# Patient Record
Sex: Female | Born: 1970 | Race: White | Hispanic: No | Smoking: Current every day smoker
Health system: Southern US, Community
[De-identification: ages and names within clinical notes are randomized; demographics above are authoritative.]

## PROBLEM LIST (undated history)

## (undated) DIAGNOSIS — E7800 Pure hypercholesterolemia, unspecified: Secondary | ICD-10-CM

## (undated) DIAGNOSIS — E669 Obesity, unspecified: Secondary | ICD-10-CM

## (undated) DIAGNOSIS — E119 Type 2 diabetes mellitus without complications: Secondary | ICD-10-CM

## (undated) DIAGNOSIS — F32A Depression, unspecified: Secondary | ICD-10-CM

## (undated) DIAGNOSIS — F329 Major depressive disorder, single episode, unspecified: Secondary | ICD-10-CM

## (undated) HISTORY — DX: Type 2 diabetes mellitus without complications: E11.9

## (undated) HISTORY — DX: Depression, unspecified: F32.A

## (undated) HISTORY — DX: Obesity, unspecified: E66.9

## (undated) HISTORY — PX: NO PAST SURGERIES: SHX2092

## (undated) HISTORY — DX: Pure hypercholesterolemia, unspecified: E78.00

## (undated) HISTORY — DX: Major depressive disorder, single episode, unspecified: F32.9

---

## 2006-05-04 ENCOUNTER — Ambulatory Visit: Payer: Self-pay | Admitting: Internal Medicine

## 2006-05-18 ENCOUNTER — Ambulatory Visit: Payer: Self-pay

## 2007-08-09 HISTORY — PX: LEEP: SHX91

## 2008-05-05 ENCOUNTER — Ambulatory Visit: Payer: Self-pay | Admitting: Cardiology

## 2008-05-12 ENCOUNTER — Ambulatory Visit: Payer: Self-pay

## 2008-05-12 ENCOUNTER — Encounter: Payer: Self-pay | Admitting: Cardiology

## 2008-05-29 ENCOUNTER — Ambulatory Visit: Payer: Self-pay | Admitting: Cardiology

## 2010-03-02 ENCOUNTER — Ambulatory Visit: Payer: Self-pay | Admitting: Family Medicine

## 2010-03-23 ENCOUNTER — Ambulatory Visit: Payer: Self-pay | Admitting: Family Medicine

## 2010-05-25 DIAGNOSIS — K219 Gastro-esophageal reflux disease without esophagitis: Secondary | ICD-10-CM | POA: Insufficient documentation

## 2010-05-25 DIAGNOSIS — I080 Rheumatic disorders of both mitral and aortic valves: Secondary | ICD-10-CM | POA: Insufficient documentation

## 2010-05-25 DIAGNOSIS — R002 Palpitations: Secondary | ICD-10-CM | POA: Insufficient documentation

## 2010-05-25 DIAGNOSIS — I359 Nonrheumatic aortic valve disorder, unspecified: Secondary | ICD-10-CM | POA: Insufficient documentation

## 2010-05-25 DIAGNOSIS — L659 Nonscarring hair loss, unspecified: Secondary | ICD-10-CM | POA: Insufficient documentation

## 2010-05-25 DIAGNOSIS — M1990 Unspecified osteoarthritis, unspecified site: Secondary | ICD-10-CM

## 2010-05-27 ENCOUNTER — Encounter: Payer: Self-pay | Admitting: Cardiology

## 2010-05-27 ENCOUNTER — Ambulatory Visit: Payer: Self-pay | Admitting: Cardiology

## 2010-08-20 ENCOUNTER — Telehealth (INDEPENDENT_AMBULATORY_CARE_PROVIDER_SITE_OTHER): Payer: Self-pay | Admitting: *Deleted

## 2010-09-07 NOTE — Assessment & Plan Note (Signed)
Summary: [facility]   Visit Type:  2 yr f/u Primary Provider:  Marlou Starks   History of Present Illness: Ms Holly Morales comes in today for a two-year evaluation of her aortic regurgitation and mitral regurgitation.  She is totally asymptomatic. She has been able to keep off 60 and 80 pounds she lost. She exercises on a regular basis. Denies any dyspnea on exertion, chest pain, presyncope or syncope. She's had no edema.  She only smokes when she is drinking. She has developed a much healthier lifestyle than in the past. She is still weight watchers and she teaches an exercise class.  Current Medications (verified): 1)  Birth Controll .... Take As Directed  Allergies (verified): No Known Drug Allergies  Past History:  Past Medical History: Last updated: 05/25/2010 CORONARY ARTERY DISEASE, FAMILY HX (ICD-V17.3) MITRAL REGURGITATION (ICD-396.3) AORTIC REGURGITATION (ICD-424.1) PALPITATIONS (ICD-785.1) GERD (ICD-530.81) ALOPECIA (ICD-704.00) OSTEOARTHRITIS (ICD-715.90)    Family History: Last updated: 05/25/2010  Mother is alive and well at 20.  Father is age 4, has  coronary artery disease and a stent.  Has a brother who is 47, with no known  coronary artery disease.  Social History: Last updated: 05/25/2010  She lives in [place] with her husband.  She does not  have any children.  She works as a Production manager for [employer].  She has a history  of tobacco 1 pack per day x15 years, quit in February 2003.  Alcohol:  Denies.  Review of Systems       negative other than history of present illness  Vital Signs:  Patient profile:   40 year old female Height:      69 inches Weight:      179.12 pounds BMI:     26.55 Pulse rate:   74 / minute Pulse rhythm:   regular BP sitting:   112 / 70  (left arm) Cuff size:   large  Vitals Entered By: Danielle Rankin, CMA (May 27, 2010 4:55 PM)  Physical Exam  General:  Well developed, well nourished, in no acute distress. Head:   normocephalic and atraumatic Eyes:  PERRLA/EOM intact; conjunctiva and lids normal. Neck:  Neck supple, no JVD. No masses, thyromegaly or abnormal cervical nodes. Chest Wall:  no deformities or breast masses noted Lungs:  Clear bilaterally to auscultation and percussion. Heart:  PMI nondisplaced, regular rate and rhythm, normal S1-S2, soft systolic murmur at the apex, no significant diastolic murmur heard along the left sternal border, carotids full without bruits Msk:  Back normal, normal gait. Muscle strength and tone normal. Pulses:  pulses normal in all 4 extremities Extremities:  No clubbing or cyanosis. Neurologic:  Alert and oriented x 3. Skin:  Intact without lesions or rashes. Psych:  Normal affect.   Impression & Recommendations:  Problem # 1:  MITRAL REGURGITATION (ICD-396.3) Assessment Unchanged By exam, this probably been little change. We'll quantitate with echocardiography. Same for the aortic valve. If stable follow up in 2 years. Orders: Echocardiogram (Echo)  Problem # 2:  AORTIC REGURGITATION (ICD-424.1) Assessment: Unchanged  Orders: Echocardiogram (Echo)  Problem # 3:  PALPITATIONS (ICD-785.1) Assessment: Improved  Problem # 4:  CORONARY ARTERY DISEASE, FAMILY HX (ICD-V17.3) Assessment: Unchanged   Patient Instructions: 1)  Your physician recommends that you schedule a follow-up appointment in: 2 years 2)  Your physician has requested that you have an echocardiogram.  Echocardiography is a painless test that uses sound waves to create images of your heart. It provides your doctor with information  about the size and shape of your heart and how well your heart's chambers and valves are working.  This procedure takes approximately one hour. There are no restrictions for this procedure.

## 2010-09-09 NOTE — Progress Notes (Signed)
  Phone Note Outgoing Call   Call placed by: Lisabeth Devoid RN,  August 20, 2010 10:09 AM Call placed to: Patient Summary of Call: ECHO  Follow-up for Phone Call        [facility] reminder to schedule ECHO. Follow-up by: Lisabeth Devoid RN,  August 20, 2010 10:09 AM

## 2010-09-13 ENCOUNTER — Other Ambulatory Visit (HOSPITAL_COMMUNITY): Payer: Self-pay

## 2010-10-20 ENCOUNTER — Emergency Department: Payer: Self-pay | Admitting: Emergency Medicine

## 2010-12-21 NOTE — Assessment & Plan Note (Signed)
[facility]                            CARDIOLOGY OFFICE NOTE   DEBI, COUSIN                          MRN:          [id number]  DATE:05/29/2008                            DOB:          Jan 16, 1971    [name] returns today for followup of her initial visit, May 05, 2008.   A event recorder showed sinus rhythm with palpitations.  She does note  that when she drinks a lot of caffeinated coffee that she does, feel  heart skipping.  She is now decaffed and this is resolved.   We obtained a 2-D echocardiogram which actually showed normal left  ventricular chamber size and no left ventricular hypertrophy.  Her EF is  60-70%.  She did have some mild aortic valve thickening and some mild  aortic valve regurgitation with some mild mitral valve regurgitation.  Her left atrial size was marginally increased at 43 mm.   I have reviewed all the findings with Holly Morales today.  I have made no  changes in her program.  We will see her back in 2 years for followup  visit and echo.  She has done remarkably well with therapeutic lifestyle  choices in her weight loss.  I think she has dedicated this for the  future.     Thomas C. Daleen Squibb, MD, [facility]  Electronically Signed    TCW/MedQ  DD: 05/29/2008  DT: 05/29/2008  Job #: [id number]

## 2010-12-21 NOTE — Assessment & Plan Note (Signed)
[facility]                            CARDIOLOGY OFFICE NOTE   RYVER, ZADROZNY                          MRN:          [id number]  DATE:05/05/2008                            DOB:          01/28/1971    Ms. Charma Mocarski is a delightfully 40 year old white female who comes in  today with palpitations.  She feels like her chest quivers right in the  center.  This can happen at rest or some time it happens with exertion.  It does not wake her from sleep.   She has had no syncope or presyncope.  She has had no chest tightness or  pressure.   She is Maily Debarge' daughter who is a patient of mine.  She saw Dr.  Andee Lineman for Dr. Dossie Arbour on May 04, 2006.  She had a stress Myoview  which showed no ischemia, EF of 66%.  She had a hypertensive blood  pressure response at that time.  Remarkably, since that time she has  dropped from 253 down to 171.  She feels remarkably better and her  exercise tolerance has improved dramatically.   She only takes birth control pills.  She denies any over-the-counter  stimulants.  She does not drink excess caffeine.   She does not smoke.  She does not drink.   PHYSICAL EXAMINATION:  VITAL SIGNS:  Her blood pressure today is 112/82,  her pulse is 72 and regular.  Her weight is 171.  HEENT:  Normocephalic and atraumatic.  PERRLA.  Extraocular movements  are intact.  Sclerae are clear.  Facial symmetry is normal.  Dentition  is satisfactory.  Oral mucosa is normal.  NECK:  Carotid upstrokes are equal bilaterally without bruits.  No JVD.  Thyroid is not enlarged.  Trachea is midline.  LUNGS:  Clear.  HEART:  Regular rate and rhythm.  No gallop.  ABDOMEN:  Soft, good bowel sounds.  No midline bruit.  No hepatomegaly.  EXTREMITIES:  No cyanosis, clubbing, or edema.  Pulses are intact.  NEURO:  Intact.   EKG that is obtained by Dr. Gala Romney was essentially normal with a  normal PR interval, QRS and QTc.   ASSESSMENT  AND PLAN:  1. Intermittent palpitations.  2. Dramatic weight loss within significant increase in functional      capacity.  3. Family history of coronary artery disease.   PLAN:  1. A 2-D echocardiogram to rule out any structural heart problem      including left ventricular hypertrophy.  2. Event recorder.   We will have her come back in 2-3 weeks to discuss this.   She had blood work with Dr. Dossie Arbour this spring.  We will make sure she  has a TSH.     Thomas C. Daleen Squibb, MD, [facility]  Electronically Signed    TCW/MedQ  DD: 05/05/2008  DT: 05/06/2008  Job #: [id number]   cc:   Vonita Moss, M.D.

## 2010-12-24 NOTE — Assessment & Plan Note (Signed)
[facility]                              CARDIOLOGY OFFICE NOTE   Holly, Morales                          MRN:          [id number]  DATE:05/04/2006                            DOB:          March 23, 1971    PRIMARY CARE PHYSICIAN:  Dr. Vonita Moss.   REASON FOR CONSULTATION:  Chest tightness.   PATIENT IDENTIFICATION:  [name] is a delightful 40 year old woman with a  history of anxiety as well as morbid obesity and borderline hypertension,  who presents for further evaluation of chest pain.   She tells me that several years ago she had an episode of severe chest pain.  She had an echocardiogram at that time which showed normal LV function, with  very mild mitral valve prolapse, without significant mitral regurgitation.  She was reassured at that time and told it was likely anxiety.  She did not  have a stress test or cardiac catheterization.  Over the past 2-3 months,  she has had recurrent chest pain.  It is now almost nearly on a daily basis.  She describes this as a chest tightness.  It can come and go any time.  It  can last for hours.  It is not worse with exertion.  It does seem to be  triggered by eating, but also can occur at other times.  She has tried  Pepcid AC with just mild relief.  She does note significant burning and  reflux symptoms.  She also has chronic dyspnea on exertion which has not  changed.  Finally, she also experienced some heart flutters about one time  per week, and it lasted about 10 seconds and disappeared.  There are no  associated symptoms.   REVIEW OF SYMPTOMS:  As per HPI and problem list.  Otherwise, all systems  are normal.   PAST MEDICAL HISTORY:  1. Chest pain.      a.     Echocardiogram in 2003, which was normal.  There was very mild       mitral valve prolapse, without significant mitral regurgitation.  2. Morbid obesity.  3. Gastroesophageal reflux disease.  4. Allergies.  5. Osteoarthritis.  6. Alopecia.   CURRENT MEDICATIONS:  1. Allegra 180 a day.  2. Pepcid AC 1 tablet a day.  3. Glucosamine 1 tablet t.i.d.  4. Apri birth control pills.   ALLERGIES:  No known drug allergies.   SOCIAL HISTORY:  She lives in [place] with her husband.  She does not  have any children.  She works as a Production manager for [employer].  She has a history  of tobacco 1 pack per day x15 years, quit in February 2003.  Alcohol:  Denies.   FAMILY HISTORY:  Mother is alive and well at 3.  Father is age 75, has  coronary artery disease and a stent.  Has a brother who is 25, with no known  coronary artery disease.   PHYSICAL EXAMINATION:  GENERAL:  She is an obese woman in no acute distress.  Respirations are unlabored.  She ambulates around the clinic  without  difficulty.  VITAL SIGNS:  Blood pressure is 142/82, heart rate is 92, weight is 261.  HEENT:  Sclerae anicteric.  EOMI.  There is no xanthelasma.  Mucous  membranes are moist.  NECK:  Supple.  Thick.  Unable to assess JVD.  Carotids are 2+ bilaterally,  without bruits.  There is no lymphadenopathy or thyromegaly.  CARDIAC:  She has distant heart sounds.  She has a regular rate and rhythm,  with no significant murmurs, rubs, or gallops.  LUNGS:  Clear.  There is no pain on palpation of her chest wall.  ABDOMEN:  Obese, nontender, nondistended.  There is no right upper quadrant  or epigastric abnormality.  Good bowel sounds.  No bruits.  No masses.  EXTREMITIES:  Warm, with no cyanosis, clubbing, or edema.  DP pulses are 2+  bilaterally.  There are no rashes or arthralgias.  NEUROLOGIC:  She is alert and oriented x3.  Cranial nerves II-XII are  intact.  Moves all four extremities without difficulties.  Affect is quite  bright.   EKG shows normal sinus rhythm, at a rate of 92.  QRS duration is normal at  84 msec.  There are no ST-T wave changes.   ASSESSMENT AND PLAN:  1. Chest pain.  This is fairly atypical.  I think it is more classic for       gastroesophageal reflux disease.  We will switch her over from Pepcid      [facility] to Protonix 40 mg a day.  Given her risk factors, I do think it is      reasonable to check a Myoview, and we will check a baseline exercise      test on her next week.  2. Palpitations.  I suspect these are just mild atrial dysrhythmias.  We      will get a CardioNet monitor to further evaluation.  3. Prevention.  I had a long talk with her regarding her obesity,      sedentary lifestyle, and hypertension.  I told her that she was at high      risk for the development of sleep apnea, hypertension, and diabetes and      related complications.  I have asked her to consider joining Weight      Watchers as well as beginning a routine exercise program to avoid these      complications.   DISPOSITION:  We will see her back in clinic on a p.r.n. basis.  We will  contact her with the results of her testing.       Bevelyn Buckles. Bensimhon, MD     DRB/MedQ  DD:  05/04/2006  DT:  05/05/2006  Job #:  [id number]   cc:   Vonita Moss, M.D.

## 2011-06-14 ENCOUNTER — Ambulatory Visit: Payer: Self-pay | Admitting: Family Medicine

## 2012-07-26 ENCOUNTER — Ambulatory Visit: Payer: Self-pay | Admitting: Family Medicine

## 2013-09-23 ENCOUNTER — Ambulatory Visit: Payer: Self-pay | Admitting: Family Medicine

## 2014-07-15 ENCOUNTER — Ambulatory Visit: Payer: Self-pay

## 2014-09-24 ENCOUNTER — Ambulatory Visit: Payer: Self-pay | Admitting: Family Medicine

## 2014-10-10 ENCOUNTER — Ambulatory Visit
Admit: 2014-10-10 | Disposition: A | Discharge status: Home / Self Care | Payer: Self-pay | Attending: Family Medicine | Admitting: Family Medicine

## 2014-11-07 ENCOUNTER — Ambulatory Visit
Admit: 2014-11-07 | Disposition: A | Discharge status: Home / Self Care | Payer: Self-pay | Attending: Family Medicine | Admitting: Family Medicine

## 2014-12-09 ENCOUNTER — Ambulatory Visit: Payer: Self-pay | Admitting: Dietician

## 2015-01-02 ENCOUNTER — Encounter: Payer: Self-pay | Admitting: Dietician

## 2015-01-02 ENCOUNTER — Encounter: Payer: BC Managed Care – PPO | Attending: Family Medicine | Admitting: Dietician

## 2015-01-02 VITALS — Ht 69.0 in | Wt 224.0 lb

## 2015-01-02 DIAGNOSIS — E119 Type 2 diabetes mellitus without complications: Secondary | ICD-10-CM | POA: Diagnosis not present

## 2015-01-02 NOTE — Patient Instructions (Signed)
Pt.focus on portion control for evening meal. To limit evening snack to 15-20 gms of carbohydrate. To limit carbohydrate servings to 2-3 per meal with range of 9-10 per day. To walk for exercise 3 days per week for 30 minutes.

## 2015-01-02 NOTE — Progress Notes (Signed)
Medical Nutrition Therapy Follow-up visit:  Time:8:15- 8:45am Visit #:3 ASSESSMENT:  Diagnosis:Type 2 Diabetes, obesity  Current weight:224    Height:59in Medications: See list   Progress and evaluation:Wt. Loss of 1.4 lbs since previous visit 8 weeks ago. Pt. States she has "not done well" regarding her food choices in past month. Her problem areas are larger portions at evening meal as higher fat choices. She is also eating "sweets", usually cookies for evening snack. She doesn't however snack at other times, rarely eats out for evening meal, takes sandwich or leftovers for lunch and drinks mainly water or Crystal Lite. She reports her last HgA1c had decreased from 6.8 to 6.2. She has noticed that most recent FBG's have been in the 140's which she attributes to late night snacking.  Physical activity:none NUTRITION CARE EDUCATION: Weight control: Reviewed portion control and strategies when "out of town" to maintain weight during those times. Discussed how improving diet is a process and stressed how she has lost 1.4 lbs despite resuming some previous habits. Diabetes:  Reviewed 1500 calorie meal plan instructed on at initial visit. Stressed the significance of improved HgA1c and how this can be a motivator to resume positive diet changes. Other lifestyle changes:  benefits of making changes, increasing motivation, readiness for change, identifying habits that need to change, NUTRITIONAL DIAGNOSIS:  NI-5.8.2 Excessive carbohydrate intake As related to large dinner portions and late night snacking.  As evidenced by diet history. INTERVENTION:  Pt. To limit evening meal to 2-3 servings of carbohydrate. To limit evening snack to 15 gms carbohdyrate. To walk for exercise 3 days/week for 30 minutes. EDUCATION MATERIALS GIVEN:  . Goals/ instructions  LEARNER/ who was taught:  . Patient   LEVEL OF UNDERSTANDING: . Partial understanding; needs review/ practice LEARNING  BARRIERS: . None  WILLINGNESS TO LEARN/READINESS FOR CHANGE: . Eager, change in progress MONITORING AND EVALUATION:  Weight, dietary intake Follow-up: Pt. To schedule after her next MD appt. To be scheduled in July or Aug. '16

## 2015-01-21 ENCOUNTER — Other Ambulatory Visit: Payer: Self-pay | Admitting: Family Medicine

## 2015-05-12 ENCOUNTER — Ambulatory Visit (INDEPENDENT_AMBULATORY_CARE_PROVIDER_SITE_OTHER): Payer: BC Managed Care – PPO | Admitting: Family Medicine

## 2015-05-12 ENCOUNTER — Encounter: Payer: Self-pay | Admitting: Family Medicine

## 2015-05-12 VITALS — BP 114/79 | HR 82 | Temp 98.8°F | Ht 66.5 in | Wt 232.0 lb

## 2015-05-12 DIAGNOSIS — E119 Type 2 diabetes mellitus without complications: Secondary | ICD-10-CM | POA: Diagnosis not present

## 2015-05-12 DIAGNOSIS — E1140 Type 2 diabetes mellitus with diabetic neuropathy, unspecified: Secondary | ICD-10-CM | POA: Insufficient documentation

## 2015-05-12 DIAGNOSIS — E669 Obesity, unspecified: Secondary | ICD-10-CM | POA: Insufficient documentation

## 2015-05-12 LAB — MICROALBUMIN, URINE WAIVED
CREATININE, URINE WAIVED: 50 mg/dL (ref 10–300)
MICROALB, UR WAIVED: 10 mg/L (ref 0–19)

## 2015-05-12 LAB — BAYER DCA HB A1C WAIVED: HB A1C (BAYER DCA - WAIVED): 7.2 % — ABNORMAL HIGH (ref <7.0)

## 2015-05-12 MED ORDER — METFORMIN HCL 500 MG PO TABS: 500.0000 mg | ORAL_TABLET | Freq: Two times a day (BID) | ORAL | Status: DC

## 2015-05-12 NOTE — Assessment & Plan Note (Signed)
Discussed diabetes primary treatment with lifestyle weight loss. Of course encouraged to quit smoking Will increase metformin to 500 twice a day.

## 2015-05-12 NOTE — Progress Notes (Signed)
   BP 114/79 mmHg  Pulse 82  Temp(Src) 98.8 F (37.1 C)  Ht 5' 6.5" (1.689 m)  Wt 232 lb (105.235 kg)  BMI 36.89 kg/m2  SpO2 98%  LMP 02/23/2015 (Approximate)   Subjective:    Patient ID: Holly Morales, female    DOB: [birth year], 44 y.o.   MRN: [id number]  HPI: Holly Morales is a 44 y.o. female  Chief Complaint  Patient presents with  . Diabetes   Patient follow-up diabetes has done worse with 8 pounds of weight gain this summer. Patient is been on a see food diet.  Taking metformin without problems noted low blood sugar spells Still has hip bursitis takes ibuprofen 800 mg an hour before bedtime one a day. Still taking birth control pills without problems has had scant periods. Patient still smoking aware of increased risk Relevant past medical, surgical, family and social history reviewed and updated as indicated. Interim medical history since our last visit reviewed. Allergies and medications reviewed and updated.  Review of Systems  Constitutional: Negative.   Respiratory: Negative.   Cardiovascular: Negative.     Per HPI unless specifically indicated above     Objective:    BP 114/79 mmHg  Pulse 82  Temp(Src) 98.8 F (37.1 C)  Ht 5' 6.5" (1.689 m)  Wt 232 lb (105.235 kg)  BMI 36.89 kg/m2  SpO2 98%  LMP 02/23/2015 (Approximate)  Wt Readings from Last 3 Encounters:  05/12/15 232 lb (105.235 kg)  12/11/14 225 lb (102.059 kg)  01/02/15 224 lb (101.606 kg)    Physical Exam  Constitutional: She is oriented to person, place, and time. She appears well-developed and well-nourished. No distress.  HENT:  Head: Normocephalic and atraumatic.  Right Ear: Hearing normal.  Left Ear: Hearing normal.  Nose: Nose normal.  Eyes: Conjunctivae and lids are normal. Right eye exhibits no discharge. Left eye exhibits no discharge. No scleral icterus.  Cardiovascular: Normal rate, regular rhythm and normal heart sounds.   Pulmonary/Chest: Effort normal and breath sounds  normal. No respiratory distress.  Musculoskeletal: Normal range of motion.  Neurological: She is alert and oriented to person, place, and time.  Skin: Skin is intact. No rash noted.  Psychiatric: She has a normal mood and affect. Her speech is normal and behavior is normal. Judgment and thought content normal. Cognition and memory are normal.    No results found for this or any previous visit.    Assessment & Plan:   Problem List Items Addressed This Visit      Endocrine   Diabetes mellitus without complication (HCC) - Primary    Discussed diabetes primary treatment with lifestyle weight loss. Of course encouraged to quit smoking Will increase metformin to 500 twice a day.      Relevant Medications   metFORMIN (GLUCOPHAGE) 500 MG tablet   Other Relevant Orders   Bayer DCA Hb A1c Waived   Microalbumin, Urine Waived     discuss hip bursitis occasionally not taken Motrin.  Follow up plan: Return in about 3 months (around 08/12/2015) for Physical Exam a1c.

## 2015-09-01 ENCOUNTER — Other Ambulatory Visit: Payer: Self-pay | Admitting: Family Medicine

## 2015-09-01 DIAGNOSIS — Z1231 Encounter for screening mammogram for malignant neoplasm of breast: Secondary | ICD-10-CM

## 2015-10-01 ENCOUNTER — Ambulatory Visit
Admission: RE | Admit: 2015-10-01 | Discharge: 2015-10-01 | Disposition: A | Discharge status: Home / Self Care | Payer: BC Managed Care – PPO | Source: Ambulatory Visit | Attending: Family Medicine | Admitting: Family Medicine

## 2015-10-01 ENCOUNTER — Encounter: Payer: Self-pay | Admitting: Family Medicine

## 2015-10-01 ENCOUNTER — Other Ambulatory Visit: Payer: Self-pay | Admitting: Family Medicine

## 2015-10-01 ENCOUNTER — Ambulatory Visit (INDEPENDENT_AMBULATORY_CARE_PROVIDER_SITE_OTHER): Payer: BC Managed Care – PPO | Admitting: Family Medicine

## 2015-10-01 VITALS — BP 137/82 | HR 83 | Temp 98.6°F | Ht 67.0 in | Wt 227.0 lb

## 2015-10-01 DIAGNOSIS — Z1231 Encounter for screening mammogram for malignant neoplasm of breast: Secondary | ICD-10-CM | POA: Insufficient documentation

## 2015-10-01 DIAGNOSIS — Z0000 Encounter for general adult medical examination without abnormal findings: Secondary | ICD-10-CM

## 2015-10-01 DIAGNOSIS — E119 Type 2 diabetes mellitus without complications: Secondary | ICD-10-CM | POA: Diagnosis not present

## 2015-10-01 LAB — MICROSCOPIC EXAMINATION: WBC, UA: NONE SEEN /hpf (ref 0–?)

## 2015-10-01 LAB — URINALYSIS, ROUTINE W REFLEX MICROSCOPIC
BILIRUBIN UA: NEGATIVE
GLUCOSE, UA: NEGATIVE
KETONES UA: NEGATIVE
LEUKOCYTES UA: NEGATIVE
Nitrite, UA: NEGATIVE
PROTEIN UA: NEGATIVE
Urobilinogen, Ur: 0.2 mg/dL (ref 0.2–1.0)
pH, UA: 6 (ref 5.0–7.5)

## 2015-10-01 LAB — BAYER DCA HB A1C WAIVED: HB A1C (BAYER DCA - WAIVED): 7.7 % — ABNORMAL HIGH (ref <7.0)

## 2015-10-01 MED ORDER — METFORMIN HCL 500 MG PO TABS: 1000.0000 mg | ORAL_TABLET | Freq: Two times a day (BID) | ORAL | Status: DC

## 2015-10-01 MED ORDER — IBUPROFEN 800 MG PO TABS: 800.0000 mg | ORAL_TABLET | Freq: Three times a day (TID) | ORAL | Status: DC

## 2015-10-01 NOTE — Assessment & Plan Note (Signed)
Patient's A1c elevated and moving up from last visit Will increase metformin to 1000 twice a day discussed diet nutrition exercise.

## 2015-10-01 NOTE — Addendum Note (Signed)
Addended by: Bennetta Laos H on: 10/01/2015 11:26 AM   Modules accepted: Kipp Brood

## 2015-10-01 NOTE — Progress Notes (Signed)
BP 137/82 mmHg  Pulse 83  Temp(Src) 98.6 F (37 C)  Ht  (1.702 m)  Wt 227 lb (102.967 kg)  BMI 35.55 kg/m2  SpO2 96%  LMP 09/16/2015 (Exact Date)   Subjective:    Patient ID: Holly Morales, female    DOB: 10-11-1970, 45 y.o.   MRN: [id number]  HPI: Holly Morales is a 45 y.o. female  Chief Complaint  Patient presents with  . Annual Exam   patient follow-up has been doing well blood sugars mostly in the low 100s sometimes excursions up to 200 Weight is been stable fluctuating plus -5 pounds No low blood sugar spells does have some intermittent headaches Takes ibuprofen for these headaches plus her osteoarthritis of her back and hips. No symptoms from mitral and aortic regurgitation Just had Pap smear and breast exam earlier this week has mammogram scheduled today.  Relevant past medical, surgical, family and social history reviewed and updated as indicated. Interim medical history since our last visit reviewed. Allergies and medications reviewed and updated.  Review of Systems  Constitutional: Negative.   HENT: Negative.   Eyes: Negative.   Respiratory: Negative.   Cardiovascular: Negative.   Gastrointestinal: Negative.   Endocrine: Negative.   Genitourinary: Negative.   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 Ibuprofen helps mostly.  Skin: Negative.   Allergic/Immunologic: Negative.   Neurological: Negative.   Hematological: Negative.   Psychiatric/Behavioral: Negative.     Per HPI unless specifically indicated above     Objective:    BP 137/82 mmHg  Pulse 83  Temp(Src) 98.6 F (37 C)  Ht  (1.702 m)  Wt 227 lb (102.967 kg)  BMI 35.55 kg/m2  SpO2 96%  LMP 09/16/2015 (Exact Date)  Wt Readings from Last 3 Encounters:  10/01/15 227 lb (102.967 kg)  05/12/15 232 lb (105.235 kg)  12/11/14 225 lb (102.059 kg)    Physical Exam   Constitutional: She is oriented to person, place, and time. She appears well-developed and well-nourished.  HENT:  Head: Normocephalic and atraumatic.  Right Ear: External ear normal.  Left Ear: External ear normal.  Nose: Nose normal.  Mouth/Throat: Oropharynx is clear and moist.  Eyes: Conjunctivae and EOM are normal. Pupils are equal, round, and reactive to light.  Neck: Normal range of motion. Neck supple. Carotid bruit is not present.  Cardiovascular: Normal rate, regular rhythm and normal heart sounds.   No murmur heard. Pulmonary/Chest: Effort normal and breath sounds normal.  Abdominal: Soft. Bowel sounds are normal. There is no hepatosplenomegaly.  Musculoskeletal: Normal range of motion.  Neurological: She is alert and oriented to person, place, and time.  Skin: No rash noted.  Psychiatric: She has a normal mood and affect. Her behavior is normal. Judgment and thought content normal.    Results for orders placed or performed in visit on 05/12/15  Bayer DCA Hb A1c Waived  Result Value Ref Range   Bayer DCA Hb A1c Waived 7.2 (H) <7.0 %  Microalbumin, Urine Waived  Result Value Ref Range   Microalb, Ur Waived 10 0 - 19 mg/L   Creatinine, Urine Waived 50 10 - 300 mg/dL   Microalb/Creat Ratio 30-300 (H) <30 mg/g      Assessment & Plan:   Problem List Items Addressed This Visit      Endocrine   Diabetes mellitus  without complication (HCC)    Patient's A1c elevated and moving up from last visit Will increase metformin to 1000 twice a day discussed diet nutrition exercise.      Relevant Medications   metFORMIN (GLUCOPHAGE) 500 MG tablet   Other Relevant Orders   Bayer DCA Hb A1c Waived    Other Visit Diagnoses    Routine general medical examination at a health care facility    -  Primary    Relevant Orders    Comprehensive metabolic panel    Lipid panel    CBC with Differential/Platelet    TSH    Urinalysis, Routine w reflex microscopic (not at [facility])         Follow up plan: Return in about 3 months (around 12/29/2015) for a1c.

## 2015-10-02 LAB — COMPREHENSIVE METABOLIC PANEL
A/G RATIO: 1.5 (ref 1.1–2.5)
ALT: 20 IU/L (ref 0–32)
AST: 17 IU/L (ref 0–40)
Albumin: 4.3 g/dL (ref 3.5–5.5)
Alkaline Phosphatase: 82 IU/L (ref 39–117)
BUN/Creatinine Ratio: 16 (ref 9–23)
BUN: 10 mg/dL (ref 6–24)
CALCIUM: 9.5 mg/dL (ref 8.7–10.2)
CHLORIDE: 101 mmol/L (ref 96–106)
CO2: 23 mmol/L (ref 18–29)
Creatinine, Ser: 0.63 mg/dL (ref 0.57–1.00)
GFR, EST AFRICAN AMERICAN: 124 mL/min/{1.73_m2} (ref >59)
GFR, EST NON AFRICAN AMERICAN: 108 mL/min/{1.73_m2} (ref >59)
GLOBULIN, TOTAL: 2.8 g/dL (ref 1.5–4.5)
Glucose: 155 mg/dL — ABNORMAL HIGH (ref 65–99)
POTASSIUM: 4.7 mmol/L (ref 3.5–5.2)
SODIUM: 138 mmol/L (ref 134–144)
Total Protein: 7.1 g/dL (ref 6.0–8.5)

## 2015-10-02 LAB — CBC WITH DIFFERENTIAL/PLATELET
BASOS: 0 %
Basophils Absolute: 0 10*3/uL (ref 0.0–0.2)
EOS (ABSOLUTE): 0.2 10*3/uL (ref 0.0–0.4)
EOS: 2 %
HEMATOCRIT: 41.7 % (ref 34.0–46.6)
Hemoglobin: 13.6 g/dL (ref 11.1–15.9)
IMMATURE GRANS (ABS): 0 10*3/uL (ref 0.0–0.1)
IMMATURE GRANULOCYTES: 1 %
LYMPHS: 26 %
Lymphocytes Absolute: 2.3 10*3/uL (ref 0.7–3.1)
MCH: 31.2 pg (ref 26.6–33.0)
MCHC: 32.6 g/dL (ref 31.5–35.7)
MCV: 96 fL (ref 79–97)
MONOS ABS: 0.7 10*3/uL (ref 0.1–0.9)
Monocytes: 8 %
NEUTROS ABS: 5.7 10*3/uL (ref 1.4–7.0)
NEUTROS PCT: 63 %
Platelets: 269 10*3/uL (ref 150–379)
RBC: 4.36 x10E6/uL (ref 3.77–5.28)
RDW: 12.9 % (ref 12.3–15.4)
WBC: 8.9 10*3/uL (ref 3.4–10.8)

## 2015-10-02 LAB — LIPID PANEL W/O CHOL/HDL RATIO
Cholesterol, Total: 175 mg/dL (ref 100–199)
HDL: 36 mg/dL — AB (ref >39)
LDL CALC: 114 mg/dL — AB (ref 0–99)
TRIGLYCERIDES: 125 mg/dL (ref 0–149)
VLDL Cholesterol Cal: 25 mg/dL (ref 5–40)

## 2015-10-02 LAB — TSH: TSH: 3.84 u[IU]/mL (ref 0.450–4.500)

## 2015-10-05 ENCOUNTER — Encounter: Payer: Self-pay | Admitting: Family Medicine

## 2015-11-27 ENCOUNTER — Ambulatory Visit (INDEPENDENT_AMBULATORY_CARE_PROVIDER_SITE_OTHER): Payer: BC Managed Care – PPO | Admitting: Unknown Physician Specialty

## 2015-11-27 ENCOUNTER — Encounter: Payer: Self-pay | Admitting: Unknown Physician Specialty

## 2015-11-27 VITALS — BP 141/79 | HR 91 | Temp 99.0°F | Ht 66.3 in | Wt 227.0 lb

## 2015-11-27 DIAGNOSIS — I152 Hypertension secondary to endocrine disorders: Secondary | ICD-10-CM | POA: Insufficient documentation

## 2015-11-27 DIAGNOSIS — I10 Essential (primary) hypertension: Secondary | ICD-10-CM | POA: Diagnosis not present

## 2015-11-27 MED ORDER — LISINOPRIL 10 MG PO TABS: 10.0000 mg | ORAL_TABLET | Freq: Every day | ORAL | Status: DC

## 2015-11-27 NOTE — Assessment & Plan Note (Addendum)
Start Lisinopril 10 mg QD.  Side effects discussed.  Get EKG.  New diagnosis.  Continue diabetic diet.  DASH diet given

## 2015-11-27 NOTE — Progress Notes (Signed)
BP 141/79 mmHg  Pulse 91  Temp(Src) 99 F (37.2 C)  Ht 5' 6.3" (1.684 m)  Wt 227 lb (102.967 kg)  BMI 36.31 kg/m2  SpO2 98%  LMP 11/18/2015 (Exact Date)   Subjective:    Patient ID: Holly Morales, female    DOB: 1971/05/08, 45 y.o.   MRN: [id number]  HPI: Holly Morales is a 45 y.o. female  Chief Complaint  Patient presents with  . Hypertension    pt states her BP has been up for about 3 weeks now, has a list of values she has been keeping up with   Hypertension Noted BP non the high side for the last 3 weeks.  She does think a lot of this is due to stress due to finances.  She is getting headaches.  No SOB or chest pain but whole body has ached and hurt, particularly her back.  Her BP is typically 150-154/94-11.  He does have one value that is 114/93.  She does seem to take a lot of Ibuprofen.    Social History   Social History  . Marital Status: Married    Spouse Name: N/A  . Number of Children: N/A  . Years of Education: N/A   Occupational History  . Not on file.   Social History Main Topics  . Smoking status: Current Every Day Smoker -- 0.50 packs/day    Types: Cigarettes  . Smokeless tobacco: Never Used  . Alcohol Use: 0.6 oz/week    1 Standard drinks or equivalent per week     Comment: on occasion  . Drug Use: No  . Sexual Activity: Not on file   Other Topics Concern  . Not on file   Social History Narrative     Relevant past medical, surgical, family and social history reviewed and updated as indicated. Interim medical history since our last visit reviewed. Allergies and medications reviewed and updated.  Review of Systems  Per HPI unless specifically indicated above     Objective:    BP 141/79 mmHg  Pulse 91  Temp(Src) 99 F (37.2 C)  Ht 5' 6.3" (1.684 m)  Wt 227 lb (102.967 kg)  BMI 36.31 kg/m2  SpO2 98%  LMP 11/18/2015 (Exact Date)  Wt Readings from Last 3 Encounters:  11/27/15 227 lb (102.967 kg)  10/01/15 227 lb (102.967 kg)   05/12/15 232 lb (105.235 kg)    Physical Exam  Constitutional: She is oriented to person, place, and time. She appears well-developed and well-nourished. No distress.  HENT:  Head: Normocephalic and atraumatic.  Eyes: Conjunctivae and lids are normal. Right eye exhibits no discharge. Left eye exhibits no discharge. No scleral icterus.  Neck: Normal range of motion. Neck supple. No JVD present. Carotid bruit is not present.  Cardiovascular: Normal rate, regular rhythm and normal heart sounds.   Pulmonary/Chest: Effort normal and breath sounds normal.  Abdominal: Normal appearance. There is no splenomegaly or hepatomegaly.  Musculoskeletal: Normal range of motion.  Neurological: She is alert and oriented to person, place, and time.  Skin: Skin is warm, dry and intact. No rash noted. No pallor.  Psychiatric: She has a normal mood and affect. Her behavior is normal. Judgment and thought content normal.   EKG is normal  Results for orders placed or performed in visit on 10/01/15  Microscopic Examination  Result Value Ref Range   WBC, UA None seen 0 -  5 /hpf   RBC, UA 0-2 0 -  2 /hpf  Epithelial Cells (non renal) 0-10 0 - 10 /hpf   Bacteria, UA Few None seen/Few  Urinalysis, Routine w reflex microscopic (not at [facility])  Result Value Ref Range   Specific Gravity, UA <1.005 (L) 1.005 - 1.030   pH, UA 6.0 5.0 - 7.5   Color, UA Yellow Yellow   Appearance Ur Clear Clear   Leukocytes, UA Negative Negative   Protein, UA Negative Negative/Trace   Glucose, UA Negative Negative   Ketones, UA Negative Negative   RBC, UA Trace (A) Negative   Bilirubin, UA Negative Negative   Urobilinogen, Ur 0.2 0.2 - 1.0 mg/dL   Nitrite, UA Negative Negative   Microscopic Examination See below:   Bayer DCA Hb A1c Waived  Result Value Ref Range   Bayer DCA Hb A1c Waived 7.7 (H) <7.0 %  CBC with Differential/Platelet  Result Value Ref Range   WBC 8.9 3.4 - 10.8 x10E3/uL   RBC 4.36 3.77 - 5.28 x10E6/uL    Hemoglobin 13.6 11.1 - 15.9 g/dL   Hematocrit 16.1 09.6 - 46.6 %   MCV 96 79 - 97 fL   MCH 31.2 26.6 - 33.0 pg   MCHC 32.6 31.5 - 35.7 g/dL   RDW 04.5 40.9 - 81.1 %   Platelets 269 150 - 379 x10E3/uL   Neutrophils 63 %   Lymphs 26 %   Monocytes 8 %   Eos 2 %   Basos 0 %   Neutrophils Absolute 5.7 1.4 - 7.0 x10E3/uL   Lymphocytes Absolute 2.3 0.7 - 3.1 x10E3/uL   Monocytes Absolute 0.7 0.1 - 0.9 x10E3/uL   EOS (ABSOLUTE) 0.2 0.0 - 0.4 x10E3/uL   Basophils Absolute 0.0 0.0 - 0.2 x10E3/uL   Immature Granulocytes 1 %   Immature Grans (Abs) 0.0 0.0 - 0.1 x10E3/uL  Comprehensive metabolic panel  Result Value Ref Range   Glucose 155 (H) 65 - 99 mg/dL   BUN 10 6 - 24 mg/dL   Creatinine, Ser 9.14 0.57 - 1.00 mg/dL   GFR calc non Af Amer 108 >59 mL/min/1.73   GFR calc Af Amer 124 >59 mL/min/1.73   BUN/Creatinine Ratio 16 9 - 23   Sodium 138 134 - 144 mmol/L   Potassium 4.7 3.5 - 5.2 mmol/L   Chloride 101 96 - 106 mmol/L   CO2 23 18 - 29 mmol/L   Calcium 9.5 8.7 - 10.2 mg/dL   Total Protein 7.1 6.0 - 8.5 g/dL   Albumin 4.3 3.5 - 5.5 g/dL   Globulin, Total 2.8 1.5 - 4.5 g/dL   Albumin/Globulin Ratio 1.5 1.1 - 2.5   Bilirubin Total <0.2 0.0 - 1.2 mg/dL   Alkaline Phosphatase 82 39 - 117 IU/L   AST 17 0 - 40 IU/L   ALT 20 0 - 32 IU/L  Lipid Panel w/o Chol/HDL Ratio  Result Value Ref Range   Cholesterol, Total 175 100 - 199 mg/dL   Triglycerides 782 0 - 149 mg/dL   HDL 36 (L) >95 mg/dL   VLDL Cholesterol Cal 25 5 - 40 mg/dL   LDL Calculated 621 (H) 0 - 99 mg/dL  TSH  Result Value Ref Range   TSH 3.840 0.450 - 4.500 uIU/mL      Assessment & Plan:   Problem List Items Addressed This Visit      Unprioritized   Hypertension - Primary    Start Lisinopril 10 mg QD.  Side effects discussed.  Get EKG.  New diagnosis.  Continue diabetic diet.  DASH  diet given      Relevant Medications   lisinopril (PRINIVIL,ZESTRIL) 10 MG tablet   Other Relevant Orders   EKG 12-Lead  (Completed)       Follow up plan: Return for with Dr. Dossie Arbour at regular appt in about 6 weeks.

## 2015-11-27 NOTE — Patient Instructions (Signed)
DASH Eating Plan  DASH stands for "Dietary Approaches to Stop Hypertension." The DASH eating plan is a healthy eating plan that has been shown to reduce high blood pressure (hypertension). Additional health benefits may include reducing the risk of type 2 diabetes mellitus, heart disease, and stroke. The DASH eating plan may also help with weight loss.  WHAT DO I NEED TO KNOW ABOUT THE DASH EATING PLAN?  For the DASH eating plan, you will follow these general guidelines:  · Choose foods with a percent daily value for sodium of less than 5% (as listed on the food label).  · Use salt-free seasonings or herbs instead of table salt or sea salt.  · Check with your health care provider or pharmacist before using salt substitutes.  · Eat lower-sodium products, often labeled as "lower sodium" or "no salt added."  · Eat fresh foods.  · Eat more vegetables, fruits, and low-fat dairy products.  · Choose whole grains. Look for the word "whole" as the first word in the ingredient list.  · Choose fish and skinless chicken or turkey more often than red meat. Limit fish, poultry, and meat to 6 oz (170 g) each day.  · Limit sweets, desserts, sugars, and sugary drinks.  · Choose heart-healthy fats.  · Limit cheese to 1 oz (28 g) per day.  · Eat more home-cooked food and less restaurant, buffet, and fast food.  · Limit fried foods.  · Cook foods using methods other than frying.  · Limit canned vegetables. If you do use them, rinse them well to decrease the sodium.  · When eating at a restaurant, ask that your food be prepared with less salt, or no salt if possible.  WHAT FOODS CAN I EAT?  Seek help from a dietitian for individual calorie needs.  Grains  Whole grain or whole wheat bread. Brown rice. Whole grain or whole wheat pasta. Quinoa, bulgur, and whole grain cereals. Low-sodium cereals. Corn or whole wheat flour tortillas. Whole grain cornbread. Whole grain crackers. Low-sodium crackers.  Vegetables  Fresh or frozen vegetables  (raw, steamed, roasted, or grilled). Low-sodium or reduced-sodium tomato and vegetable juices. Low-sodium or reduced-sodium tomato sauce and paste. Low-sodium or reduced-sodium canned vegetables.   Fruits  All fresh, canned (in natural juice), or frozen fruits.  Meat and Other Protein Products  Ground beef (85% or leaner), grass-fed beef, or beef trimmed of fat. Skinless chicken or turkey. Ground chicken or turkey. Pork trimmed of fat. All fish and seafood. Eggs. Dried beans, peas, or lentils. Unsalted nuts and seeds. Unsalted canned beans.  Dairy  Low-fat dairy products, such as skim or 1% milk, 2% or reduced-fat cheeses, low-fat ricotta or cottage cheese, or plain low-fat yogurt. Low-sodium or reduced-sodium cheeses.  Fats and Oils  Tub margarines without trans fats. Light or reduced-fat mayonnaise and salad dressings (reduced sodium). Avocado. Safflower, olive, or canola oils. Natural peanut or almond butter.  Other  Unsalted popcorn and pretzels.  The items listed above may not be a complete list of recommended foods or beverages. Contact your dietitian for more options.  WHAT FOODS ARE NOT RECOMMENDED?  Grains  White bread. White pasta. White rice. Refined cornbread. Bagels and croissants. Crackers that contain trans fat.  Vegetables  Creamed or fried vegetables. Vegetables in a cheese sauce. Regular canned vegetables. Regular canned tomato sauce and paste. Regular tomato and vegetable juices.  Fruits  Dried fruits. Canned fruit in light or heavy syrup. Fruit juice.  Meat and Other Protein   Products  Fatty cuts of meat. Ribs, chicken wings, bacon, sausage, bologna, salami, chitterlings, fatback, hot dogs, bratwurst, and packaged luncheon meats. Salted nuts and seeds. Canned beans with salt.  Dairy  Whole or 2% milk, cream, half-and-half, and cream cheese. Whole-fat or sweetened yogurt. Full-fat cheeses or blue cheese. Nondairy creamers and whipped toppings. Processed cheese, cheese spreads, or cheese  curds.  Condiments  Onion and garlic salt, seasoned salt, table salt, and sea salt. Canned and packaged gravies. Worcestershire sauce. Tartar sauce. Barbecue sauce. Teriyaki sauce. Soy sauce, including reduced sodium. Steak sauce. Fish sauce. Oyster sauce. Cocktail sauce. Horseradish. Ketchup and mustard. Meat flavorings and tenderizers. Bouillon cubes. Hot sauce. Tabasco sauce. Marinades. Taco seasonings. Relishes.  Fats and Oils  Butter, stick margarine, lard, shortening, ghee, and bacon fat. Coconut, palm kernel, or palm oils. Regular salad dressings.  Other  Pickles and olives. Salted popcorn and pretzels.  The items listed above may not be a complete list of foods and beverages to avoid. Contact your dietitian for more information.  WHERE CAN I FIND MORE INFORMATION?  National [employer], [employer], [employer]: www.nhlbi.nih.gov/health/health-topics/topics/dash/     This information is not intended to replace advice given to you by your health care provider. Make sure you discuss any questions you have with your health care provider.     Document Released: 07/14/2011 Document Revised: 08/15/2014 Document Reviewed: 05/29/2013  Elsevier Interactive Patient Education ©2016 [employer]

## 2016-01-13 ENCOUNTER — Encounter: Payer: Self-pay | Admitting: Family Medicine

## 2016-01-13 ENCOUNTER — Ambulatory Visit (INDEPENDENT_AMBULATORY_CARE_PROVIDER_SITE_OTHER): Payer: BC Managed Care – PPO | Admitting: Family Medicine

## 2016-01-13 VITALS — BP 118/82 | HR 90 | Temp 98.6°F | Ht 66.3 in | Wt 217.0 lb

## 2016-01-13 DIAGNOSIS — I10 Essential (primary) hypertension: Secondary | ICD-10-CM

## 2016-01-13 DIAGNOSIS — E119 Type 2 diabetes mellitus without complications: Secondary | ICD-10-CM

## 2016-01-13 DIAGNOSIS — F32A Depression, unspecified: Secondary | ICD-10-CM

## 2016-01-13 DIAGNOSIS — F329 Major depressive disorder, single episode, unspecified: Secondary | ICD-10-CM | POA: Diagnosis not present

## 2016-01-13 LAB — BAYER DCA HB A1C WAIVED: HB A1C: 7.3 % — AB (ref <7.0)

## 2016-01-13 MED ORDER — DULOXETINE HCL 60 MG PO CPEP: 60.0000 mg | ORAL_CAPSULE | Freq: Every day | ORAL | Status: DC

## 2016-01-13 MED ORDER — EMPAGLIFLOZIN 25 MG PO TABS: 25.0000 mg | ORAL_TABLET | Freq: Every day | ORAL | Status: DC

## 2016-01-13 MED ORDER — LISINOPRIL 10 MG PO TABS: 10.0000 mg | ORAL_TABLET | Freq: Every day | ORAL | Status: DC

## 2016-01-13 MED ORDER — DULOXETINE HCL 30 MG PO CPEP: 30.0000 mg | ORAL_CAPSULE | Freq: Every day | ORAL | Status: DC

## 2016-01-13 NOTE — Assessment & Plan Note (Signed)
Poor control will begin Jardiance patient education given

## 2016-01-13 NOTE — Assessment & Plan Note (Signed)
Depression recurrence with a lot of stress will start Cymbalta 30 mg for one week then 60 mg. Hoping this will help both her arthritis aches and her depression. We'll need to recheck 1 month for depression.

## 2016-01-13 NOTE — Progress Notes (Signed)
BP 118/82 mmHg  Pulse 90  Temp(Src) 98.6 F (37 C)  Ht 5' 6.3" (1.684 m)  Wt 217 lb (98.431 kg)  BMI 34.71 kg/m2  SpO2 95%  LMP 12/24/2015 (Approximate)   Subjective:    Patient ID: Holly Morales Height, female    DOB: [birth year], 45 y.o.   MRN: [id number]  HPI: Holly Morales is a 45 y.o. female  Chief Complaint  Patient presents with  . Diabetes  . Hypertension  Patient under a great deal of stress with husband and jobs. This is been ongoing for months. Due to the stress patient is lost weight. Her arthritis in both knees hips back is gotten much worse with just generalized aches and pains. She is also very depressed with poor sleep and poor energy loss of interest in usual things. No suicidal ideation.  Blood pressures doing very well with lisinopril in no side effects  Diabetes in spite of weight loss blood sugars been elevated taking metformin thousand twice a day without problems or side effects.  Relevant past medical, surgical, family and social history reviewed and updated as indicated. Interim medical history since our last visit reviewed. Allergies and medications reviewed and updated.  Review of Systems  Constitutional: Negative.   Respiratory: Negative.   Cardiovascular: Negative.     Per HPI unless specifically indicated above     Objective:    BP 118/82 mmHg  Pulse 90  Temp(Src) 98.6 F (37 C)  Ht 5' 6.3" (1.684 m)  Wt 217 lb (98.431 kg)  BMI 34.71 kg/m2  SpO2 95%  LMP 12/24/2015 (Approximate)  Wt Readings from Last 3 Encounters:  01/13/16 217 lb (98.431 kg)  11/27/15 227 lb (102.967 kg)  10/01/15 227 lb (102.967 kg)    Physical Exam  Constitutional: She is oriented to person, place, and time. She appears well-developed and well-nourished. No distress.  HENT:  Head: Normocephalic and atraumatic.  Right Ear: Hearing normal.  Left Ear: Hearing normal.  Nose: Nose normal.  Eyes: Conjunctivae and lids are normal. Right eye exhibits no  discharge. Left eye exhibits no discharge. No scleral icterus.  Cardiovascular: Normal rate, regular rhythm and normal heart sounds.   Pulmonary/Chest: Effort normal and breath sounds normal. No respiratory distress.  Musculoskeletal: Normal range of motion.  Neurological: She is alert and oriented to person, place, and time.  Skin: Skin is intact. No rash noted.  Psychiatric: She has a normal mood and affect. Her speech is normal and behavior is normal. Judgment and thought content normal. Cognition and memory are normal.    Results for orders placed or performed in visit on 10/01/15  Microscopic Examination  Result Value Ref Range   WBC, UA None seen 0 -  5 /hpf   RBC, UA 0-2 0 -  2 /hpf   Epithelial Cells (non renal) 0-10 0 - 10 /hpf   Bacteria, UA Few None seen/Few  Urinalysis, Routine w reflex microscopic (not at [facility])  Result Value Ref Range   Specific Gravity, UA <1.005 (L) 1.005 - 1.030   pH, UA 6.0 5.0 - 7.5   Color, UA Yellow Yellow   Appearance Ur Clear Clear   Leukocytes, UA Negative Negative   Protein, UA Negative Negative/Trace   Glucose, UA Negative Negative   Ketones, UA Negative Negative   RBC, UA Trace (A) Negative   Bilirubin, UA Negative Negative   Urobilinogen, Ur 0.2 0.2 - 1.0 mg/dL   Nitrite, UA Negative Negative   Microscopic Examination See  below:   Bayer DCA Hb A1c Waived  Result Value Ref Range   Bayer DCA Hb A1c Waived 7.7 (H) <7.0 %  CBC with Differential/Platelet  Result Value Ref Range   WBC 8.9 3.4 - 10.8 x10E3/uL   RBC 4.36 3.77 - 5.28 x10E6/uL   Hemoglobin 13.6 11.1 - 15.9 g/dL   Hematocrit 11.9 14.7 - 46.6 %   MCV 96 79 - 97 fL   MCH 31.2 26.6 - 33.0 pg   MCHC 32.6 31.5 - 35.7 g/dL   RDW 82.9 56.2 - 13.0 %   Platelets 269 150 - 379 x10E3/uL   Neutrophils 63 %   Lymphs 26 %   Monocytes 8 %   Eos 2 %   Basos 0 %   Neutrophils Absolute 5.7 1.4 - 7.0 x10E3/uL   Lymphocytes Absolute 2.3 0.7 - 3.1 x10E3/uL   Monocytes Absolute 0.7 0.1 -  0.9 x10E3/uL   EOS (ABSOLUTE) 0.2 0.0 - 0.4 x10E3/uL   Basophils Absolute 0.0 0.0 - 0.2 x10E3/uL   Immature Granulocytes 1 %   Immature Grans (Abs) 0.0 0.0 - 0.1 x10E3/uL  Comprehensive metabolic panel  Result Value Ref Range   Glucose 155 (H) 65 - 99 mg/dL   BUN 10 6 - 24 mg/dL   Creatinine, Ser 8.65 0.57 - 1.00 mg/dL   GFR calc non Af Amer 108 >59 mL/min/1.73   GFR calc Af Amer 124 >59 mL/min/1.73   BUN/Creatinine Ratio 16 9 - 23   Sodium 138 134 - 144 mmol/L   Potassium 4.7 3.5 - 5.2 mmol/L   Chloride 101 96 - 106 mmol/L   CO2 23 18 - 29 mmol/L   Calcium 9.5 8.7 - 10.2 mg/dL   Total Protein 7.1 6.0 - 8.5 g/dL   Albumin 4.3 3.5 - 5.5 g/dL   Globulin, Total 2.8 1.5 - 4.5 g/dL   Albumin/Globulin Ratio 1.5 1.1 - 2.5   Bilirubin Total <0.2 0.0 - 1.2 mg/dL   Alkaline Phosphatase 82 39 - 117 IU/L   AST 17 0 - 40 IU/L   ALT 20 0 - 32 IU/L  Lipid Panel w/o Chol/HDL Ratio  Result Value Ref Range   Cholesterol, Total 175 100 - 199 mg/dL   Triglycerides 784 0 - 149 mg/dL   HDL 36 (L) >69 mg/dL   VLDL Cholesterol Cal 25 5 - 40 mg/dL   LDL Calculated 629 (H) 0 - 99 mg/dL  TSH  Result Value Ref Range   TSH 3.840 0.450 - 4.500 uIU/mL      Assessment & Plan:   Problem List Items Addressed This Visit      Cardiovascular and Mediastinum   Hypertension    The current medical regimen is effective;  continue present plan and medications. Will check BMP to assess stability on new medication      Relevant Medications   lisinopril (PRINIVIL,ZESTRIL) 10 MG tablet     Endocrine   Diabetes mellitus without complication (HCC) - Primary    Poor control will begin Jardiance patient education given      Relevant Medications   lisinopril (PRINIVIL,ZESTRIL) 10 MG tablet   empagliflozin (JARDIANCE) 25 MG TABS tablet   Other Relevant Orders   Bayer DCA Hb A1c Waived   Basic metabolic panel     Other   Depression    Depression recurrence with a lot of stress will start Cymbalta 30 mg  for one week then 60 mg. Hoping this will help both her arthritis aches and  her depression. We'll need to recheck 1 month for depression.      Relevant Medications   DULoxetine (CYMBALTA) 30 MG capsule   DULoxetine (CYMBALTA) 60 MG capsule       Follow up plan: Return in about 4 weeks (around 02/10/2016) for med check on cymbalta and 3 mo A1C ,bmp, lipids, alt, ast.

## 2016-01-13 NOTE — Assessment & Plan Note (Signed)
The current medical regimen is effective;  continue present plan and medications. Will check BMP to assess stability on new medication

## 2016-01-14 LAB — BASIC METABOLIC PANEL
BUN/Creatinine Ratio: 17 (ref 9–23)
BUN: 10 mg/dL (ref 6–24)
CALCIUM: 9.5 mg/dL (ref 8.7–10.2)
CO2: 21 mmol/L (ref 18–29)
Chloride: 96 mmol/L (ref 96–106)
Creatinine, Ser: 0.58 mg/dL (ref 0.57–1.00)
GFR, EST AFRICAN AMERICAN: 128 mL/min/{1.73_m2} (ref >59)
GFR, EST NON AFRICAN AMERICAN: 111 mL/min/{1.73_m2} (ref >59)
Glucose: 121 mg/dL — ABNORMAL HIGH (ref 65–99)
POTASSIUM: 4.4 mmol/L (ref 3.5–5.2)
Sodium: 136 mmol/L (ref 134–144)

## 2016-01-21 ENCOUNTER — Other Ambulatory Visit: Payer: Self-pay | Admitting: Unknown Physician Specialty

## 2016-02-29 ENCOUNTER — Encounter: Payer: Self-pay | Admitting: Unknown Physician Specialty

## 2016-02-29 ENCOUNTER — Ambulatory Visit (INDEPENDENT_AMBULATORY_CARE_PROVIDER_SITE_OTHER): Payer: BC Managed Care – PPO | Admitting: Unknown Physician Specialty

## 2016-02-29 VITALS — BP 139/83 | HR 83 | Temp 98.7°F | Ht 67.1 in | Wt 213.8 lb

## 2016-02-29 DIAGNOSIS — B3731 Acute candidiasis of vulva and vagina: Secondary | ICD-10-CM | POA: Insufficient documentation

## 2016-02-29 DIAGNOSIS — F329 Major depressive disorder, single episode, unspecified: Secondary | ICD-10-CM | POA: Diagnosis not present

## 2016-02-29 DIAGNOSIS — B373 Candidiasis of vulva and vagina: Secondary | ICD-10-CM | POA: Diagnosis not present

## 2016-02-29 DIAGNOSIS — F32A Depression, unspecified: Secondary | ICD-10-CM

## 2016-02-29 MED ORDER — LISINOPRIL 10 MG PO TABS: 10.0000 mg | ORAL_TABLET | Freq: Every day | ORAL | 1 refills | Status: DC

## 2016-02-29 MED ORDER — DULOXETINE HCL 60 MG PO CPEP: 60.0000 mg | ORAL_CAPSULE | Freq: Every day | ORAL | 3 refills | Status: DC

## 2016-02-29 MED ORDER — FLUCONAZOLE 150 MG PO TABS: 150.0000 mg | ORAL_TABLET | ORAL | 0 refills | Status: DC

## 2016-02-29 NOTE — Assessment & Plan Note (Signed)
Due to Jardiance.  Rx Diflucan monthly

## 2016-02-29 NOTE — Assessment & Plan Note (Signed)
Good results with Cymbalta.  Continue present treatment

## 2016-02-29 NOTE — Progress Notes (Signed)
BP 139/83   Pulse 83   Temp 98.7 F (37.1 C)   Ht 5' 7.1" (1.704 m)   Wt 213 lb 12.8 oz (97 kg)   LMP 02/26/2016 (Exact Date)   SpO2 98%   BMI 33.39 kg/m    Subjective:    Patient ID: Holly Morales Height, female    DOB: 04-10-1971, 45 y.o.   MRN: [id number]  HPI: Holly Morales is a 45 y.o. female  Chief Complaint  Patient presents with  . Depression  . Vaginitis    external itching, states Dr. Dossie Arbour told her this may happen after starting the jardiance    Depression Last visit started Cymbalta for depression and aching.  She seems to be much better  Depression screen [facility] 2/9 02/29/2016 10/01/2015 01/02/2015  Decreased Interest 0 2 0  Down, Depressed, Hopeless 0 1 0  PHQ - 2 Score 0 3 0  Altered sleeping - 1 -  Tired, decreased energy - 1 -  Change in appetite - 1 -  Feeling bad or failure about yourself  - 0 -  Trouble concentrating - 0 -  Moving slowly or fidgety/restless - 0 -  Suicidal thoughts - 0 -  PHQ-9 Score - 6 -    Vaginal itching Pt started Jardiance.  She tried Monistat and an OTC pro-biotic.  They seemed to help.    Relevant past medical, surgical, family and social history reviewed and updated as indicated. Interim medical history since our last visit reviewed. Allergies and medications reviewed and updated.  Review of Systems  Per HPI unless specifically indicated above     Objective:    BP 139/83   Pulse 83   Temp 98.7 F (37.1 C)   Ht 5' 7.1" (1.704 m)   Wt 213 lb 12.8 oz (97 kg)   LMP 02/26/2016 (Exact Date)   SpO2 98%   BMI 33.39 kg/m   Wt Readings from Last 3 Encounters:  02/29/16 213 lb 12.8 oz (97 kg)  01/13/16 217 lb (98.4 kg)  11/27/15 227 lb (103 kg)    Physical Exam  Constitutional: She is oriented to person, place, and time. She appears well-developed and well-nourished. No distress.  HENT:  Head: Normocephalic and atraumatic.  Eyes: Conjunctivae and lids are normal. Right eye exhibits no discharge. Left eye exhibits  no discharge. No scleral icterus.  Cardiovascular: Normal rate.   Pulmonary/Chest: Effort normal.  Abdominal: Normal appearance. There is no splenomegaly or hepatomegaly.  Musculoskeletal: Normal range of motion.  Neurological: She is alert and oriented to person, place, and time.  Skin: Skin is intact. No rash noted. No pallor.  Psychiatric: She has a normal mood and affect. Her behavior is normal. Judgment and thought content normal.    Results for orders placed or performed in visit on 01/13/16  Bayer DCA Hb A1c Waived  Result Value Ref Range   Bayer DCA Hb A1c Waived 7.3 (H) <7.0 %  Basic metabolic panel  Result Value Ref Range   Glucose 121 (H) 65 - 99 mg/dL   BUN 10 6 - 24 mg/dL   Creatinine, Ser 6.96 0.57 - 1.00 mg/dL   GFR calc non Af Amer 111 >59 mL/min/1.73   GFR calc Af Amer 128 >59 mL/min/1.73   BUN/Creatinine Ratio 17 9 - 23   Sodium 136 134 - 144 mmol/L   Potassium 4.4 3.5 - 5.2 mmol/L   Chloride 96 96 - 106 mmol/L   CO2 21 18 - 29 mmol/L  Calcium 9.5 8.7 - 10.2 mg/dL      Assessment & Plan:   Problem List Items Addressed This Visit      Unprioritized   Depression    Good results with Cymbalta.  Continue present treatment      Relevant Medications   DULoxetine (CYMBALTA) 60 MG capsule   Yeast vaginitis - Primary    Due to Jardiance.  Rx Diflucan monthly      Relevant Medications   fluconazole (DIFLUCAN) 150 MG tablet    Other Visit Diagnoses   None.      Follow up plan: Return for with Dr. Dossie Arbour for regular visit.

## 2016-03-13 ENCOUNTER — Other Ambulatory Visit: Payer: Self-pay | Admitting: Family Medicine

## 2016-04-25 ENCOUNTER — Ambulatory Visit (INDEPENDENT_AMBULATORY_CARE_PROVIDER_SITE_OTHER): Payer: BC Managed Care – PPO | Admitting: Family Medicine

## 2016-04-25 ENCOUNTER — Encounter: Payer: Self-pay | Admitting: Family Medicine

## 2016-04-25 VITALS — BP 121/80 | HR 89 | Temp 98.2°F | Ht 67.3 in | Wt 209.0 lb

## 2016-04-25 DIAGNOSIS — E119 Type 2 diabetes mellitus without complications: Secondary | ICD-10-CM | POA: Diagnosis not present

## 2016-04-25 DIAGNOSIS — E7800 Pure hypercholesterolemia, unspecified: Secondary | ICD-10-CM | POA: Diagnosis not present

## 2016-04-25 DIAGNOSIS — I10 Essential (primary) hypertension: Secondary | ICD-10-CM | POA: Diagnosis not present

## 2016-04-25 DIAGNOSIS — E1169 Type 2 diabetes mellitus with other specified complication: Secondary | ICD-10-CM | POA: Insufficient documentation

## 2016-04-25 LAB — LP+ALT+AST PICCOLO, WAIVED
ALT (SGPT) PICCOLO, WAIVED: 40 U/L (ref 10–47)
AST (SGOT) PICCOLO, WAIVED: 34 U/L (ref 11–38)
CHOL/HDL RATIO PICCOLO,WAIVE: 3.8 mg/dL
CHOLESTEROL PICCOLO, WAIVED: 208 mg/dL — AB (ref <200)
HDL CHOL PICCOLO, WAIVED: 55 mg/dL — AB (ref >59)
LDL Chol Calc Piccolo Waived: 134 mg/dL — ABNORMAL HIGH (ref <100)
TRIGLYCERIDES PICCOLO,WAIVED: 99 mg/dL (ref <150)
VLDL Chol Calc Piccolo,Waive: 20 mg/dL (ref <30)

## 2016-04-25 LAB — BAYER DCA HB A1C WAIVED: HB A1C (BAYER DCA - WAIVED): 6.6 % (ref <7.0)

## 2016-04-25 MED ORDER — LISINOPRIL 10 MG PO TABS: 10.0000 mg | ORAL_TABLET | Freq: Every day | ORAL | 6 refills | Status: DC

## 2016-04-25 MED ORDER — ROSUVASTATIN CALCIUM 10 MG PO TABS: 10.0000 mg | ORAL_TABLET | Freq: Every day | ORAL | 6 refills | Status: DC

## 2016-04-25 MED ORDER — FLUCONAZOLE 150 MG PO TABS: 150.0000 mg | ORAL_TABLET | ORAL | 6 refills | Status: DC

## 2016-04-25 NOTE — Assessment & Plan Note (Signed)
The current medical regimen is effective;  continue present plan and medications.  

## 2016-04-25 NOTE — Assessment & Plan Note (Signed)
Discuss good wt loss but no chol improvement Will start crestor

## 2016-04-25 NOTE — Progress Notes (Signed)
BP 121/80 (BP Location: Left Arm, Patient Position: Sitting, Cuff Size: Normal)   Pulse 89   Temp 98.2 F (36.8 C)   Ht 5' 7.3" (1.709 m)   Wt 209 lb (94.8 kg)   LMP 03/31/2016 (Approximate)   SpO2 98%   BMI 32.44 kg/m    Subjective:    Patient ID: Holly Morales, female    DOB: October 09, 1970, 45 y.o.   MRN: [id number]  HPI: Holly Morales is a 45 y.o. female  Chief Complaint  Patient presents with  . Diabetes  . Hypertension  patient doing well has lost nice weight feeling much better Is bothered by bilateral calf cramps especially at night also bothered by bilateral hip pain does lot of lifting  Relevant past medical, surgical, family and social history reviewed and updated as indicated. Interim medical history since our last visit reviewed. Allergies and medications reviewed and updated.  Review of Systems  Constitutional: Negative.   Respiratory: Negative.   Cardiovascular: Negative.     Per HPI unless specifically indicated above     Objective:    BP 121/80 (BP Location: Left Arm, Patient Position: Sitting, Cuff Size: Normal)   Pulse 89   Temp 98.2 F (36.8 C)   Ht 5' 7.3" (1.709 m)   Wt 209 lb (94.8 kg)   LMP 03/31/2016 (Approximate)   SpO2 98%   BMI 32.44 kg/m   Wt Readings from Last 3 Encounters:  04/25/16 209 lb (94.8 kg)  02/29/16 213 lb 12.8 oz (97 kg)  01/13/16 217 lb (98.4 kg)    Physical Exam  Constitutional: She is oriented to person, place, and time. She appears well-developed and well-nourished. No distress.  HENT:  Head: Normocephalic and atraumatic.  Right Ear: Hearing normal.  Left Ear: Hearing normal.  Nose: Nose normal.  Eyes: Conjunctivae and lids are normal. Right eye exhibits no discharge. Left eye exhibits no discharge. No scleral icterus.  Cardiovascular: Normal rate, regular rhythm and normal heart sounds.   Pulmonary/Chest: Effort normal and breath sounds normal. No respiratory distress.  Abdominal: She exhibits no  distension and no mass. There is no tenderness. There is no rebound.  Musculoskeletal: Normal range of motion.  Knee and hip exam normal musculoskeletal lower legs normal  Neurological: She is alert and oriented to person, place, and time.  Skin: Skin is intact. No rash noted.  Psychiatric: She has a normal mood and affect. Her speech is normal and behavior is normal. Judgment and thought content normal. Cognition and memory are normal.    Results for orders placed or performed in visit on 01/13/16  Bayer DCA Hb A1c Waived  Result Value Ref Range   Bayer DCA Hb A1c Waived 7.3 (H) <7.0 %  Basic metabolic panel  Result Value Ref Range   Glucose 121 (H) 65 - 99 mg/dL   BUN 10 6 - 24 mg/dL   Creatinine, Ser 4.09 0.57 - 1.00 mg/dL   GFR calc non Af Amer 111 >59 mL/min/1.73   GFR calc Af Amer 128 >59 mL/min/1.73   BUN/Creatinine Ratio 17 9 - 23   Sodium 136 134 - 144 mmol/L   Potassium 4.4 3.5 - 5.2 mmol/L   Chloride 96 96 - 106 mmol/L   CO2 21 18 - 29 mmol/L   Calcium 9.5 8.7 - 10.2 mg/dL      Assessment & Plan:   Problem List Items Addressed This Visit      Cardiovascular and Mediastinum   Hypertension  The current medical regimen is effective;  continue present plan and medications.       Relevant Medications   rosuvastatin (CRESTOR) 10 MG tablet   lisinopril (PRINIVIL,ZESTRIL) 10 MG tablet   Other Relevant Orders   LP+ALT+AST Piccolo, Waived   Bayer DCA Hb A1c Waived   Basic metabolic panel     Endocrine   Diabetes mellitus without complication (HCC) - Primary    The current medical regimen is effective;  continue present plan and medications.       Relevant Medications   rosuvastatin (CRESTOR) 10 MG tablet   lisinopril (PRINIVIL,ZESTRIL) 10 MG tablet   Other Relevant Orders   LP+ALT+AST Piccolo, Waived   Bayer DCA Hb A1c Waived   Basic metabolic panel     Other   Hypercholesteremia    Discuss good wt loss but no chol improvement Will start crestor        Relevant Medications   rosuvastatin (CRESTOR) 10 MG tablet   lisinopril (PRINIVIL,ZESTRIL) 10 MG tablet    Other Visit Diagnoses   None.     Discuss leg cramps stretching and massage Discuss hip pain care and treatment use of medications if worsening referred to orthopedics  Follow up plan: Return in about 3 months (around 07/25/2016) for Hemoglobin A1c,   Lipids, ALT, AST.

## 2016-04-26 ENCOUNTER — Encounter: Payer: Self-pay | Admitting: Family Medicine

## 2016-04-26 LAB — BASIC METABOLIC PANEL
BUN/Creatinine Ratio: 15 (ref 9–23)
BUN: 11 mg/dL (ref 6–24)
CO2: 20 mmol/L (ref 18–29)
Calcium: 9.6 mg/dL (ref 8.7–10.2)
Chloride: 97 mmol/L (ref 96–106)
Creatinine, Ser: 0.71 mg/dL (ref 0.57–1.00)
GFR, EST AFRICAN AMERICAN: 118 mL/min/{1.73_m2} (ref >59)
GFR, EST NON AFRICAN AMERICAN: 102 mL/min/{1.73_m2} (ref >59)
Glucose: 127 mg/dL — ABNORMAL HIGH (ref 65–99)
POTASSIUM: 4.7 mmol/L (ref 3.5–5.2)
SODIUM: 137 mmol/L (ref 134–144)

## 2016-05-14 ENCOUNTER — Other Ambulatory Visit: Payer: Self-pay | Admitting: Family Medicine

## 2016-05-16 ENCOUNTER — Other Ambulatory Visit: Payer: Self-pay | Admitting: Unknown Physician Specialty

## 2016-05-27 NOTE — Telephone Encounter (Signed)
Your patient 

## 2016-06-20 ENCOUNTER — Telehealth: Payer: Self-pay

## 2016-06-20 NOTE — Telephone Encounter (Signed)
Call pt 

## 2016-06-20 NOTE — Telephone Encounter (Signed)
Patient called in and stated that she has been having a yeast infection or UTI since last Wednesday. States she takes jardiance and has a rx for diflucan because a yeast infection is a side effect of jardiance. Patient states she has tried taking the diflucan and it is not helping and cream is not helping either. States she has extreme burning but has not seen any blood in urine. [facility] [place]

## 2016-06-21 MED ORDER — SITAGLIPTIN PHOSPHATE 100 MG PO TABS: 100.0000 mg | ORAL_TABLET | Freq: Every day | ORAL | 3 refills | Status: DC

## 2016-06-21 NOTE — Telephone Encounter (Signed)
Phone call

## 2016-06-21 NOTE — Telephone Encounter (Signed)
Phone call Discussed with patient having ongoing yeast infections in spite of good hygiene and good medications. We will stop Jardiance start Januvia

## 2016-06-21 NOTE — Telephone Encounter (Signed)
Left message to call.

## 2016-07-25 ENCOUNTER — Ambulatory Visit: Payer: BC Managed Care – PPO | Admitting: Family Medicine

## 2016-07-27 ENCOUNTER — Ambulatory Visit (INDEPENDENT_AMBULATORY_CARE_PROVIDER_SITE_OTHER): Payer: BC Managed Care – PPO | Admitting: Family Medicine

## 2016-07-27 ENCOUNTER — Encounter: Payer: Self-pay | Admitting: Family Medicine

## 2016-07-27 VITALS — BP 118/79 | HR 75 | Temp 98.6°F | Ht 67.0 in | Wt 202.4 lb

## 2016-07-27 DIAGNOSIS — M25559 Pain in unspecified hip: Secondary | ICD-10-CM | POA: Diagnosis not present

## 2016-07-27 DIAGNOSIS — E7800 Pure hypercholesterolemia, unspecified: Secondary | ICD-10-CM

## 2016-07-27 DIAGNOSIS — I10 Essential (primary) hypertension: Secondary | ICD-10-CM

## 2016-07-27 DIAGNOSIS — E119 Type 2 diabetes mellitus without complications: Secondary | ICD-10-CM | POA: Diagnosis not present

## 2016-07-27 DIAGNOSIS — G8929 Other chronic pain: Secondary | ICD-10-CM | POA: Diagnosis not present

## 2016-07-27 LAB — LP+ALT+AST PICCOLO, WAIVED
ALT (SGPT) PICCOLO, WAIVED: 18 U/L (ref 10–47)
AST (SGOT) Piccolo, Waived: 30 U/L (ref 11–38)
CHOLESTEROL PICCOLO, WAIVED: 122 mg/dL (ref <200)
Chol/HDL Ratio Piccolo,Waive: 2.2 mg/dL
HDL CHOL PICCOLO, WAIVED: 56 mg/dL — AB (ref >59)
LDL Chol Calc Piccolo Waived: 49 mg/dL (ref <100)
Triglycerides Piccolo,Waived: 83 mg/dL (ref <150)
VLDL CHOL CALC PICCOLO,WAIVE: 17 mg/dL (ref <30)

## 2016-07-27 LAB — BAYER DCA HB A1C WAIVED: HB A1C: 6.6 % (ref <7.0)

## 2016-07-27 MED ORDER — SITAGLIPTIN PHOSPHATE 100 MG PO TABS: 100.0000 mg | ORAL_TABLET | Freq: Every day | ORAL | 4 refills | Status: DC

## 2016-07-27 NOTE — Assessment & Plan Note (Signed)
The current medical regimen is effective;  continue present plan and medications.  

## 2016-07-27 NOTE — Progress Notes (Signed)
BP 118/79 (BP Location: Left Arm, Patient Position: Sitting, Cuff Size: Normal)   Pulse 75   Temp 98.6 F (37 C) (Oral)   Ht 5\' 7"  (1.702 m)   Wt 202 lb 6.4 oz (91.8 kg)   SpO2 97%   BMI 31.70 kg/m    Subjective:    Patient ID: Holly Morales, female    DOB: Oct 01, 1970, 45 y.o.   MRN: [id number]  HPI: Holly Morales is a 45 y.o. female  Chief Complaint  Patient presents with  . Diabetes  . Hypertension  . Hyperlipidemia  . Follow-up   Patient with primary concerns and bilateral hip pain though right worse than left worse with walking sitting bothersome at night it's more height lateral over the more hip joint not in the bursa area not in the back area. Patient's tried anti-inflammatories and Tylenol with only modest relief. Is ready to go to orthopedics to have further evaluation and diagnosis made. Follow-up diabetes doing well yeast infections when away with stopping Hardy events. And doing well. Blood sugars doing well patient's especially continued to lose weight. This is contributed by loss of stress that was a big burden. Blood pressure doing well no complaints. Cholesterol taking Crestor without problems and with weight loss seen great results. Relevant past medical, surgical, family and social history reviewed and updated as indicated. Interim medical history since our last visit reviewed. Allergies and medications reviewed and updated.  Review of Systems  Constitutional: Negative.   HENT: Negative.   Eyes: Negative.   Respiratory: Negative.   Cardiovascular: Negative.   Gastrointestinal: Negative.   Endocrine: Negative.   Genitourinary: Negative.   Musculoskeletal: Negative.   Skin: Negative.   Allergic/Immunologic: Negative.   Neurological: Negative.   Hematological: Negative.   Psychiatric/Behavioral: Negative.     Per HPI unless specifically indicated above     Objective:    BP 118/79 (BP Location: Left Arm, Patient Position: Sitting, Cuff Size:  Normal)   Pulse 75   Temp 98.6 F (37 C) (Oral)   Ht 5\' 7"  (1.702 m)   Wt 202 lb 6.4 oz (91.8 kg)   SpO2 97%   BMI 31.70 kg/m   Wt Readings from Last 3 Encounters:  07/27/16 202 lb 6.4 oz (91.8 kg)  04/25/16 209 lb (94.8 kg)  02/29/16 213 lb 12.8 oz (97 kg)    Physical Exam  Constitutional: She is oriented to person, place, and time. She appears well-developed and well-nourished. No distress.  HENT:  Head: Normocephalic and atraumatic.  Right Ear: Hearing normal.  Left Ear: Hearing normal.  Nose: Nose normal.  Eyes: Conjunctivae and lids are normal. Right eye exhibits no discharge. Left eye exhibits no discharge. No scleral icterus.  Cardiovascular: Normal rate, regular rhythm and normal heart sounds.   Pulmonary/Chest: Effort normal and breath sounds normal. No respiratory distress.  Musculoskeletal: Normal range of motion.  Neurological: She is alert and oriented to person, place, and time.  Skin: Skin is intact. No rash noted.  Psychiatric: She has a normal mood and affect. Her speech is normal and behavior is normal. Judgment and thought content normal. Cognition and memory are normal.    Results for orders placed or performed in visit on 04/25/16  LP+ALT+AST Piccolo, [employer]Waived  Result Value Ref Range   ALT (SGPT) Piccolo, Waived 40 10 - 47 U/L   AST (SGOT) Piccolo, Waived 34 11 - 38 U/L   Cholesterol Piccolo, Waived 208 (H) <200 mg/dL   HDL Chol  Piccolo, Waived 55 (L) >59 mg/dL   Triglycerides Piccolo,Waived 99 <150 mg/dL   Chol/HDL Ratio Piccolo,Waive 3.8 mg/dL   LDL Chol Calc Piccolo Waived 134 (H) <100 mg/dL   VLDL Chol Calc Piccolo,Waive 20 <30 mg/dL  Bayer DCA Hb W0JA1c Waived  Result Value Ref Range   Bayer DCA Hb A1c Waived 6.6 <7.0 %  Basic metabolic panel  Result Value Ref Range   Glucose 127 (H) 65 - 99 mg/dL   BUN 11 6 - 24 mg/dL   Creatinine, Ser 8.110.71 0.57 - 1.00 mg/dL   GFR calc non Af Amer 102 >59 mL/min/1.73   GFR calc Af Amer 118 >59 mL/min/1.73    BUN/Creatinine Ratio 15 9 - 23   Sodium 137 134 - 144 mmol/L   Potassium 4.7 3.5 - 5.2 mmol/L   Chloride 97 96 - 106 mmol/L   CO2 20 18 - 29 mmol/L   Calcium 9.6 8.7 - 10.2 mg/dL      Assessment & Plan:   Problem List Items Addressed This Visit      Cardiovascular and Mediastinum   Hypertension    The current medical regimen is effective;  continue present plan and medications.       Relevant Orders   LP+ALT+AST Piccolo, [place]     Endocrine   Diabetes mellitus without complication (HCC) - Primary    The current medical regimen is effective;  continue present plan and medications.       Relevant Medications   sitaGLIPtin (JANUVIA) 100 MG tablet   Other Relevant Orders   Bayer DCA Hb A1c Waived     Other   Hypercholesteremia   Relevant Orders   LP+ALT+AST Piccolo, Waived   Hip pain, chronic    Patient with ongoing bilateral hip pain ready to see orthopedics to have something done as it's becoming life limiting.      Relevant Orders   Ambulatory referral to Orthopedic Surgery       Follow up plan: Return in about 3 months (around 10/25/2016) for Physical Exam, Hemoglobin A1c.

## 2016-07-27 NOTE — Assessment & Plan Note (Signed)
Patient with ongoing bilateral hip pain ready to see orthopedics to have something done as it's becoming life limiting.

## 2016-07-28 ENCOUNTER — Telehealth: Payer: Self-pay

## 2016-07-28 MED ORDER — MELOXICAM 15 MG PO TABS: 15.0000 mg | ORAL_TABLET | Freq: Every day | ORAL | 3 refills | Status: DC

## 2016-07-28 NOTE — Telephone Encounter (Signed)
meloxicam sent.

## 2016-07-28 NOTE — Telephone Encounter (Signed)
Called patient to let her know of her ortho appointment.  Patient said you guys discussed yesterday you prescribing something to help with her pain. She stated she's been taking ibuprofen 800mg  and tylenol. She stated you explained that it was a something she could take once a day in place of all of that.   I asked the patient did meloxicam sound familiar and patient stated that was it.

## 2016-08-23 ENCOUNTER — Other Ambulatory Visit: Payer: Self-pay | Admitting: Family Medicine

## 2016-08-23 DIAGNOSIS — Z1231 Encounter for screening mammogram for malignant neoplasm of breast: Secondary | ICD-10-CM

## 2016-09-02 LAB — HM DIABETES EYE EXAM

## 2016-09-28 ENCOUNTER — Other Ambulatory Visit: Payer: Self-pay | Admitting: Family Medicine

## 2016-09-28 NOTE — Telephone Encounter (Signed)
Last OV: 07/27/16 Next OV: 326/18/18  Per pharmacy  Refills have expired.   No results found for: HGBA1C

## 2016-10-03 ENCOUNTER — Ambulatory Visit
Admission: RE | Admit: 2016-10-03 | Discharge: 2016-10-03 | Disposition: A | Discharge status: Home / Self Care | Payer: BC Managed Care – PPO | Source: Ambulatory Visit | Attending: Family Medicine | Admitting: Family Medicine

## 2016-10-03 ENCOUNTER — Other Ambulatory Visit: Payer: Self-pay | Admitting: Family Medicine

## 2016-10-03 DIAGNOSIS — Z1231 Encounter for screening mammogram for malignant neoplasm of breast: Secondary | ICD-10-CM | POA: Diagnosis present

## 2016-10-12 ENCOUNTER — Other Ambulatory Visit: Payer: Self-pay | Admitting: Family Medicine

## 2016-10-29 ENCOUNTER — Other Ambulatory Visit: Payer: Self-pay | Admitting: Family Medicine

## 2016-10-31 ENCOUNTER — Ambulatory Visit (INDEPENDENT_AMBULATORY_CARE_PROVIDER_SITE_OTHER): Payer: BC Managed Care – PPO | Admitting: Family Medicine

## 2016-10-31 ENCOUNTER — Encounter: Payer: Self-pay | Admitting: Family Medicine

## 2016-10-31 VITALS — BP 116/81 | HR 81 | Ht 67.32 in | Wt 186.1 lb

## 2016-10-31 DIAGNOSIS — I10 Essential (primary) hypertension: Secondary | ICD-10-CM | POA: Diagnosis not present

## 2016-10-31 DIAGNOSIS — E119 Type 2 diabetes mellitus without complications: Secondary | ICD-10-CM | POA: Diagnosis not present

## 2016-10-31 DIAGNOSIS — E7800 Pure hypercholesterolemia, unspecified: Secondary | ICD-10-CM | POA: Diagnosis not present

## 2016-10-31 DIAGNOSIS — Z1329 Encounter for screening for other suspected endocrine disorder: Secondary | ICD-10-CM

## 2016-10-31 DIAGNOSIS — Z0000 Encounter for general adult medical examination without abnormal findings: Secondary | ICD-10-CM | POA: Diagnosis not present

## 2016-10-31 LAB — HEMOGLOBIN A1C
Est. average glucose Bld gHb Est-mCnc: 120 mg/dL
Hgb A1c MFr Bld: 5.8 % — ABNORMAL HIGH (ref 4.8–5.6)

## 2016-10-31 LAB — URINALYSIS, ROUTINE W REFLEX MICROSCOPIC
BILIRUBIN UA: NEGATIVE
Glucose, UA: NEGATIVE
Ketones, UA: NEGATIVE
LEUKOCYTES UA: NEGATIVE
NITRITE UA: NEGATIVE
PH UA: 5.5 (ref 5.0–7.5)
Protein, UA: NEGATIVE
Urobilinogen, Ur: 0.2 mg/dL (ref 0.2–1.0)

## 2016-10-31 LAB — MICROSCOPIC EXAMINATION
BACTERIA UA: NONE SEEN
WBC UA: NONE SEEN /HPF (ref 0–?)

## 2016-10-31 MED ORDER — ROSUVASTATIN CALCIUM 10 MG PO TABS: 10.0000 mg | ORAL_TABLET | Freq: Every day | ORAL | 4 refills | Status: DC

## 2016-10-31 MED ORDER — METFORMIN HCL 500 MG PO TABS: 1000.0000 mg | ORAL_TABLET | Freq: Two times a day (BID) | ORAL | 4 refills | Status: DC

## 2016-10-31 MED ORDER — LISINOPRIL 10 MG PO TABS: 10.0000 mg | ORAL_TABLET | Freq: Every day | ORAL | 4 refills | Status: DC

## 2016-10-31 MED ORDER — SITAGLIPTIN PHOSPHATE 100 MG PO TABS: 100.0000 mg | ORAL_TABLET | Freq: Every day | ORAL | 4 refills | Status: DC

## 2016-10-31 MED ORDER — IBUPROFEN 800 MG PO TABS: 800.0000 mg | ORAL_TABLET | Freq: Three times a day (TID) | ORAL | 1 refills | Status: DC | PRN

## 2016-10-31 NOTE — Progress Notes (Signed)
BP 116/81   Pulse 81   Ht 5' 7.32" (1.71 m)   Wt 186 lb 1.6 oz (84.4 kg)   SpO2 95%   BMI 28.87 kg/m    Subjective:    Patient ID: Holly Morales, female    DOB: 25-Aug-1970, 46 y.o.   MRN: [id number]  HPI: Holly Morales is a 46 y.o. female  Chief Complaint  Patient presents with  . Annual Exam  . Hip Pain    Was d/x by Ortho with Bursitis. Has injection last 6 weeks.   Ongoing hip bursitis is diagnosed by orthopedics in [place]. Both hips with right being worse than left took meloxicam which didn't help got a shot from orthopedics which helps some. Reported x-ray is negative. Continues to be especially stiff in the morning when she gets up. No issues with diabetes noted low blood sugar spells no problems with medications takes faithfully. Cholesterol same thing doing well without problems. Blood pressure no issues takes lisinopril primarily for renal protection with mild blood pressure help.  Relevant past medical, surgical, family and social history reviewed and updated as indicated. Interim medical history since our last visit reviewed. Allergies and medications reviewed and updated.  Review of Systems  Constitutional: Negative.   HENT: Negative.   Eyes: Negative.   Respiratory: Negative.   Cardiovascular: Negative.   Gastrointestinal: Negative.   Endocrine: Negative.   Genitourinary: Negative.   Musculoskeletal: Negative.   Skin: Negative.   Allergic/Immunologic: Negative.   Neurological: Negative.   Hematological: Negative.   Psychiatric/Behavioral: Negative.     Per HPI unless specifically indicated above     Objective:    BP 116/81   Pulse 81   Ht 5' 7.32" (1.71 m)   Wt 186 lb 1.6 oz (84.4 kg)   SpO2 95%   BMI 28.87 kg/m   Wt Readings from Last 3 Encounters:  10/31/16 186 lb 1.6 oz (84.4 kg)  07/27/16 202 lb 6.4 oz (91.8 kg)  04/25/16 209 lb (94.8 kg)    Physical Exam  Constitutional: She is oriented to person, place, and time. She appears  well-developed and well-nourished.  HENT:  Head: Normocephalic and atraumatic.  Right Ear: External ear normal.  Left Ear: External ear normal.  Nose: Nose normal.  Mouth/Throat: Oropharynx is clear and moist.  Eyes: Conjunctivae and EOM are normal. Pupils are equal, round, and reactive to light.  Neck: Normal range of motion. Neck supple. Carotid bruit is not present.  Cardiovascular: Normal rate, regular rhythm and normal heart sounds.   No murmur heard. Pulmonary/Chest: Effort normal and breath sounds normal. She exhibits no mass. Right breast exhibits no mass, no skin change and no tenderness. Left breast exhibits no mass, no skin change and no tenderness. Breasts are symmetrical.  Abdominal: Soft. Bowel sounds are normal. There is no hepatosplenomegaly.  Musculoskeletal: Normal range of motion.  Neurological: She is alert and oriented to person, place, and time.  Skin: No rash noted.  Psychiatric: She has a normal mood and affect. Her behavior is normal. Judgment and thought content normal.    Results for orders placed or performed in visit on 09/06/16  HM DIABETES EYE EXAM  Result Value Ref Range   HM Diabetic Eye Exam No Retinopathy No Retinopathy      Assessment & Plan:   Problem List Items Addressed This Visit      Cardiovascular and Mediastinum   Hypertension    The current medical regimen is effective;  continue present  plan and medications.       Relevant Medications   lisinopril (PRINIVIL,ZESTRIL) 10 MG tablet   rosuvastatin (CRESTOR) 10 MG tablet   Other Relevant Orders   CBC with Differential/Platelet   Comprehensive metabolic panel   Lipid panel   Urinalysis, Routine w reflex microscopic   Hemoglobin A1c     Endocrine   Diabetes mellitus without complication (HCC)    The current medical regimen is effective;  continue present plan and medications.       Relevant Medications   lisinopril (PRINIVIL,ZESTRIL) 10 MG tablet   metFORMIN (GLUCOPHAGE) 500  MG tablet   rosuvastatin (CRESTOR) 10 MG tablet   sitaGLIPtin (JANUVIA) 100 MG tablet   Other Relevant Orders   CBC with Differential/Platelet   Comprehensive metabolic panel   Lipid panel   Urinalysis, Routine w reflex microscopic   Hemoglobin A1c     Other   Hypercholesteremia    The current medical regimen is effective;  continue present plan and medications.       Relevant Medications   lisinopril (PRINIVIL,ZESTRIL) 10 MG tablet   rosuvastatin (CRESTOR) 10 MG tablet   Other Relevant Orders   CBC with Differential/Platelet   Comprehensive metabolic panel   Lipid panel   Urinalysis, Routine w reflex microscopic   Hemoglobin A1c    Other Visit Diagnoses    Annual physical exam    -  Primary   Relevant Orders   CBC with Differential/Platelet   Comprehensive metabolic panel   Lipid panel   TSH   Urinalysis, Routine w reflex microscopic   Hemoglobin A1c   Thyroid disorder screen       Relevant Orders   TSH      With stretching able to identify. pisioform muscle is very sore discussed stretches and exercises. Follow up plan: Return in about 3 months (around 01/31/2017) for Hemoglobin A1c.

## 2016-10-31 NOTE — Telephone Encounter (Signed)
Last OV: 07/27/16 Next OV: 10/31/16  Lab Results  Component Value Date   CHOL 122 07/27/2016   HDL 36 (L) 10/01/2015   LDLCALC 114 (H) 10/01/2015   TRIG 83 07/27/2016   Lab Results  Component Value Date   CREATININE 0.71 04/25/2016   BUN 11 04/25/2016   NA 137 04/25/2016   K 4.7 04/25/2016   CL 97 04/25/2016   CO2 20 04/25/2016

## 2016-10-31 NOTE — Assessment & Plan Note (Signed)
The current medical regimen is effective;  continue present plan and medications.  

## 2016-11-01 ENCOUNTER — Encounter: Payer: Self-pay | Admitting: Family Medicine

## 2016-11-01 LAB — CBC WITH DIFFERENTIAL/PLATELET
Basophils Absolute: 0 10*3/uL (ref 0.0–0.2)
Basos: 0 %
EOS (ABSOLUTE): 0.1 10*3/uL (ref 0.0–0.4)
EOS: 2 %
HEMOGLOBIN: 14.2 g/dL (ref 11.1–15.9)
Hematocrit: 42.5 % (ref 34.0–46.6)
IMMATURE GRANS (ABS): 0 10*3/uL (ref 0.0–0.1)
Immature Granulocytes: 0 %
LYMPHS: 23 %
Lymphocytes Absolute: 1.9 10*3/uL (ref 0.7–3.1)
MCH: 32.6 pg (ref 26.6–33.0)
MCHC: 33.4 g/dL (ref 31.5–35.7)
MCV: 98 fL — ABNORMAL HIGH (ref 79–97)
MONOCYTES: 8 %
Monocytes Absolute: 0.6 10*3/uL (ref 0.1–0.9)
NEUTROS ABS: 5.4 10*3/uL (ref 1.4–7.0)
Neutrophils: 67 %
PLATELETS: 285 10*3/uL (ref 150–379)
RBC: 4.36 x10E6/uL (ref 3.77–5.28)
RDW: 13 % (ref 12.3–15.4)
WBC: 8.1 10*3/uL (ref 3.4–10.8)

## 2016-11-01 LAB — COMPREHENSIVE METABOLIC PANEL
ALK PHOS: 63 IU/L (ref 39–117)
ALT: 20 IU/L (ref 0–32)
AST: 20 IU/L (ref 0–40)
Albumin/Globulin Ratio: 1.9 (ref 1.2–2.2)
Albumin: 4.9 g/dL (ref 3.5–5.5)
BILIRUBIN TOTAL: 0.3 mg/dL (ref 0.0–1.2)
BUN/Creatinine Ratio: 11 (ref 9–23)
BUN: 6 mg/dL (ref 6–24)
CHLORIDE: 98 mmol/L (ref 96–106)
CO2: 25 mmol/L (ref 18–29)
CREATININE: 0.55 mg/dL — AB (ref 0.57–1.00)
Calcium: 9.9 mg/dL (ref 8.7–10.2)
GFR calc Af Amer: 129 mL/min/{1.73_m2} (ref >59)
GFR calc non Af Amer: 112 mL/min/{1.73_m2} (ref >59)
GLUCOSE: 114 mg/dL — AB (ref 65–99)
Globulin, Total: 2.6 g/dL (ref 1.5–4.5)
Potassium: 4.1 mmol/L (ref 3.5–5.2)
Sodium: 140 mmol/L (ref 134–144)
Total Protein: 7.5 g/dL (ref 6.0–8.5)

## 2016-11-01 LAB — LIPID PANEL
CHOLESTEROL TOTAL: 119 mg/dL (ref 100–199)
Chol/HDL Ratio: 2.2 ratio units (ref 0.0–4.4)
HDL: 54 mg/dL (ref >39)
LDL CALC: 46 mg/dL (ref 0–99)
Triglycerides: 95 mg/dL (ref 0–149)
VLDL CHOLESTEROL CAL: 19 mg/dL (ref 5–40)

## 2016-11-01 LAB — TSH: TSH: 3.2 u[IU]/mL (ref 0.450–4.500)

## 2016-11-10 ENCOUNTER — Other Ambulatory Visit: Payer: Self-pay | Admitting: Family Medicine

## 2016-11-25 ENCOUNTER — Other Ambulatory Visit: Payer: Self-pay | Admitting: Family Medicine

## 2016-11-25 NOTE — Telephone Encounter (Signed)
Last OV: 10/31/16 Next OV: 02/01/17  BMP Latest Ref Rng & Units 10/31/2016 04/25/2016 01/13/2016  Glucose 65 - 99 mg/dL 409(W) 119(J) 478(G)  BUN 6 - 24 mg/dL Creatinine 0.57 - 1.00 mg/dL 9.56(O) 1.30 8.65  BUN/Creat Ratio 9 - Sodium 134 - 144 mmol/L 140 137 136  Potassium 3.5 - 5.2 mmol/L 4.1 4.7 4.4  Chloride 96 - 106 mmol/L 98 97 96  CO2 18 - 29 mmol/L Calcium 8.7 - 10.2 mg/dL 9.9 9.6 9.5

## 2016-12-15 ENCOUNTER — Telehealth: Payer: Self-pay | Admitting: Family Medicine

## 2016-12-15 MED ORDER — VALACYCLOVIR HCL 1 G PO TABS: 1000.0000 mg | ORAL_TABLET | Freq: Two times a day (BID) | ORAL | 3 refills | Status: DC

## 2016-12-15 NOTE — Telephone Encounter (Signed)
Patient called in regards to request medication due to having two fever blisters located on her bottom lip. Please Advise.  Patient contact: [phone]  Thank you

## 2016-12-15 NOTE — Telephone Encounter (Signed)
rx sent

## 2016-12-15 NOTE — Telephone Encounter (Signed)
Message relayed to patient. Verbalized understanding and denied questions.   

## 2016-12-15 NOTE — Telephone Encounter (Signed)
Please advise 

## 2016-12-26 ENCOUNTER — Other Ambulatory Visit: Payer: Self-pay | Admitting: Family Medicine

## 2017-01-30 ENCOUNTER — Telehealth: Payer: Self-pay

## 2017-01-30 MED ORDER — SIMVASTATIN 40 MG PO TABS: 40.0000 mg | ORAL_TABLET | Freq: Every day | ORAL | 4 refills | Status: DC

## 2017-01-30 NOTE — Telephone Encounter (Signed)
[facility] stating that pt is requesting Simvastatin 40mg  to replace Rosuvastatin 10mg  due to cost. Please advise.

## 2017-01-30 NOTE — Telephone Encounter (Signed)
rx changed

## 2017-02-01 ENCOUNTER — Ambulatory Visit (INDEPENDENT_AMBULATORY_CARE_PROVIDER_SITE_OTHER): Payer: BC Managed Care – PPO | Admitting: Family Medicine

## 2017-02-01 ENCOUNTER — Encounter: Payer: Self-pay | Admitting: Family Medicine

## 2017-02-01 VITALS — BP 120/70 | HR 75 | Wt 186.2 lb

## 2017-02-01 DIAGNOSIS — I10 Essential (primary) hypertension: Secondary | ICD-10-CM | POA: Diagnosis not present

## 2017-02-01 DIAGNOSIS — E7800 Pure hypercholesterolemia, unspecified: Secondary | ICD-10-CM

## 2017-02-01 DIAGNOSIS — E119 Type 2 diabetes mellitus without complications: Secondary | ICD-10-CM

## 2017-02-01 LAB — BAYER DCA HB A1C WAIVED: HB A1C: 5.7 % (ref <7.0)

## 2017-02-01 NOTE — Assessment & Plan Note (Signed)
The current medical regimen is effective;  continue present plan and medications.  

## 2017-02-01 NOTE — Progress Notes (Signed)
BP 120/70 (BP Location: Left Arm)   Pulse 75   Wt 186 lb 3.2 oz (84.5 kg)   SpO2 98%   BMI 28.88 kg/m    Subjective:    Patient ID: Holly Morales, female    DOB: 11/05/1970, 46 y.o.   MRN: [id number]  HPI: Holly Morales is a 46 y.o. female  Chief Complaint  Patient presents with  . Follow-up  . Diabetes  . Hip Pain    Both.    Patient follow-up diabetes noted low blood sugar spells doing well glucose remains doing really well with patient's weight loss and stability of weight. Is maintaining good diet. Wants to continue losing and is ready to start again. Is taking Januvia without problems got a coupon so the price is now free. Is wondering about cutting back on medications. Cholesterol doing well taking Crestor without problems except for cost and medicine was changed to simvastatin which was just done this week Patient with biggest concern is bilateral hip pain is [facility] and is been given ibuprofen and Pepcid combination which helps some. Patient cautioned about her lifting but still bothered by bursitis at the end of the day.  Relevant past medical, surgical, family and social history reviewed and updated as indicated. Interim medical history since our last visit reviewed. Allergies and medications reviewed and updated.  Review of Systems  Constitutional: Negative.   Respiratory: Negative.   Cardiovascular: Negative.     Per HPI unless specifically indicated above     Objective:    BP 120/70 (BP Location: Left Arm)   Pulse 75   Wt 186 lb 3.2 oz (84.5 kg)   SpO2 98%   BMI 28.88 kg/m   Wt Readings from Last 3 Encounters:  02/01/17 186 lb 3.2 oz (84.5 kg)  10/31/16 186 lb 1.6 oz (84.4 kg)  07/27/16 202 lb 6.4 oz (91.8 kg)    Physical Exam  Constitutional: She is oriented to person, place, and time. She appears well-developed and well-nourished.  HENT:  Head: Normocephalic and atraumatic.  Eyes: Conjunctivae and EOM are normal.  Neck: Normal  range of motion.  Cardiovascular: Normal rate, regular rhythm and normal heart sounds.   Pulmonary/Chest: Effort normal and breath sounds normal.  Musculoskeletal: Normal range of motion.  Neurological: She is alert and oriented to person, place, and time.  Skin: No erythema.  Psychiatric: She has a normal mood and affect. Her behavior is normal. Judgment and thought content normal.    Results for orders placed or performed in visit on 10/31/16  Microscopic Examination  Result Value Ref Range   WBC, UA None seen 0 - 5 /hpf   RBC, UA 0-2 0 - 2 /hpf   Epithelial Cells (non renal) 0-10 0 - 10 /hpf   Bacteria, UA None seen None seen/Few  CBC with Differential/Platelet  Result Value Ref Range   WBC 8.1 3.4 - 10.8 x10E3/uL   RBC 4.36 3.77 - 5.28 x10E6/uL   Hemoglobin 14.2 11.1 - 15.9 g/dL   Hematocrit 78.242.5 95.634.0 - 46.6 %   MCV 98 (H) 79 - 97 fL   MCH 32.6 26.6 - 33.0 pg   MCHC 33.4 31.5 - 35.7 g/dL   RDW 21.313.0 08.612.3 - 57.815.4 %   Platelets 285 150 - 379 x10E3/uL   Neutrophils 67 Not Estab. %   Lymphs 23 Not Estab. %   Monocytes 8 Not Estab. %   Eos 2 Not Estab. %   Basos 0 Not  Estab. %   Neutrophils Absolute 5.4 1.4 - 7.0 x10E3/uL   Lymphocytes Absolute 1.9 0.7 - 3.1 x10E3/uL   Monocytes Absolute 0.6 0.1 - 0.9 x10E3/uL   EOS (ABSOLUTE) 0.1 0.0 - 0.4 x10E3/uL   Basophils Absolute 0.0 0.0 - 0.2 x10E3/uL   Immature Granulocytes 0 Not Estab. %   Immature Grans (Abs) 0.0 0.0 - 0.1 x10E3/uL  Comprehensive metabolic panel  Result Value Ref Range   Glucose 114 (H) 65 - 99 mg/dL   BUN 6 6 - 24 mg/dL   Creatinine, Ser 1.61 (L) 0.57 - 1.00 mg/dL   GFR calc non Af Amer 112 >59 mL/min/1.73   GFR calc Af Amer 129 >59 mL/min/1.73   BUN/Creatinine Ratio 11 9 - 23   Sodium 140 134 - 144 mmol/L   Potassium 4.1 3.5 - 5.2 mmol/L   Chloride 98 96 - 106 mmol/L   CO2 25 18 - 29 mmol/L   Calcium 9.9 8.7 - 10.2 mg/dL   Total Protein 7.5 6.0 - 8.5 g/dL   Albumin 4.9 3.5 - 5.5 g/dL   Globulin, Total  2.6 1.5 - 4.5 g/dL   Albumin/Globulin Ratio 1.9 1.2 - 2.2   Bilirubin Total 0.3 0.0 - 1.2 mg/dL   Alkaline Phosphatase 63 39 - 117 IU/L   AST 20 0 - 40 IU/L   ALT 20 0 - 32 IU/L  Lipid panel  Result Value Ref Range   Cholesterol, Total 119 100 - 199 mg/dL   Triglycerides 95 0 - 149 mg/dL   HDL 54 >09 mg/dL   VLDL Cholesterol Cal 19 5 - 40 mg/dL   LDL Calculated 46 0 - 99 mg/dL   Chol/HDL Ratio 2.2 0.0 - 4.4 ratio units  TSH  Result Value Ref Range   TSH 3.200 0.450 - 4.500 uIU/mL  Urinalysis, Routine w reflex microscopic  Result Value Ref Range   Specific Gravity, UA <1.005 (L) 1.005 - 1.030   pH, UA 5.5 5.0 - 7.5   Color, UA Yellow Yellow   Appearance Ur Clear Clear   Leukocytes, UA Negative Negative   Protein, UA Negative Negative/Trace   Glucose, UA Negative Negative   Ketones, UA Negative Negative   RBC, UA 3+ (A) Negative   Bilirubin, UA Negative Negative   Urobilinogen, Ur 0.2 0.2 - 1.0 mg/dL   Nitrite, UA Negative Negative   Microscopic Examination See below:   Hemoglobin A1c  Result Value Ref Range   Hgb A1c MFr Bld 5.8 (H) 4.8 - 5.6 %   Est. average glucose Bld gHb Est-mCnc 120 mg/dL      Assessment & Plan:   Problem List Items Addressed This Visit      Cardiovascular and Mediastinum   Hypertension    The current medical regimen is effective;  continue present plan and medications.       Relevant Orders   Bayer DCA Hb A1c Waived     Endocrine   Diabetes mellitus without complication (HCC) - Primary    Discuss ongoing great control of diabetes with hemoglobin A1c back again even lower. Patient with no low blood sugar spells but may anticipate will continue Januvia 100 mg as that seemed to help really make a change. We will decrease metformin from 1000 twice a day to 500 twice a day and observe blood sugars.      Relevant Orders   Bayer DCA Hb A1c Waived     Other   Hypercholesteremia    The current medical regimen  is effective;  continue present  plan and medications.       Relevant Orders   Bayer DCA Hb A1c Waived       Follow up plan: Return in about 3 months (around 05/04/2017) for Hemoglobin A1c, BMP,  Lipids, ALT, AST.

## 2017-02-01 NOTE — Assessment & Plan Note (Signed)
Discuss ongoing great control of diabetes with hemoglobin A1c back again even lower. Patient with no low blood sugar spells but may anticipate will continue Januvia 100 mg as that seemed to help really make a change. We will decrease metformin from 1000 twice a day to 500 twice a day and observe blood sugars.

## 2017-05-22 ENCOUNTER — Ambulatory Visit (INDEPENDENT_AMBULATORY_CARE_PROVIDER_SITE_OTHER): Payer: BC Managed Care – PPO | Admitting: Family Medicine

## 2017-05-22 ENCOUNTER — Other Ambulatory Visit
Admission: RE | Admit: 2017-05-22 | Discharge: 2017-05-22 | Disposition: A | Discharge status: Home / Self Care | Payer: BC Managed Care – PPO | Source: Ambulatory Visit | Attending: Family Medicine | Admitting: Family Medicine

## 2017-05-22 ENCOUNTER — Encounter: Payer: Self-pay | Admitting: Family Medicine

## 2017-05-22 VITALS — BP 133/78 | HR 77 | Wt 180.0 lb

## 2017-05-22 DIAGNOSIS — E119 Type 2 diabetes mellitus without complications: Secondary | ICD-10-CM

## 2017-05-22 DIAGNOSIS — E7800 Pure hypercholesterolemia, unspecified: Secondary | ICD-10-CM | POA: Insufficient documentation

## 2017-05-22 DIAGNOSIS — I10 Essential (primary) hypertension: Secondary | ICD-10-CM | POA: Insufficient documentation

## 2017-05-22 LAB — BASIC METABOLIC PANEL
ANION GAP: 10 (ref 5–15)
BUN: 12 mg/dL (ref 6–20)
CALCIUM: 9.8 mg/dL (ref 8.9–10.3)
CO2: 28 mmol/L (ref 22–32)
Chloride: 101 mmol/L (ref 101–111)
Creatinine, Ser: 0.65 mg/dL (ref 0.44–1.00)
GFR calc Af Amer: >60 mL/min (ref >60)
GLUCOSE: 112 mg/dL — AB (ref 65–99)
Potassium: 4.9 mmol/L (ref 3.5–5.1)
Sodium: 139 mmol/L (ref 135–145)

## 2017-05-22 LAB — LIPID PANEL
CHOL/HDL RATIO: 2.4 ratio
Cholesterol: 133 mg/dL (ref 0–200)
HDL: 55 mg/dL (ref >40)
LDL CALC: 65 mg/dL (ref 0–99)
Triglycerides: 66 mg/dL (ref <150)
VLDL: 13 mg/dL (ref 0–40)

## 2017-05-22 LAB — HEMOGLOBIN A1C
HEMOGLOBIN A1C: 5.9 % — AB (ref 4.8–5.6)
MEAN PLASMA GLUCOSE: 122.63 mg/dL

## 2017-05-22 LAB — ALT: ALT: 20 U/L (ref 14–54)

## 2017-05-22 LAB — AST: AST: 18 U/L (ref 15–41)

## 2017-05-22 MED ORDER — VARENICLINE TARTRATE 0.5 MG X 11 & 1 MG X 42 PO MISC: ORAL | 0 refills | Status: DC

## 2017-05-22 MED ORDER — SITAGLIPTIN PHOSPHATE 100 MG PO TABS: 100.0000 mg | ORAL_TABLET | Freq: Every day | ORAL | 3 refills | Status: DC

## 2017-05-22 MED ORDER — SIMVASTATIN 40 MG PO TABS: 40.0000 mg | ORAL_TABLET | Freq: Every day | ORAL | 2 refills | Status: DC

## 2017-05-22 MED ORDER — VARENICLINE TARTRATE 1 MG PO TABS: 1.0000 mg | ORAL_TABLET | Freq: Two times a day (BID) | ORAL | 4 refills | Status: DC

## 2017-05-22 NOTE — Assessment & Plan Note (Signed)
The current medical regimen is effective;  continue present plan and medications.  

## 2017-05-22 NOTE — Progress Notes (Signed)
BP 133/78   Pulse 77   Wt 180 lb (81.6 kg)   SpO2 98%   BMI 27.92 kg/m    Subjective:    Patient ID: Holly Morales, female    DOB: 1971-07-02, 46 y.o.   MRN: [id number]  HPI: Holly Morales is a 46 y.o. female  Chief Complaint  Patient presents with  . Follow-up  . Diabetes  . Foot Injury    Knot on right foot, noticed 02/05/17. Not painful, not ichy  patient several concerns developed a ganglion cyst on her right lateral  Foot area no known trauma or irritation.  Full range of motion nonpainful. Diabetes doing well has continued to lose weight.No low blood sugar spells.  Patient's taking metformin 500 twice a day and Januvia 100 mg a day without problems. Due to insurance reasons patient's now on simvastatin will need to check lipids and liver on simvastatin. Then will need refills until physical exam time this winter.  Relevant past medical, surgical, family and social history reviewed and updated as indicated. Interim medical history since our last visit reviewed. Allergies and medications reviewed and updated.  Review of Systems  Constitutional: Negative.   Respiratory: Negative.   Cardiovascular: Negative.     Per HPI unless specifically indicated above     Objective:    BP 133/78   Pulse 77   Wt 180 lb (81.6 kg)   SpO2 98%   BMI 27.92 kg/m   Wt Readings from Last 3 Encounters:  05/22/17 180 lb (81.6 kg)  02/01/17 186 lb 3.2 oz (84.5 kg)  10/31/16 186 lb 1.6 oz (84.4 kg)    Physical Exam  Constitutional: She is oriented to person, place, and time. She appears well-developed and well-nourished.  HENT:  Head: Normocephalic and atraumatic.  Eyes: Conjunctivae and EOM are normal.  Neck: Normal range of motion.  Cardiovascular: Normal rate, regular rhythm and normal heart sounds.   Pulmonary/Chest: Effort normal and breath sounds normal.  Musculoskeletal: Normal range of motion.  Neurological: She is alert and oriented to person, place, and time.    Skin: No erythema.  Psychiatric: She has a normal mood and affect. Her behavior is normal. Judgment and thought content normal.    Results for orders placed or performed in visit on 02/01/17  Bayer DCA Hb A1c Waived  Result Value Ref Range   Bayer DCA Hb A1c Waived 5.7 <7.0 %      Assessment & Plan:   Problem List Items Addressed This Visit      Cardiovascular and Mediastinum   Hypertension - Primary    The current medical regimen is effective;  continue present plan and medications.       Relevant Medications   simvastatin (ZOCOR) 40 MG tablet   Other Relevant Orders   Hemoglobin A1c   Basic metabolic panel     Endocrine   Diabetes mellitus without complication (HCC)    The current medical regimen is effective;  continue present plan and medications.       Relevant Medications   simvastatin (ZOCOR) 40 MG tablet   sitaGLIPtin (JANUVIA) 100 MG tablet   Other Relevant Orders   Hemoglobin A1c   Basic metabolic panel     Other   Hypercholesteremia    The current medical regimen is effective;  continue present plan and medications.       Relevant Medications   simvastatin (ZOCOR) 40 MG tablet       Follow up plan: Return  in about 6 months (around 11/20/2017) for Physical Exam.

## 2017-05-23 ENCOUNTER — Encounter: Payer: Self-pay | Admitting: Family Medicine

## 2017-06-23 ENCOUNTER — Other Ambulatory Visit: Payer: Self-pay | Admitting: Unknown Physician Specialty

## 2017-08-29 ENCOUNTER — Other Ambulatory Visit: Payer: Self-pay | Admitting: Family Medicine

## 2017-08-29 DIAGNOSIS — Z1231 Encounter for screening mammogram for malignant neoplasm of breast: Secondary | ICD-10-CM

## 2017-09-19 ENCOUNTER — Encounter: Payer: Self-pay | Admitting: Family Medicine

## 2017-10-23 ENCOUNTER — Ambulatory Visit
Admission: RE | Admit: 2017-10-23 | Discharge: 2017-10-23 | Disposition: A | Discharge status: Home / Self Care | Payer: BC Managed Care – PPO | Source: Ambulatory Visit | Attending: Family Medicine | Admitting: Family Medicine

## 2017-10-23 DIAGNOSIS — Z1231 Encounter for screening mammogram for malignant neoplasm of breast: Secondary | ICD-10-CM | POA: Insufficient documentation

## 2017-11-22 ENCOUNTER — Encounter: Payer: BC Managed Care – PPO | Admitting: Family Medicine

## 2017-11-30 ENCOUNTER — Encounter: Payer: Self-pay | Admitting: Family Medicine

## 2017-11-30 ENCOUNTER — Ambulatory Visit (INDEPENDENT_AMBULATORY_CARE_PROVIDER_SITE_OTHER): Payer: BC Managed Care – PPO | Admitting: Family Medicine

## 2017-11-30 VITALS — BP 124/85 | HR 80 | Ht 65.75 in | Wt 182.0 lb

## 2017-11-30 DIAGNOSIS — E7800 Pure hypercholesterolemia, unspecified: Secondary | ICD-10-CM | POA: Diagnosis not present

## 2017-11-30 DIAGNOSIS — M7071 Other bursitis of hip, right hip: Secondary | ICD-10-CM | POA: Insufficient documentation

## 2017-11-30 DIAGNOSIS — Z0000 Encounter for general adult medical examination without abnormal findings: Secondary | ICD-10-CM

## 2017-11-30 DIAGNOSIS — E119 Type 2 diabetes mellitus without complications: Secondary | ICD-10-CM

## 2017-11-30 DIAGNOSIS — I10 Essential (primary) hypertension: Secondary | ICD-10-CM

## 2017-11-30 DIAGNOSIS — M7061 Trochanteric bursitis, right hip: Secondary | ICD-10-CM | POA: Diagnosis not present

## 2017-11-30 DIAGNOSIS — F3342 Major depressive disorder, recurrent, in full remission: Secondary | ICD-10-CM | POA: Diagnosis not present

## 2017-11-30 DIAGNOSIS — Z1329 Encounter for screening for other suspected endocrine disorder: Secondary | ICD-10-CM | POA: Diagnosis not present

## 2017-11-30 LAB — URINALYSIS, ROUTINE W REFLEX MICROSCOPIC
BILIRUBIN UA: NEGATIVE
Glucose, UA: NEGATIVE
Ketones, UA: NEGATIVE
Leukocytes, UA: NEGATIVE
NITRITE UA: NEGATIVE
Protein, UA: NEGATIVE
RBC UA: NEGATIVE
Urobilinogen, Ur: 0.2 mg/dL (ref 0.2–1.0)
pH, UA: 6 (ref 5.0–7.5)

## 2017-11-30 LAB — BAYER DCA HB A1C WAIVED: HB A1C: 5.3 % (ref <7.0)

## 2017-11-30 MED ORDER — MELOXICAM 15 MG PO TABS: 15.0000 mg | ORAL_TABLET | Freq: Every day | ORAL | 0 refills | Status: DC

## 2017-11-30 MED ORDER — SIMVASTATIN 40 MG PO TABS: 40.0000 mg | ORAL_TABLET | Freq: Every day | ORAL | 2 refills | Status: DC

## 2017-11-30 MED ORDER — ONETOUCH DELICA LANCETS 33G MISC: 12 refills | Status: DC

## 2017-11-30 MED ORDER — METFORMIN HCL 500 MG PO TABS: 1000.0000 mg | ORAL_TABLET | Freq: Two times a day (BID) | ORAL | 4 refills | Status: DC

## 2017-11-30 MED ORDER — LISINOPRIL 10 MG PO TABS: 10.0000 mg | ORAL_TABLET | Freq: Every day | ORAL | 6 refills | Status: DC

## 2017-11-30 MED ORDER — GLUCOSE BLOOD VI STRP: ORAL_STRIP | 12 refills | Status: DC

## 2017-11-30 NOTE — Assessment & Plan Note (Signed)
The current medical regimen is effective;  continue present plan and medications.  

## 2017-11-30 NOTE — Progress Notes (Signed)
BP 124/85   Pulse 80   Ht 5' 5.75" (1.67 m)   Wt 182 lb (82.6 kg)   SpO2 98%   BMI 29.60 kg/m    Subjective:    Patient ID: Holly Morales, female    DOB: May 29, 1971, 47 y.o.   MRN: [id number]  HPI: Holly Morales is a 47 y.o. female  Chief Complaint  Patient presents with  . Annual Exam  With complaints of right third fourth toes numb.  This is area of her foot that had a ganglion cyst from last year now but gone but not bothersome enough for referral to podiatry. Also starts on her left arm at night laying on it wakes up later with numb and tingly and throbbing sensation after he gets off of it. Also has right hip bursitis is been using ibuprofen but needs more.  Patient not ready for physical therapy at this point will need some other treatments. She is also with some perimenopausal symptoms irregular periods. Diabetes doing well cholesterol also doing well no complaints.  Relevant past medical, surgical, family and social history reviewed and updated as indicated. Interim medical history since our last visit reviewed. Allergies and medications reviewed and updated.  Review of Systems  Constitutional: Negative.   HENT: Negative.   Eyes: Negative.   Respiratory: Negative.   Cardiovascular: Negative.   Gastrointestinal: Negative.   Endocrine: Negative.   Genitourinary: Negative.   Musculoskeletal: Negative.   Skin: Negative.   Allergic/Immunologic: Negative.   Neurological: Negative.   Hematological: Negative.   Psychiatric/Behavioral: Negative.     Per HPI unless specifically indicated above     Objective:    BP 124/85   Pulse 80   Ht 5' 5.75" (1.67 m)   Wt 182 lb (82.6 kg)   SpO2 98%   BMI 29.60 kg/m   Wt Readings from Last 3 Encounters:  11/30/17 182 lb (82.6 kg)  05/22/17 180 lb (81.6 kg)  02/01/17 186 lb 3.2 oz (84.5 kg)    Physical Exam  Constitutional: She is oriented to person, place, and time. She appears well-developed and well-nourished.    HENT:  Head: Normocephalic and atraumatic.  Right Ear: External ear normal.  Left Ear: External ear normal.  Nose: Nose normal.  Mouth/Throat: Oropharynx is clear and moist.  Eyes: Pupils are equal, round, and reactive to light. Conjunctivae and EOM are normal.  Neck: Normal range of motion. Neck supple. Carotid bruit is not present.  Cardiovascular: Normal rate, regular rhythm and normal heart sounds.  No murmur heard. Pulmonary/Chest: Effort normal and breath sounds normal. She exhibits no mass. Right breast exhibits no mass, no skin change and no tenderness. Left breast exhibits no mass, no skin change and no tenderness. Breasts are symmetrical.  Abdominal: Soft. Bowel sounds are normal. There is no hepatosplenomegaly.  Musculoskeletal: Normal range of motion.  Neurological: She is alert and oriented to person, place, and time.  Skin: No rash noted.  Psychiatric: She has a normal mood and affect. Her behavior is normal. Judgment and thought content normal.    Results for orders placed or performed during the hospital encounter of 05/22/17  Hemoglobin A1c  Result Value Ref Range   Hgb A1c MFr Bld 5.9 (H) 4.8 - 5.6 %   Mean Plasma Glucose 122.63 mg/dL  Basic metabolic panel  Result Value Ref Range   Sodium 139 135 - 145 mmol/L   Potassium 4.9 3.5 - 5.1 mmol/L   Chloride 101 101 - 111  mmol/L   CO2 28 22 - 32 mmol/L   Glucose, Bld 112 (H) 65 - 99 mg/dL   BUN 12 6 - 20 mg/dL   Creatinine, Ser 3.660.65 0.44 - 1.00 mg/dL   Calcium 9.8 8.9 - 44.010.3 mg/dL   GFR calc non Af Amer >60 >60 mL/min   GFR calc Af Amer >60 >60 mL/min   Anion gap 10 5 - 15  Lipid panel  Result Value Ref Range   Cholesterol 133 0 - 200 mg/dL   Triglycerides 66 <347<150 mg/dL   HDL 55 >42>40 mg/dL   Total CHOL/HDL Ratio 2.4 RATIO   VLDL 13 0 - 40 mg/dL   LDL Cholesterol 65 0 - 99 mg/dL  ALT  Result Value Ref Range   ALT 20 14 - 54 U/L  AST  Result Value Ref Range   AST 18 15 - 41 U/L      Assessment & Plan:    Problem List Items Addressed This Visit      Cardiovascular and Mediastinum   Hypertension - Primary    The current medical regimen is effective;  continue present plan and medications.       Relevant Medications   simvastatin (ZOCOR) 40 MG tablet   lisinopril (PRINIVIL,ZESTRIL) 10 MG tablet   Other Relevant Orders   CBC with Differential/Platelet   Comprehensive metabolic panel   Urinalysis, Routine w reflex microscopic   Bayer DCA Hb A1c Waived     Endocrine   Diabetes mellitus without complication (HCC)    Discussed low blood sugars with weight loss.  Patient will continue weight loss decrease and stop Januvia and observe blood sugars.  Check A1c 3 months      Relevant Medications   simvastatin (ZOCOR) 40 MG tablet   metFORMIN (GLUCOPHAGE) 500 MG tablet   lisinopril (PRINIVIL,ZESTRIL) 10 MG tablet   Other Relevant Orders   CBC with Differential/Platelet   Comprehensive metabolic panel   Urinalysis, Routine w reflex microscopic   Bayer DCA Hb A1c Waived     Musculoskeletal and Integument   Bursitis of hip, right    Discussed bursitis care and treatment will use meloxicam        Other   Depression    The current medical regimen is effective;  continue present plan and medications.       Hypercholesteremia    The current medical regimen is effective;  continue present plan and medications.       Relevant Medications   simvastatin (ZOCOR) 40 MG tablet   lisinopril (PRINIVIL,ZESTRIL) 10 MG tablet   Other Relevant Orders   Lipid panel   Bayer DCA Hb A1c Waived    Other Visit Diagnoses    Annual physical exam       Relevant Orders   CBC with Differential/Platelet   Comprehensive metabolic panel   Lipid panel   TSH   Urinalysis, Routine w reflex microscopic   Thyroid disorder screen       Relevant Orders   TSH    With patient's periods being irregular discussed perimenopausal  Follow up plan: Return in about 3 months (around 03/01/2018) for  Hemoglobin A1c.

## 2017-11-30 NOTE — Assessment & Plan Note (Signed)
Discussed bursitis care and treatment will use meloxicam

## 2017-11-30 NOTE — Assessment & Plan Note (Addendum)
Discussed low blood sugars with weight loss.  Patient will continue weight loss decrease and stop Januvia and observe blood sugars.  Check A1c 3 months

## 2017-12-01 LAB — CBC WITH DIFFERENTIAL/PLATELET
BASOS ABS: 0 10*3/uL (ref 0.0–0.2)
Basos: 0 %
EOS (ABSOLUTE): 0.2 10*3/uL (ref 0.0–0.4)
Eos: 2 %
HEMOGLOBIN: 13.5 g/dL (ref 11.1–15.9)
Hematocrit: 42 % (ref 34.0–46.6)
IMMATURE GRANS (ABS): 0 10*3/uL (ref 0.0–0.1)
IMMATURE GRANULOCYTES: 0 %
LYMPHS: 20 %
Lymphocytes Absolute: 1.8 10*3/uL (ref 0.7–3.1)
MCH: 33.5 pg — ABNORMAL HIGH (ref 26.6–33.0)
MCHC: 32.1 g/dL (ref 31.5–35.7)
MCV: 104 fL — ABNORMAL HIGH (ref 79–97)
MONOCYTES: 8 %
Monocytes Absolute: 0.7 10*3/uL (ref 0.1–0.9)
NEUTROS PCT: 70 %
Neutrophils Absolute: 6.3 10*3/uL (ref 1.4–7.0)
PLATELETS: 244 10*3/uL (ref 150–379)
RBC: 4.03 x10E6/uL (ref 3.77–5.28)
RDW: 14 % (ref 12.3–15.4)
WBC: 9.1 10*3/uL (ref 3.4–10.8)

## 2017-12-01 LAB — LIPID PANEL
CHOLESTEROL TOTAL: 131 mg/dL (ref 100–199)
Chol/HDL Ratio: 2 ratio (ref 0.0–4.4)
HDL: 65 mg/dL (ref >39)
LDL CALC: 53 mg/dL (ref 0–99)
Triglycerides: 63 mg/dL (ref 0–149)
VLDL CHOLESTEROL CAL: 13 mg/dL (ref 5–40)

## 2017-12-01 LAB — COMPREHENSIVE METABOLIC PANEL
ALBUMIN: 4.7 g/dL (ref 3.5–5.5)
ALT: 19 IU/L (ref 0–32)
AST: 17 IU/L (ref 0–40)
Albumin/Globulin Ratio: 2.1 (ref 1.2–2.2)
Alkaline Phosphatase: 56 IU/L (ref 39–117)
BUN / CREAT RATIO: 19 (ref 9–23)
BUN: 12 mg/dL (ref 6–24)
Bilirubin Total: 0.5 mg/dL (ref 0.0–1.2)
CO2: 25 mmol/L (ref 20–29)
CREATININE: 0.64 mg/dL (ref 0.57–1.00)
Calcium: 9.6 mg/dL (ref 8.7–10.2)
Chloride: 101 mmol/L (ref 96–106)
GFR calc non Af Amer: 106 mL/min/{1.73_m2} (ref >59)
GFR, EST AFRICAN AMERICAN: 122 mL/min/{1.73_m2} (ref >59)
GLUCOSE: 83 mg/dL (ref 65–99)
Globulin, Total: 2.2 g/dL (ref 1.5–4.5)
Potassium: 4 mmol/L (ref 3.5–5.2)
Sodium: 140 mmol/L (ref 134–144)
Total Protein: 6.9 g/dL (ref 6.0–8.5)

## 2017-12-01 LAB — TSH: TSH: 2.1 u[IU]/mL (ref 0.450–4.500)

## 2017-12-04 ENCOUNTER — Encounter: Payer: Self-pay | Admitting: Family Medicine

## 2017-12-26 ENCOUNTER — Other Ambulatory Visit: Payer: Self-pay | Admitting: Family Medicine

## 2017-12-27 NOTE — Telephone Encounter (Signed)
Meloxicam refill Last OV: 11/30/17 Last Refill:11/30/17 #30 tab No RF Pharmacy:[facility] [address]  PCP: Dr Dossie Arbour

## 2018-01-15 ENCOUNTER — Other Ambulatory Visit: Payer: Self-pay | Admitting: Family Medicine

## 2018-01-16 NOTE — Telephone Encounter (Signed)
Valtrex 1000 mg refill request  LOV 11/30/17 with Dr. Dossie Arbourrissman  [facility] [address] - [place], [place] - [address]

## 2018-01-30 ENCOUNTER — Other Ambulatory Visit: Payer: Self-pay | Admitting: Family Medicine

## 2018-02-26 ENCOUNTER — Other Ambulatory Visit: Payer: Self-pay | Admitting: Family Medicine

## 2018-02-28 NOTE — Telephone Encounter (Signed)
Mobic 15 mg refill request  Has appt on 03/01/18 at 4:00 with Dr Dossie Arbourrissman  [facility] [address] [facility]

## 2018-03-01 ENCOUNTER — Encounter: Payer: Self-pay | Admitting: Family Medicine

## 2018-03-01 ENCOUNTER — Ambulatory Visit (INDEPENDENT_AMBULATORY_CARE_PROVIDER_SITE_OTHER): Payer: BC Managed Care – PPO | Admitting: Family Medicine

## 2018-03-01 DIAGNOSIS — E119 Type 2 diabetes mellitus without complications: Secondary | ICD-10-CM | POA: Diagnosis not present

## 2018-03-01 DIAGNOSIS — M7061 Trochanteric bursitis, right hip: Secondary | ICD-10-CM | POA: Diagnosis not present

## 2018-03-01 DIAGNOSIS — I10 Essential (primary) hypertension: Secondary | ICD-10-CM | POA: Diagnosis not present

## 2018-03-01 MED ORDER — CYCLOBENZAPRINE HCL 5 MG PO TABS: 5.0000 mg | ORAL_TABLET | Freq: Three times a day (TID) | ORAL | 2 refills | Status: DC | PRN

## 2018-03-01 NOTE — Progress Notes (Signed)
BP 129/87   Pulse 75   Ht 5\' 6"  (1.676 m)   Wt 189 lb 9.6 oz (86 kg)   SpO2 98%   BMI 30.60 kg/m    Subjective:    Patient ID: Holly Morales, female    DOB: 1970/10/16, 47 y.o.   MRN: [id number]  HPI: Holly Morales is a 47 y.o. female  Chief Complaint  Patient presents with  . Follow-up  . Diabetes  . Hip Pain    Left    Patient with ongoing hip pain is had right hip pain now worse in her left hip pain keeping her awake at night tried some Flexeril which seem to help 5 mg dose because she was able to sleep.  Has been taking meloxicam at bedtime will try during the day. Patient is gained a little bit of weight has stopped Januvia but otherwise diabetes seems to be doing well. Blood pressure doing well with no complaints.  Relevant past medical, surgical, family and social history reviewed and updated as indicated. Interim medical history since our last visit reviewed. Allergies and medications reviewed and updated.  Review of Systems  Constitutional: Negative.   Respiratory: Negative.   Cardiovascular: Negative.     Per HPI unless specifically indicated above     Objective:    BP 129/87   Pulse 75   Ht 5\' 6"  (1.676 m)   Wt 189 lb 9.6 oz (86 kg)   SpO2 98%   BMI 30.60 kg/m   Wt Readings from Last 3 Encounters:  03/01/18 189 lb 9.6 oz (86 kg)  11/30/17 182 lb (82.6 kg)  05/22/17 180 lb (81.6 kg)    Physical Exam  Constitutional: She is oriented to person, place, and time. She appears well-developed and well-nourished.  HENT:  Head: Normocephalic and atraumatic.  Eyes: Conjunctivae and EOM are normal.  Neck: Normal range of motion.  Cardiovascular: Normal rate, regular rhythm and normal heart sounds.  Pulmonary/Chest: Effort normal and breath sounds normal.  Musculoskeletal: Normal range of motion.  Neurological: She is alert and oriented to person, place, and time.  Skin: No erythema.  Psychiatric: She has a normal mood and affect. Her behavior is  normal. Judgment and thought content normal.    Results for orders placed or performed in visit on 11/30/17  CBC with Differential/Platelet  Result Value Ref Range   WBC 9.1 3.4 - 10.8 x10E3/uL   RBC 4.03 3.77 - 5.28 x10E6/uL   Hemoglobin 13.5 11.1 - 15.9 g/dL   Hematocrit 40.9 81.1 - 46.6 %   MCV 104 (H) 79 - 97 fL   MCH 33.5 (H) 26.6 - 33.0 pg   MCHC 32.1 31.5 - 35.7 g/dL   RDW 91.4 78.2 - 95.6 %   Platelets 244 150 - 379 x10E3/uL   Neutrophils 70 Not Estab. %   Lymphs 20 Not Estab. %   Monocytes 8 Not Estab. %   Eos 2 Not Estab. %   Basos 0 Not Estab. %   Neutrophils Absolute 6.3 1.4 - 7.0 x10E3/uL   Lymphocytes Absolute 1.8 0.7 - 3.1 x10E3/uL   Monocytes Absolute 0.7 0.1 - 0.9 x10E3/uL   EOS (ABSOLUTE) 0.2 0.0 - 0.4 x10E3/uL   Basophils Absolute 0.0 0.0 - 0.2 x10E3/uL   Immature Granulocytes 0 Not Estab. %   Immature Grans (Abs) 0.0 0.0 - 0.1 x10E3/uL  Comprehensive metabolic panel  Result Value Ref Range   Glucose 83 65 - 99 mg/dL   BUN 12  6 - 24 mg/dL   Creatinine, Ser 1.610.64 0.57 - 1.00 mg/dL   GFR calc non Af Amer 106 >59 mL/min/1.73   GFR calc Af Amer 122 >59 mL/min/1.73   BUN/Creatinine Ratio 19 9 - 23   Sodium 140 134 - 144 mmol/L   Potassium 4.0 3.5 - 5.2 mmol/L   Chloride 101 96 - 106 mmol/L   CO2 25 20 - 29 mmol/L   Calcium 9.6 8.7 - 10.2 mg/dL   Total Protein 6.9 6.0 - 8.5 g/dL   Albumin 4.7 3.5 - 5.5 g/dL   Globulin, Total 2.2 1.5 - 4.5 g/dL   Albumin/Globulin Ratio 2.1 1.2 - 2.2   Bilirubin Total 0.5 0.0 - 1.2 mg/dL   Alkaline Phosphatase 56 39 - 117 IU/L   AST 17 0 - 40 IU/L   ALT 19 0 - 32 IU/L  Lipid panel  Result Value Ref Range   Cholesterol, Total 131 100 - 199 mg/dL   Triglycerides 63 0 - 149 mg/dL   HDL 65 >09>39 mg/dL   VLDL Cholesterol Cal 13 5 - 40 mg/dL   LDL Calculated 53 0 - 99 mg/dL   Chol/HDL Ratio 2.0 0.0 - 4.4 ratio  TSH  Result Value Ref Range   TSH 2.100 0.450 - 4.500 uIU/mL  Urinalysis, Routine w reflex microscopic  Result  Value Ref Range   Specific Gravity, UA <1.005 (L) 1.005 - 1.030   pH, UA 6.0 5.0 - 7.5   Color, UA Straw Yellow   Appearance Ur Clear Clear   Leukocytes, UA Negative Negative   Protein, UA Negative Negative/Trace   Glucose, UA Negative Negative   Ketones, UA Negative Negative   RBC, UA Negative Negative   Bilirubin, UA Negative Negative   Urobilinogen, Ur 0.2 0.2 - 1.0 mg/dL   Nitrite, UA Negative Negative  Bayer DCA Hb A1c Waived  Result Value Ref Range   HB A1C (BAYER DCA - WAIVED) 5.3 <7.0 %      Assessment & Plan:   Problem List Items Addressed This Visit      Cardiovascular and Mediastinum   Hypertension    The current medical regimen is effective;  continue present plan and medications.         Endocrine   Diabetes mellitus without complication (HCC)    The current medical regimen is effective;  continue present plan and medications.         Musculoskeletal and Integument   Bursitis of hip, right    For ongoing bursitis patient will go back to orthopedics to be checked.  Try meloxicam in the morning will give Flexeril          Follow up plan: Return in about 3 months (around 06/01/2018) for BMP,  Lipids, ALT, AST, Hemoglobin A1c.

## 2018-03-01 NOTE — Assessment & Plan Note (Signed)
For ongoing bursitis patient will go back to orthopedics to be checked.  Try meloxicam in the morning will give Flexeril

## 2018-03-01 NOTE — Assessment & Plan Note (Signed)
The current medical regimen is effective;  continue present plan and medications.  

## 2018-05-03 ENCOUNTER — Encounter: Payer: Self-pay | Admitting: Physician Assistant

## 2018-05-03 ENCOUNTER — Ambulatory Visit: Payer: BC Managed Care – PPO | Admitting: Physician Assistant

## 2018-05-03 VITALS — BP 138/84 | HR 91 | Temp 98.4°F | Ht 66.0 in | Wt 186.0 lb

## 2018-05-03 DIAGNOSIS — E119 Type 2 diabetes mellitus without complications: Secondary | ICD-10-CM

## 2018-05-03 DIAGNOSIS — R42 Dizziness and giddiness: Secondary | ICD-10-CM

## 2018-05-03 DIAGNOSIS — R55 Syncope and collapse: Secondary | ICD-10-CM | POA: Diagnosis not present

## 2018-05-03 NOTE — Progress Notes (Signed)
Subjective:    Patient ID: Holly Morales, female    DOB: 1970/11/11, 47 y.o.   MRN: [id number]  Holly Morales is a 47 y.o. female presenting on 05/03/2018 for Dizziness (pt states she has been having dizzy and lightheaded spells since Monday )   HPI   History of HTN, DM II, mitral and aortic regurg. Complains of feeling "swimmy headed" x 1 week. She says episodes occur when she turns over in bed, tilts her head up in the shower. However, she reports SHE feels off balance rather than the room spinning. Also reports that she has bent down and while she has been bending down she blacked out. She denies chest pain, palpitations. She denies one sided weakness, vision change, difficulty speaking. She denies history of low blood sugars, tremors. She does smoke.   Social History   Tobacco Use  . Smoking status: Current Every Day Smoker    Packs/day: 0.50    Types: Cigarettes  . Smokeless tobacco: Never Used  Substance Use Topics  . Alcohol use: Yes    Alcohol/week: 1.0 standard drinks    Types: 1 Standard drinks or equivalent per week    Comment: on occasion  . Drug use: No    Review of Systems Per HPI unless specifically indicated above     Objective:    BP 138/84   Pulse 91   Temp 98.4 F (36.9 C) (Oral)   Ht '5\' 6"'$  (1.676 m)   Wt 186 lb (84.4 kg)   SpO2 98%   BMI 30.02 kg/m   Wt Readings from Last 3 Encounters:  05/03/18 186 lb (84.4 kg)  03/01/18 189 lb 9.6 oz (86 kg)  11/30/17 182 lb (82.6 kg)    Physical Exam  Constitutional: She is oriented to person, place, and time. She appears well-developed and well-nourished.  HENT:  Right Ear: Tympanic membrane and external ear normal.  Left Ear: Tympanic membrane and external ear normal.  No nystagmus on EOM. Symptoms replicated with turning head and lying back. Orthostatic vitals normal.   Eyes: Pupils are equal, round, and reactive to light. EOM are normal.  Neck: Carotid bruit is not present.  Neurological: She is alert  and oriented to person, place, and time. She displays normal reflexes. No cranial nerve deficit or sensory deficit. She exhibits normal muscle tone. Coordination normal.  Skin: Skin is warm and dry.  Psychiatric: She has a normal mood and affect. Her behavior is normal.   Results for orders placed or performed in visit on 11/30/17  CBC with Differential/Platelet  Result Value Ref Range   WBC 9.1 3.4 - 10.8 x10E3/uL   RBC 4.03 3.77 - 5.28 x10E6/uL   Hemoglobin 13.5 11.1 - 15.9 g/dL   Hematocrit 42.0 34.0 - 46.6 %   MCV 104 (H) 79 - 97 fL   MCH 33.5 (H) 26.6 - 33.0 pg   MCHC 32.1 31.5 - 35.7 g/dL   RDW 14.0 12.3 - 15.4 %   Platelets 244 150 - 379 x10E3/uL   Neutrophils 70 Not Estab. %   Lymphs 20 Not Estab. %   Monocytes 8 Not Estab. %   Eos 2 Not Estab. %   Basos 0 Not Estab. %   Neutrophils Absolute 6.3 1.4 - 7.0 x10E3/uL   Lymphocytes Absolute 1.8 0.7 - 3.1 x10E3/uL   Monocytes Absolute 0.7 0.1 - 0.9 x10E3/uL   EOS (ABSOLUTE) 0.2 0.0 - 0.4 x10E3/uL   Basophils Absolute 0.0 0.0 - 0.2 x10E3/uL  Immature Granulocytes 0 Not Estab. %   Immature Grans (Abs) 0.0 0.0 - 0.1 x10E3/uL  Comprehensive metabolic panel  Result Value Ref Range   Glucose 83 65 - 99 mg/dL   BUN 12 6 - 24 mg/dL   Creatinine, Ser 0.64 0.57 - 1.00 mg/dL   GFR calc non Af Amer 106 >59 mL/min/1.73   GFR calc Af Amer 122 >59 mL/min/1.73   BUN/Creatinine Ratio 19 9 - 23   Sodium 140 134 - 144 mmol/L   Potassium 4.0 3.5 - 5.2 mmol/L   Chloride 101 96 - 106 mmol/L   CO2 25 20 - 29 mmol/L   Calcium 9.6 8.7 - 10.2 mg/dL   Total Protein 6.9 6.0 - 8.5 g/dL   Albumin 4.7 3.5 - 5.5 g/dL   Globulin, Total 2.2 1.5 - 4.5 g/dL   Albumin/Globulin Ratio 2.1 1.2 - 2.2   Bilirubin Total 0.5 0.0 - 1.2 mg/dL   Alkaline Phosphatase 56 39 - 117 IU/L   AST 17 0 - 40 IU/L   ALT 19 0 - 32 IU/L  Lipid panel  Result Value Ref Range   Cholesterol, Total 131 100 - 199 mg/dL   Triglycerides 63 0 - 149 mg/dL   HDL 65 >39 mg/dL    VLDL Cholesterol Cal 13 5 - 40 mg/dL   LDL Calculated 53 0 - 99 mg/dL   Chol/HDL Ratio 2.0 0.0 - 4.4 ratio  TSH  Result Value Ref Range   TSH 2.100 0.450 - 4.500 uIU/mL  Urinalysis, Routine w reflex microscopic  Result Value Ref Range   Specific Gravity, UA <1.005 (L) 1.005 - 1.030   pH, UA 6.0 5.0 - 7.5   Color, UA Straw Yellow   Appearance Ur Clear Clear   Leukocytes, UA Negative Negative   Protein, UA Negative Negative/Trace   Glucose, UA Negative Negative   Ketones, UA Negative Negative   RBC, UA Negative Negative   Bilirubin, UA Negative Negative   Urobilinogen, Ur 0.2 0.2 - 1.0 mg/dL   Nitrite, UA Negative Negative  Bayer DCA Hb A1c Waived  Result Value Ref Range   HB A1C (BAYER DCA - WAIVED) 5.3 <7.0 %      Assessment & Plan:  1. Dizziness  Orthostatic vitals negative, does not seem consistent with low blood sugars. Does have some characteristics of vertigo such as symptom onset with head motion, but not all symptoms fit with this. She also reports blacking out when she has bent down or also feeling similarly when she tilts her head upwards. Would like her to be seen by specialist.   - Ambulatory referral to Neurology - CBC with Differential - Comp Met (CMET)  2. Syncope, unspecified syncope type  - Ambulatory referral to Neurology - CBC with Differential - Comp Met (CMET)  3. Diabetes mellitus without complication (HCC)  - HgB A1c    Follow up plan: Return if symptoms worsen or fail to improve.  Carles Collet, PA-C [facility] 05/03/2018, 11:56 AM

## 2018-05-03 NOTE — Patient Instructions (Signed)

## 2018-05-04 LAB — CBC WITH DIFFERENTIAL/PLATELET
Basophils Absolute: 0.1 10*3/uL (ref 0.0–0.2)
Basos: 1 %
EOS (ABSOLUTE): 0.2 10*3/uL (ref 0.0–0.4)
Eos: 3 %
Hematocrit: 40.3 % (ref 34.0–46.6)
Hemoglobin: 13.8 g/dL (ref 11.1–15.9)
Immature Grans (Abs): 0 10*3/uL (ref 0.0–0.1)
Immature Granulocytes: 0 %
Lymphocytes Absolute: 1.8 10*3/uL (ref 0.7–3.1)
Lymphs: 26 %
MCH: 34 pg — ABNORMAL HIGH (ref 26.6–33.0)
MCHC: 34.2 g/dL (ref 31.5–35.7)
MCV: 99 fL — ABNORMAL HIGH (ref 79–97)
Monocytes Absolute: 0.7 10*3/uL (ref 0.1–0.9)
Monocytes: 9 %
Neutrophils Absolute: 4.2 10*3/uL (ref 1.4–7.0)
Neutrophils: 61 %
Platelets: 259 10*3/uL (ref 150–450)
RBC: 4.06 x10E6/uL (ref 3.77–5.28)
RDW: 11.7 % — ABNORMAL LOW (ref 12.3–15.4)
WBC: 7 10*3/uL (ref 3.4–10.8)

## 2018-05-04 LAB — COMPREHENSIVE METABOLIC PANEL
ALT: 21 IU/L (ref 0–32)
AST: 16 IU/L (ref 0–40)
Albumin/Globulin Ratio: 1.7 (ref 1.2–2.2)
Albumin: 4.5 g/dL (ref 3.5–5.5)
Alkaline Phosphatase: 72 IU/L (ref 39–117)
BUN/Creatinine Ratio: 16 (ref 9–23)
BUN: 11 mg/dL (ref 6–24)
Bilirubin Total: 0.3 mg/dL (ref 0.0–1.2)
CO2: 26 mmol/L (ref 20–29)
Calcium: 10.2 mg/dL (ref 8.7–10.2)
Chloride: 100 mmol/L (ref 96–106)
Creatinine, Ser: 0.7 mg/dL (ref 0.57–1.00)
GFR calc Af Amer: 118 mL/min/{1.73_m2} (ref >59)
GFR calc non Af Amer: 103 mL/min/{1.73_m2} (ref >59)
Globulin, Total: 2.6 g/dL (ref 1.5–4.5)
Glucose: 116 mg/dL — ABNORMAL HIGH (ref 65–99)
Potassium: 4.3 mmol/L (ref 3.5–5.2)
Sodium: 141 mmol/L (ref 134–144)
Total Protein: 7.1 g/dL (ref 6.0–8.5)

## 2018-05-04 LAB — HEMOGLOBIN A1C
Est. average glucose Bld gHb Est-mCnc: 120 mg/dL
Hgb A1c MFr Bld: 5.8 % — ABNORMAL HIGH (ref 4.8–5.6)

## 2018-05-27 ENCOUNTER — Other Ambulatory Visit: Payer: Self-pay | Admitting: Family Medicine

## 2018-05-28 NOTE — Telephone Encounter (Signed)
Requested Prescriptions  Pending Prescriptions Disp Refills  . lisinopril (PRINIVIL,ZESTRIL) 10 MG tablet [Pharmacy Med Name: LISINOPRIL 10 MG TABLET] 90 tablet 2    Sig: TAKE 1 TABLET BY MOUTH EVERY DAY     Cardiovascular:  ACE Inhibitors Passed - 05/27/2018  9:17 AM      Passed - Cr in normal range and within 180 days    Creatinine, Ser  Date Value Ref Range Status  05/03/2018 0.70 0.57 - 1.00 mg/dL Final         Passed - K in normal range and within 180 days    Potassium  Date Value Ref Range Status  05/03/2018 4.3 3.5 - 5.2 mmol/L Final         Passed - Patient is not pregnant      Passed - Last BP in normal range    BP Readings from Last 1 Encounters:  05/03/18 138/84         Passed - Valid encounter within last 6 months    Recent Outpatient Visits          3 weeks ago Dizziness   [facility] Trey Sailors, PA-C   2 months ago Essential hypertension   [facility] Crissman, Redge Gainer, MD   5 months ago Essential hypertension   [facility] Crissman, Redge Gainer, MD   1 year ago Essential hypertension   [facility] Crissman, Redge Gainer, MD   1 year ago Diabetes mellitus without complication [facility])   [facility] Crissman, Redge Gainer, MD      Future Appointments            In 1 week Crissman, Redge Gainer, MD [facility]

## 2018-05-29 LAB — HM DIABETES EYE EXAM

## 2018-06-06 ENCOUNTER — Encounter: Payer: Self-pay | Admitting: Family Medicine

## 2018-06-06 ENCOUNTER — Ambulatory Visit (INDEPENDENT_AMBULATORY_CARE_PROVIDER_SITE_OTHER): Payer: BC Managed Care – PPO | Admitting: Family Medicine

## 2018-06-06 VITALS — BP 111/75 | HR 87 | Temp 98.6°F | Ht 68.0 in | Wt 177.7 lb

## 2018-06-06 DIAGNOSIS — I10 Essential (primary) hypertension: Secondary | ICD-10-CM | POA: Diagnosis not present

## 2018-06-06 DIAGNOSIS — E119 Type 2 diabetes mellitus without complications: Secondary | ICD-10-CM

## 2018-06-06 DIAGNOSIS — E7800 Pure hypercholesterolemia, unspecified: Secondary | ICD-10-CM

## 2018-06-06 LAB — LP+ALT+AST PICCOLO, WAIVED
ALT (SGPT) Piccolo, Waived: 26 U/L (ref 10–47)
AST (SGOT) PICCOLO, WAIVED: 33 U/L (ref 11–38)
CHOL/HDL RATIO PICCOLO,WAIVE: 2.2 mg/dL
Cholesterol Piccolo, Waived: 135 mg/dL (ref <200)
HDL CHOL PICCOLO, WAIVED: 62 mg/dL (ref >59)
LDL CHOL CALC PICCOLO WAIVED: 57 mg/dL (ref <100)
TRIGLYCERIDES PICCOLO,WAIVED: 78 mg/dL (ref <150)
VLDL Chol Calc Piccolo,Waive: 16 mg/dL (ref <30)

## 2018-06-06 MED ORDER — IBUPROFEN 800 MG PO TABS: 800.0000 mg | ORAL_TABLET | Freq: Three times a day (TID) | ORAL | 5 refills | Status: DC | PRN

## 2018-06-06 MED ORDER — CYCLOBENZAPRINE HCL 5 MG PO TABS: 5.0000 mg | ORAL_TABLET | Freq: Three times a day (TID) | ORAL | 2 refills | Status: DC | PRN

## 2018-06-06 NOTE — Progress Notes (Signed)
   BP 111/75 (BP Location: Left Arm, Patient Position: Sitting, Cuff Size: Normal)   Pulse 87   Temp 98.6 F (37 C) (Oral)   Ht 5\' 8"  (1.727 m)   Wt 177 lb 11.2 oz (80.6 kg)   SpO2 97%   BMI 27.02 kg/m    Subjective:    Patient ID: Holly Morales, female    DOB: [birth year], 47 y.o.   MRN: [id number]  HPI: Holly Morales is a 47 y.o. female  Chief Complaint  Patient presents with  . Diabetes  . Hypertension  . Hypothyroidism  Patient follow-up diabetes doing well no low blood sugar spells has checked her blood sugar during some of these slight dizzy spells and had a 92 otherwise is been okay. Patient has had some numbness in her left arm comes on positional sometimes associated with her slight dizzy spells.  Has an appointment coming up with neurology. Blood pressure thyroid doing well with no complaints or concerns taking medications faithfully without problems.  Relevant past medical, surgical, family and social history reviewed and updated as indicated. Interim medical history since our last visit reviewed. Allergies and medications reviewed and updated.  Review of Systems  Constitutional: Negative.   Respiratory: Negative.   Cardiovascular: Negative.     Per HPI unless specifically indicated above     Objective:    BP 111/75 (BP Location: Left Arm, Patient Position: Sitting, Cuff Size: Normal)   Pulse 87   Temp 98.6 F (37 C) (Oral)   Ht 5\' 8"  (1.727 m)   Wt 177 lb 11.2 oz (80.6 kg)   SpO2 97%   BMI 27.02 kg/m   Wt Readings from Last 3 Encounters:  06/06/18 177 lb 11.2 oz (80.6 kg)  05/03/18 186 lb (84.4 kg)  03/01/18 189 lb 9.6 oz (86 kg)    Physical Exam  Constitutional: She is oriented to person, place, and time. She appears well-developed and well-nourished.  HENT:  Head: Normocephalic and atraumatic.  Eyes: Conjunctivae and EOM are normal.  Neck: Normal range of motion.  Cardiovascular: Normal rate, regular rhythm and normal heart sounds.    Pulmonary/Chest: Effort normal and breath sounds normal.  Musculoskeletal: Normal range of motion.  Neurological: She is alert and oriented to person, place, and time.  Skin: No erythema.  Psychiatric: She has a normal mood and affect. Her behavior is normal. Judgment and thought content normal.    Results for orders placed or performed in visit on 05/31/18  HM DIABETES EYE EXAM  Result Value Ref Range   HM Diabetic Eye Exam No Retinopathy No Retinopathy      Assessment & Plan:   Problem List Items Addressed This Visit      Cardiovascular and Mediastinum   Hypertension - Primary    The current medical regimen is effective;  continue present plan and medications.       Relevant Orders   Basic metabolic panel     Endocrine   Diabetes mellitus without complication (HCC)     Other   Hypercholesteremia    The current medical regimen is effective;  continue present plan and medications.       Relevant Orders   LP+ALT+AST [place], Waived    Dizziness numbness stable but persistent has work-up pending.  Follow up plan: Return in about 6 months (around 12/06/2018) for Hemoglobin A1c, Physical Exam.

## 2018-06-06 NOTE — Assessment & Plan Note (Signed)
The current medical regimen is effective;  continue present plan and medications.  

## 2018-06-07 ENCOUNTER — Encounter: Payer: Self-pay | Admitting: Family Medicine

## 2018-06-07 LAB — BASIC METABOLIC PANEL
BUN/Creatinine Ratio: 17 (ref 9–23)
BUN: 11 mg/dL (ref 6–24)
CO2: 23 mmol/L (ref 20–29)
Calcium: 10 mg/dL (ref 8.7–10.2)
Chloride: 100 mmol/L (ref 96–106)
Creatinine, Ser: 0.64 mg/dL (ref 0.57–1.00)
GFR, EST AFRICAN AMERICAN: 122 mL/min/{1.73_m2} (ref >59)
GFR, EST NON AFRICAN AMERICAN: 106 mL/min/{1.73_m2} (ref >59)
Glucose: 115 mg/dL — ABNORMAL HIGH (ref 65–99)
POTASSIUM: 4.5 mmol/L (ref 3.5–5.2)
SODIUM: 140 mmol/L (ref 134–144)

## 2018-06-25 ENCOUNTER — Encounter: Payer: Self-pay | Admitting: Neurology

## 2018-06-25 ENCOUNTER — Other Ambulatory Visit: Payer: Self-pay | Admitting: Neurology

## 2018-06-25 ENCOUNTER — Ambulatory Visit: Payer: BC Managed Care – PPO | Admitting: Neurology

## 2018-06-25 ENCOUNTER — Ambulatory Visit: Payer: Self-pay | Admitting: Neurology

## 2018-06-25 ENCOUNTER — Telehealth: Payer: Self-pay | Admitting: Neurology

## 2018-06-25 VITALS — BP 122/84 | HR 89 | Ht 69.0 in | Wt 181.0 lb

## 2018-06-25 DIAGNOSIS — R29898 Other symptoms and signs involving the musculoskeletal system: Secondary | ICD-10-CM | POA: Diagnosis not present

## 2018-06-25 DIAGNOSIS — M792 Neuralgia and neuritis, unspecified: Secondary | ICD-10-CM

## 2018-06-25 DIAGNOSIS — R200 Anesthesia of skin: Secondary | ICD-10-CM

## 2018-06-25 DIAGNOSIS — R202 Paresthesia of skin: Secondary | ICD-10-CM

## 2018-06-25 DIAGNOSIS — M5412 Radiculopathy, cervical region: Secondary | ICD-10-CM

## 2018-06-25 MED ORDER — ALPRAZOLAM 0.25 MG PO TABS: ORAL_TABLET | ORAL | 0 refills | Status: DC

## 2018-06-25 MED ORDER — GABAPENTIN 300 MG PO CAPS: 300.0000 mg | ORAL_CAPSULE | Freq: Three times a day (TID) | ORAL | 11 refills | Status: DC

## 2018-06-25 NOTE — Telephone Encounter (Signed)
MR Cervical spine wo contrast Dr. Valaria GoodAhern [employer] Auth: [id number] (exp. 06/25/18 to 07/24/18). [facility].   She also informed me she is slightly claustrophobic and would like something to help her.

## 2018-06-25 NOTE — Progress Notes (Signed)
[facility]    Provider:  Dr Lucia GaskinsAhern Referring Provider: Steele Sizerrissman, Mark A, MD, Maryella ShiversPollak, Adriana M, PA-C Primary Care Physician:  Steele Sizerrissman, Mark A, MD, Trey SailorsPollak, Adriana M, PA-C  CC:  Numbness and dizziness.  HPI:  Holly Morales is a 47 y.o. female here as requested by Dr. Dossie Arbourrissman for dizziness. pmhx dm1, htn, hypothyroid.  [facility], she was told she has bursitis and was treated. She was doing more physical work at that time at her ob, now she is sitting more. She has pain that starts in the left shoulder and her hands will go numb, also some numbness in the right hand, fingers tingling when driving, it wakes her up at night burning and throbbing worse at night. Left > right. 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 Slowly progressive. When she has the pain down her arm it is a 6-7 in pain.Symptoms happen daily.   Reviewed notes, labs and imaging from outside physicians, which showed:  Bmp 06/06/2018 unremarkable  Review of Systems: Patient complains of symptoms per HPI as well as the following symptoms: numbness, tingling. Pertinent negatives and positives per HPI. All others negative.   Social History   Socioeconomic History  . Marital status: Divorced    Spouse name: Not on file  . Number of children: 0  . Years of education: Not on file  . Highest education level: High school graduate  Occupational History  . Not on file  Social Needs  . Financial resource strain: Not on file  . Food insecurity:    Worry: Not on file    Inability: Not on file  . Transportation needs:    Medical: Not on file    Non-medical: Not on file  Tobacco Use  . Smoking status: Current Every Day Smoker    Packs/day: 0.50    Types: Cigarettes  . Smokeless tobacco: Never Used  Substance and Sexual Activity  . Alcohol use: Yes    Alcohol/week: 1.0 standard drinks    Types: 1 Standard drinks or equivalent per week     Comment: on occasion  . Drug use: No    Comment: 06/25/18--30 yrs ago smoked marijuana  . Sexual activity: Not on file  Lifestyle  . Physical activity:    Days per week: Not on file    Minutes per session: Not on file  . Stress: Not on file  Relationships  . Social connections:    Talks on phone: Not on file    Gets together: Not on file    Attends religious service: Not on file    Active member of club or organization: Not on file    Attends meetings of clubs or organizations: Not on file    Relationship status: Not on file  . Intimate partner violence:    Fear of current or ex partner: Not on file    Emotionally abused: Not on file    Physically abused: Not on file    Forced sexual activity: Not on file  Other Topics Concern  . Not on file  Social History Narrative   Lives at home alone   Right handed   Caffeine: 1 pot of coffee every morning, 1 cup of unsweet tea daily, lots of water, very seldom soda.    Family History  Problem Relation Age of Onset  . Diabetes Father   . Cancer Father  Prostate  . Irregular heart beat Father     Past Medical History:  Diagnosis Date  . Depression    06/25/18 no longer an issue per pt   . Diabetes mellitus without complication (HCC)   . High cholesterol   . Obesity (BMI 30-39.9)     Past Surgical History:  Procedure Laterality Date  . LEEP  2009  . NO PAST SURGERIES      Current Outpatient Medications  Medication Sig Dispense Refill  . cyclobenzaprine (FLEXERIL) 5 MG tablet Take 1 tablet (5 mg total) by mouth 3 (three) times daily as needed for muscle spasms. 30 tablet 2  . fluconazole (DIFLUCAN) 150 MG tablet TAKE 1 TABLET (150 MG TOTAL) BY MOUTH EVERY 30 (THIRTY) DAYS. 12 tablet 0  . glucose blood (ONE TOUCH ULTRA TEST) test strip USE TO TEST BLOOD SUGAR DAILY 100 each 12  . ibuprofen (ADVIL,MOTRIN) 800 MG tablet Take 1 tablet (800 mg total) by mouth every 8 (eight) hours as needed. 90 tablet 5  . lisinopril  (PRINIVIL,ZESTRIL) 10 MG tablet TAKE 1 TABLET BY MOUTH EVERY DAY 90 tablet 2  . metFORMIN (GLUCOPHAGE) 500 MG tablet Take 2 tablets (1,000 mg total) by mouth 2 (two) times daily with a meal. 360 tablet 4  . ONETOUCH DELICA LANCETS 33G MISC USE TO TEST BLOOD SUGAR DAILY 100 each 12  . simvastatin (ZOCOR) 40 MG tablet Take 1 tablet (40 mg total) by mouth at bedtime. 90 tablet 2  . sitaGLIPtin (JANUVIA) 100 MG tablet Take 100 mg by mouth daily.    . valACYclovir (VALTREX) 1000 MG tablet TAKE 1 TABLET BY MOUTH TWICE A DAY 60 tablet 2  . gabapentin (NEURONTIN) 300 MG capsule Take 1 capsule (300 mg total) by mouth 3 (three) times daily. 90 capsule 11   No current facility-administered medications for this visit.     Allergies as of 06/25/2018 - Review Complete 06/25/2018  Allergen Reaction Noted  . Jardiance [empagliflozin]  06/21/2016    Vitals: BP 122/84 (BP Location: Left Arm, Patient Position: Sitting)   Pulse 89   Ht 5\' 9"  (1.753 m)   Wt 181 lb (82.1 kg)   LMP 11/06/2017 (Approximate)   BMI 26.73 kg/m  Last Weight:  Wt Readings from Last 1 Encounters:  06/25/18 181 lb (82.1 kg)   Last Height:   Ht Readings from Last 1 Encounters:  06/25/18 5\' 9"  (1.753 m)   Physical exam: Exam: Gen: NAD, conversant, well nourised, obese, well groomed                     CV: RRR, no MRG. No Carotid Bruits. No peripheral edema, warm, nontender Eyes: Conjunctivae clear without exudates or hemorrhage  Neuro: Detailed Neurologic Exam  Speech:    Speech is normal; fluent and spontaneous with normal comprehension.  Cognition:    The patient is oriented to person, place, and time;     recent and remote memory intact;     language fluent;     normal attention, concentration,     fund of knowledge Cranial Nerves:    The pupils are equal, round, and reactive to light. The fundi are normal and spontaneous venous pulsations are present. Visual fields are full to finger confrontation. Extraocular  movements are intact. Trigeminal sensation is intact and the muscles of mastication are normal. The face is symmetric. The palate elevates in the midline. Hearing intact. Voice is normal. Shoulder shrug is normal. The tongue has normal motion  without fasciculations.   Coordination:    Normal finger to nose and heel to shin. Normal rapid alternating movements.   Gait:    Heel-toe and tandem gait are normal.   Motor Observation:    No asymmetry, no atrophy, and no involuntary movements noted. Tone:    Normal muscle tone.    Posture:    Posture is normal. normal erect    Strength: weak grip ;eft > right otherwise strength is V/V in the upper and lower limbs.      Sensation: intact to LT     Reflex Exam:  DTR's:    Deep tendon reflexes in the upper extremities are normal bilaterally.  Trace in the lowers bilat. Toes:    The toes are downgoing bilaterally.   Clonus:    Clonus is absent.   + phalen's maneuver    Assessment/Plan:   47 year old with cervical radiculopathy and numbness in the hands and tingling. She has been treated for >6 months by Dr. Darrelyn Hillock wit conservative management, nsaids and analgesics, stretching and heating. Need MRI cervical spine to evaluate for cervical radiculopathy and myelopathy for surgical or other interventions  Emg/ncs bilateral uppers MRI of the cervical spine  Discussed precautions, no lifting over 5-10 pounds, fall precautions, if worsening or new symptoms go to ED Conservative measures, hand bracing  Orders Placed This Encounter  Procedures  . MR CERVICAL SPINE WO CONTRAST  . NCV with EMG(electromyography)   Meds ordered this encounter  Medications  . gabapentin (NEURONTIN) 300 MG capsule    Sig: Take 1 capsule (300 mg total) by mouth 3 (three) times daily.    Dispense:  90 capsule    Refill:  11    Cc: rissman, Redge Gainer, MD, Trey Sailors, PA-C  Naomie Dean, MD  [facility] [address] [place], [place] [address]  Phone [phone] Fax [phone]

## 2018-06-25 NOTE — Telephone Encounter (Signed)
Sent in Xanax for procedure to her pharmacy, let her know thanks

## 2018-06-25 NOTE — Telephone Encounter (Signed)
I spoke to the patient she is aware.  °

## 2018-06-25 NOTE — Patient Instructions (Addendum)
MRI of the cervical spine Gabapentin for the pain  Gabapentin capsules or tablets What is this medicine? GABAPENTIN (GA ba pen tin) is used to control partial seizures in adults with epilepsy. It is also used to treat certain types of nerve pain. This medicine may be used for other purposes; ask your health care provider or pharmacist if you have questions. COMMON BRAND NAME(S): Active-PAC with Gabapentin, Gabarone, Neurontin What should I tell my health care provider before I take this medicine? They need to know if you have any of these conditions: -kidney disease -suicidal thoughts, plans, or attempt; a previous suicide attempt by you or a family member -an unusual or allergic reaction to gabapentin, other medicines, foods, dyes, or preservatives -pregnant or trying to get pregnant -breast-feeding How should I use this medicine? Take this medicine by mouth with a glass of water. Follow the directions on the prescription label. You can take it with or without food. If it upsets your stomach, take it with food.Take your medicine at regular intervals. Do not take it more often than directed. Do not stop taking except on your doctor's advice. If you are directed to break the 600 or 800 mg tablets in half as part of your dose, the extra half tablet should be used for the next dose. If you have not used the extra half tablet within 28 days, it should be thrown away. A special MedGuide will be given to you by the pharmacist with each prescription and refill. Be sure to read this information carefully each time. Talk to your pediatrician regarding the use of this medicine in children. Special care may be needed. Overdosage: If you think you have taken too much of this medicine contact a poison control center or emergency room at once. NOTE: This medicine is only for you. Do not share this medicine with others. What if I miss a dose? If you miss a dose, take it as soon as you can. If it is almost  time for your next dose, take only that dose. Do not take double or extra doses. What may interact with this medicine? Do not take this medicine with any of the following medications: -other gabapentin products This medicine may also interact with the following medications: -alcohol -antacids -antihistamines for allergy, cough and cold -certain medicines for anxiety or sleep -certain medicines for depression or psychotic disturbances -homatropine; hydrocodone -naproxen -narcotic medicines (opiates) for pain -phenothiazines like chlorpromazine, mesoridazine, prochlorperazine, thioridazine This list may not describe all possible interactions. Give your health care provider a list of all the medicines, herbs, non-prescription drugs, or dietary supplements you use. Also tell them if you smoke, drink alcohol, or use illegal drugs. Some items may interact with your medicine. What should I watch for while using this medicine? Visit your doctor or health care professional for regular checks on your progress. You may want to keep a record at home of how you feel your condition is responding to treatment. You may want to share this information with your doctor or health care professional at each visit. You should contact your doctor or health care professional if your seizures get worse or if you have any new types of seizures. Do not stop taking this medicine or any of your seizure medicines unless instructed by your doctor or health care professional. Stopping your medicine suddenly can increase your seizures or their severity. Wear a medical identification bracelet or chain if you are taking this medicine for seizures, and carry a card  that lists all your medications. You may get drowsy, dizzy, or have blurred vision. Do not drive, use machinery, or do anything that needs mental alertness until you know how this medicine affects you. To reduce dizzy or fainting spells, do not sit or stand up quickly,  especially if you are an older patient. Alcohol can increase drowsiness and dizziness. Avoid alcoholic drinks. Your mouth may get dry. Chewing sugarless gum or sucking hard candy, and drinking plenty of water will help. The use of this medicine may increase the chance of suicidal thoughts or actions. Pay special attention to how you are responding while on this medicine. Any worsening of mood, or thoughts of suicide or dying should be reported to your health care professional right away. Women who become pregnant while using this medicine may enroll in the [place] American Antiepileptic Drug Pregnancy Registry by calling [phone]. This registry collects information about the safety of antiepileptic drug use during pregnancy. What side effects may I notice from receiving this medicine? Side effects that you should report to your doctor or health care professional as soon as possible: -allergic reactions like skin rash, itching or hives, swelling of the face, lips, or tongue -worsening of mood, thoughts or actions of suicide or dying Side effects that usually do not require medical attention (report to your doctor or health care professional if they continue or are bothersome): -constipation -difficulty walking or controlling muscle movements -dizziness -nausea -slurred speech -tiredness -tremors -weight gain This list may not describe all possible side effects. Call your doctor for medical advice about side effects. You may report side effects to FDA at 1-800-FDA-1088. Where should I keep my medicine? Keep out of reach of children. This medicine may cause accidental overdose and death if it taken by other adults, children, or pets. Mix any unused medicine with a substance like cat litter or coffee grounds. Then throw the medicine away in a sealed container like a sealed bag or a coffee can with a lid. Do not use the medicine after the expiration date. Store at room temperature between 15 and  30 degrees C (59 and 86 degrees F). NOTE: This sheet is a summary. It may not cover all possible information. If you have questions about this medicine, talk to your doctor, pharmacist, or health care provider.  2018 Elsevier/Gold Standard (2013-09-20 15:26:50)   Carpal Tunnel Syndrome Carpal tunnel syndrome is a condition that causes pain in your hand and arm. The carpal tunnel is a narrow area that is on the palm side of your wrist. Repeated wrist motion or certain diseases may cause swelling in the tunnel. This swelling can pinch the main nerve in the wrist (median nerve). Follow these instructions at home: If you have a splint:  Wear it as told by your doctor. Remove it only as told by your doctor.  Loosen the splint if your fingers: ? Become numb and tingle. ? Turn blue and cold.  Keep the splint clean and dry. General instructions  Take over-the-counter and prescription medicines only as told by your doctor.  Rest your wrist from any activity that may be causing your pain. If needed, talk to your employer about changes that can be made in your work, such as getting a wrist pad to use while typing.  If directed, apply ice to the painful area: ? Put ice in a plastic bag. ? Place a towel between your skin and the bag. ? Leave the ice on for 20 minutes, 2-3 times per  day.  Keep all follow-up visits as told by your doctor. This is important.  Do any exercises as told by your doctor, physical therapist, or occupational therapist. Contact a doctor if:  You have new symptoms.  Medicine does not help your pain.  Your symptoms get worse. This information is not intended to replace advice given to you by your health care provider. Make sure you discuss any questions you have with your health care provider. Document Released: 07/14/2011 Document Revised: 12/31/2015 Document Reviewed: 12/10/2014 Elsevier Interactive Patient Education  2018 Elsevier  Inc.  Electromyoneurogram Electromyoneurogram is a test to check how well your muscles and nerves are working. This procedure includes the combined use of electromyogram (EMG) and nerve conduction study (NCS). EMG is used to look for muscular disorders. NCS, which is also called electroneurogram, measures how well your nerves are controlling your muscles. The procedures are usually performed together to check if your muscles and nerves are healthy. If the reaction to testing is abnormal, this can indicate disease or injury, such as peripheral nerve damage. Tell a health care provider about:  Any allergies you have.  All medicines you are taking, including vitamins, herbs, eye drops, creams, and over-the-counter medicines.  Any problems you or family members have had with anesthetic medicines.  Any blood disorders you have.  Any surgeries you have had.  Any medical conditions you have.  Any pacemaker you have. What are the risks? Generally, this is a safe procedure. However, problems may occur, including:  Infection where the electrodes were inserted.  Bleeding.  What happens before the procedure?  Ask your health care provider about: ? Changing or stopping your regular medicines. This is especially important if you are taking diabetes medicines or blood thinners. ? Taking medicines such as aspirin and ibuprofen. These medicines can thin your blood. Do not take these medicines before your procedure if your health care provider instructs you not to.  Your health care provider may ask you to avoid: ? Caffeine, such as coffee and tea. ? Nicotine. This includes cigarettes and anything with tobacco.  Do not use lotions or creams on the same day that you will be having the procedure. What happens during the procedure? For EMG:  Your health care provider will ask you to stay in a position so that he or she can access the muscle that will be studied. You may be standing, sitting down,  or lying down.  You may be given a medicine that numbs the area (local anesthetic).  A very thin needle that has an electrode on it will be inserted into your muscle.  Another small electrode will be placed on your skin near the muscle.  Your health care provider will ask you to continue to remain still.  The electrodes will send a signal that tells about the electrical activity of your muscles. You may see this on a monitor or hear it in the room.  After your muscles have been studied at rest, your health care provider will ask you to contract or flex your muscles. The electrodes will send a signal that tells about the electrical activity of your muscles.  Your health care provider will remove the electrodes and the electrode needles when the procedure is finished. The procedure may vary among health care providers and hospitals. For NCS:  An electrode that records your nerve activity (recording electrode) will be placed on your skin by the muscle that is being studied.  An electrode that is used as  a reference (reference electrode) will be placed near the recording electrode.  A paste or gel will be applied to your skin between the recording electrode and the reference electrode.  Your nerve will be stimulated with a mild shock. Your health care provider will measure how much time it takes for your muscle to react.  Your health care provider will remove the electrodes and the gel when the procedure is finished. The procedure may vary among health care providers and hospitals. What happens after the procedure?  It is your responsibility to obtain your test results. Ask your health care provider or the department performing the test when and how you will get your results.  Your health care provider may: ? Give you medicines for any pain. ? Monitor the insertion sites to make sure that they stop bleeding. This information is not intended to replace advice given to you by your health  care provider. Make sure you discuss any questions you have with your health care provider. Document Released: 11/25/2004 Document Revised: 12/31/2015 Document Reviewed: 09/15/2014 Elsevier Interactive Patient Education  2018 [employer].  Cervical Radiculopathy Cervical radiculopathy means that a nerve in the neck is pinched or bruised. This can cause pain or loss of feeling (numbness) that runs from your neck to your arm and fingers. Follow these instructions at home: Managing pain  Take over-the-counter and prescription medicines only as told by your doctor.  If directed, put ice on the injured or painful area. ? Put ice in a plastic bag. ? Place a towel between your skin and the bag. ? Leave the ice on for 20 minutes, 2-3 times per day.  If ice does not help, you can try using heat. Take a warm shower or warm bath, or use a heat pack as told by your doctor.  You may try a gentle neck and shoulder massage. Activity  Rest as needed. Follow instructions from your doctor about any activities to avoid.  Do exercises as told by your doctor or physical therapist. General instructions  If you were given a soft collar, wear it as told by your doctor.  Use a flat pillow when you sleep.  Keep all follow-up visits as told by your doctor. This is important. Contact a doctor if:  Your condition does not improve with treatment. Get help right away if:  Your pain gets worse and is not controlled with medicine.  You lose feeling or feel weak in your hand, arm, face, or leg.  You have a fever.  You have a stiff neck.  You cannot control when you poop or pee (have incontinence).  You have trouble with walking, balance, or talking. This information is not intended to replace advice given to you by your health care provider. Make sure you discuss any questions you have with your health care provider. Document Released: 07/14/2011 Document Revised: 12/31/2015 Document Reviewed:  09/18/2014 Elsevier Interactive Patient Education  [employer].

## 2018-07-04 ENCOUNTER — Ambulatory Visit: Payer: BC Managed Care – PPO

## 2018-07-04 DIAGNOSIS — R200 Anesthesia of skin: Secondary | ICD-10-CM

## 2018-07-04 DIAGNOSIS — M792 Neuralgia and neuritis, unspecified: Secondary | ICD-10-CM

## 2018-07-04 DIAGNOSIS — M5412 Radiculopathy, cervical region: Secondary | ICD-10-CM | POA: Diagnosis not present

## 2018-07-04 DIAGNOSIS — R29898 Other symptoms and signs involving the musculoskeletal system: Secondary | ICD-10-CM

## 2018-07-04 DIAGNOSIS — R202 Paresthesia of skin: Secondary | ICD-10-CM

## 2018-07-11 ENCOUNTER — Telehealth: Payer: Self-pay | Admitting: *Deleted

## 2018-07-11 NOTE — Telephone Encounter (Signed)
Spoke with patient and informed her that her MRI cervical spine was unremarkable. There are some mild arthritic changes but nothing that can cause her symptoms. Dr. Lucia GaskinsAhern will further exam her at the EMG/NCS and will review at that time. Pt's questions answered. She verbalized understanding and appreciation.

## 2018-07-11 NOTE — Telephone Encounter (Signed)
-----   Message from Anson FretAntonia B Ahern, MD sent at 07/08/2018  9:07 PM EST ----- MRI of the cervical spine is unremarkable, some mild arthritic changes but nothing that can cause her arm symptoms.  We will further examine at EMG/NCS and will review at that time thanks

## 2018-07-17 ENCOUNTER — Other Ambulatory Visit: Payer: Self-pay | Admitting: Neurology

## 2018-07-26 ENCOUNTER — Ambulatory Visit (INDEPENDENT_AMBULATORY_CARE_PROVIDER_SITE_OTHER): Payer: BC Managed Care – PPO | Admitting: Neurology

## 2018-07-26 ENCOUNTER — Ambulatory Visit: Payer: BC Managed Care – PPO | Admitting: Neurology

## 2018-07-26 DIAGNOSIS — R202 Paresthesia of skin: Secondary | ICD-10-CM

## 2018-07-26 DIAGNOSIS — G5603 Carpal tunnel syndrome, bilateral upper limbs: Secondary | ICD-10-CM

## 2018-07-26 DIAGNOSIS — Z0289 Encounter for other administrative examinations: Secondary | ICD-10-CM

## 2018-07-26 DIAGNOSIS — R200 Anesthesia of skin: Secondary | ICD-10-CM

## 2018-07-26 NOTE — Progress Notes (Signed)
History: She has pain that starts in the left shoulder and her hands will go numb, also some numbness in the right hand, fingers tingling when driving, it wakes her up at night burning and throbbing worse at night. Left > right. 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 Slowly progressive.   IMPRESSION: This MRI of the cervical spine without contrast shows the following: reviewed images with patient. 1.    At C6-C7, there is a right paramedian disc protrusion causing minimal right foraminal narrowing but no nerve root compression or spinal stenosis. 2.    At C7-T1, there is a left lateral disc protrusion causing mild left foraminal narrowing but no nerve root compression or spinal stenosis. 3.     There is minimal disc bulging at several levels in the upper cervical spine that does not lead to nerve root compression or spinal stenosis.  EMG/NCS c/w left>right moderately severe CTS  Orders Placed This Encounter  Procedures  . Ambulatory referral to Orthopedic Surgery   A total of 15 minutes was spent face-to-face with this patient. Over half this time was spent on counseling patient on the  1. Bilateral carpal tunnel syndrome   2. Numbness and tingling in both hands     diagnosis and different diagnostic and therapeutic options, counseling and coordination of care, risks ans benefits of management, compliance, or risk factor reduction and education.  This does not include time spent on EMG/NCS

## 2018-08-01 NOTE — Progress Notes (Signed)
See procedure note.

## 2018-08-01 NOTE — Progress Notes (Signed)
Full Name: Holly Morales Gender: Female MRN #: [id number] Date of Birth: 08-27-1970    Visit Date: 07/26/2018 08:59 Age: 47 Years 0 Months Old Examining Physician: Holly DeanAntonia Ahern, MD     History: She has pain that starts in the left shoulder and her hands will go numb, also some numbness in the right hand, fingers tingling when driving, it wakes her up at night burning and throbbing worse at night. Left > right. 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 Slowly progressive.   Summary:   Nerve Conduction Studies were performed on the bilateral upper extremities.  The right median APB motor nerve showed prolonged distal onset latency (5.8 ms, N<4.4) The left  median APB motor nerve showed prolonged distal onset latency (6.8 ms, N<4.4)  The right Median 2nd Digit orthodromic sensory nerve showed prolonged distal peak latency (4.7 ms, N<3.4)  The left  Median 2nd Digit orthodromic sensory nerve showed no response  The left median/ulnar (palm) comparison nerve showed prolonged distal peak latency (Median Palm, 3.6 ms, N<2.2) and abnormal peak latency difference (Median Palm-Ulnar Palm, 1.7 ms, N<0.4) with a relative median delay.    The right median/ulnar (palm) comparison nerve showed prolonged distal peak latency (Median Palm, 2.7 ms, N<2.2) and abnormal peak latency difference (Median Palm-Ulnar Palm, .7 ms, N<0.4) with a relative median delay.     Conclusion: This is an abnormal study. There is electrophysiologic evidence of bilateral moderately-severe left > right Carpal Tunnel Syndrome.  No suggestion of polyneuropathy or radiculopathy.   Holly Morales M.D.  [facility] [address] [place], [place] [address] Tel: [phone] Fax: [phone]        [facility]    Nerve / Sites Muscle Latency Ref. Amplitude Ref. Rel Amp Segments Distance Velocity Ref. Area    ms ms mV mV %  cm m/s m/s mVms  L Median - APB     Wrist APB 6.8 ?4.4 7.8 ?4.0 100 Wrist - APB  7   30.4     Upper arm APB 11.0  7.6  98.5 Upper arm - Wrist 22 52 ?49 30.8  R Median - APB     Wrist APB 5.8 ?4.4 5.8 ?4.0 100 Wrist - APB 7   21.7     Upper arm APB 10.1  5.6  95.8 Upper arm - Wrist 22 52 ?49 21.4  L Ulnar - ADM     Wrist ADM 2.7 ?3.3 12.9 ?6.0 100 Wrist - ADM 7   37.1     B.Elbow ADM 5.5  11.5  89.4 B.Elbow - Wrist 17 60 ?49 35.8     A.Elbow ADM 7.2  11.9  103 A.Elbow - B.Elbow 10 58 ?49 37.9         A.Elbow - Wrist      R Ulnar - ADM     Wrist ADM 2.4 ?3.3 14.6 ?6.0 100 Wrist - ADM 7   36.8     B.Elbow ADM 5.5  12.4  84.8 B.Elbow - Wrist 17 56 ?49 32.6     A.Elbow ADM 7.4  13.5  109 A.Elbow - B.Elbow 10 52 ?49 37.7         A.Elbow - Wrist                 SNC    Nerve / Sites Rec. Site Peak Lat Ref.  Amp Ref. Segments Distance Peak Diff Ref.    ms ms V V  cm ms ms  R Median, Ulnar - Transcarpal comparison     Median Palm Wrist 2.7 ?2.2 4 ?35 Median Palm - Wrist 8       Ulnar Palm Wrist 2.0 ?2.2 18 ?12 Ulnar Palm - Wrist 8          Median Palm - Ulnar Palm  0.7 ?0.4  L Median, Ulnar - Transcarpal comparison     Median Palm Wrist 3.6 ?2.2 17 ?35 Median Palm - Wrist 8       Ulnar Palm Wrist 1.9 ?2.2 24 ?12 Ulnar Palm - Wrist 8          Median Palm - Ulnar Palm  1.7 ?0.4  R Median - Orthodromic (Dig II, Mid palm)     Dig II Wrist 4.7 ?3.4 10 ?10 Dig II - Wrist 13    L Median - Orthodromic (Dig II, Mid palm)     Dig II Wrist NR ?3.4 NR ?10 Dig II - Wrist 13    L Ulnar - Orthodromic, (Dig V, Mid palm)     Dig V Wrist 2.7 ?3.1 10 ?5 Dig V - Wrist 11    R Ulnar - Orthodromic, (Dig V, Mid palm)     Dig V Wrist 2.7 ?3.1 10 ?5 Dig V - Wrist 11                   F  Wave    Nerve F Lat Ref.   ms ms  L Ulnar - ADM 25.9 ?32.0  R Ulnar - ADM 26.8 ?32.0         EMG full       EMG Summary Table    Spontaneous MUAP Recruitment  Muscle IA Fib PSW Fasc Other Amp Dur. Poly Pattern  L. Deltoid Normal None None None _______ Normal Normal Normal Normal  L. Cervical  paraspinals (low) Normal None None None _______ Normal Normal Normal Normal  L. Triceps brachii Normal None None None _______ Normal Normal Normal Normal  L. Pronator teres Normal None None None _______ Normal Normal Normal Normal  L. Opponens pollicis Normal None None None _______ Normal Normal Normal Normal      

## 2018-08-01 NOTE — Procedures (Signed)
Full Name: Holly Morales Gender: Female MRN #: [id number] Date of Birth: 08-27-1970    Visit Date: 07/26/2018 08:59 Age: 47 Years 0 Months Old Examining Physician: Naomie DeanAntonia Jarae Panas, MD     History: She has pain that starts in the left shoulder and her hands will go numb, also some numbness in the right hand, fingers tingling when driving, it wakes her up at night burning and throbbing worse at night. Left > right. 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 Slowly progressive.   Summary:   Nerve Conduction Studies were performed on the bilateral upper extremities.  The right median APB motor nerve showed prolonged distal onset latency (5.8 ms, N<4.4) The left  median APB motor nerve showed prolonged distal onset latency (6.8 ms, N<4.4)  The right Median 2nd Digit orthodromic sensory nerve showed prolonged distal peak latency (4.7 ms, N<3.4)  The left  Median 2nd Digit orthodromic sensory nerve showed no response  The left median/ulnar (palm) comparison nerve showed prolonged distal peak latency (Median Palm, 3.6 ms, N<2.2) and abnormal peak latency difference (Median Palm-Ulnar Palm, 1.7 ms, N<0.4) with a relative median delay.    The right median/ulnar (palm) comparison nerve showed prolonged distal peak latency (Median Palm, 2.7 ms, N<2.2) and abnormal peak latency difference (Median Palm-Ulnar Palm, .7 ms, N<0.4) with a relative median delay.     Conclusion: This is an abnormal study. There is electrophysiologic evidence of bilateral moderately-severe left > right Carpal Tunnel Syndrome.  No suggestion of polyneuropathy or radiculopathy.   Artemio Alyoni Long Brimage M.D.  [facility] [address] [place], [place] [address] Tel: [phone] Fax: [phone]        [facility]    Nerve / Sites Muscle Latency Ref. Amplitude Ref. Rel Amp Segments Distance Velocity Ref. Area    ms ms mV mV %  cm m/s m/s mVms  L Median - APB     Wrist APB 6.8 ?4.4 7.8 ?4.0 100 Wrist - APB  7   30.4     Upper arm APB 11.0  7.6  98.5 Upper arm - Wrist 22 52 ?49 30.8  R Median - APB     Wrist APB 5.8 ?4.4 5.8 ?4.0 100 Wrist - APB 7   21.7     Upper arm APB 10.1  5.6  95.8 Upper arm - Wrist 22 52 ?49 21.4  L Ulnar - ADM     Wrist ADM 2.7 ?3.3 12.9 ?6.0 100 Wrist - ADM 7   37.1     B.Elbow ADM 5.5  11.5  89.4 B.Elbow - Wrist 17 60 ?49 35.8     A.Elbow ADM 7.2  11.9  103 A.Elbow - B.Elbow 10 58 ?49 37.9         A.Elbow - Wrist      R Ulnar - ADM     Wrist ADM 2.4 ?3.3 14.6 ?6.0 100 Wrist - ADM 7   36.8     B.Elbow ADM 5.5  12.4  84.8 B.Elbow - Wrist 17 56 ?49 32.6     A.Elbow ADM 7.4  13.5  109 A.Elbow - B.Elbow 10 52 ?49 37.7         A.Elbow - Wrist                 SNC    Nerve / Sites Rec. Site Peak Lat Ref.  Amp Ref. Segments Distance Peak Diff Ref.    ms ms V V  cm ms ms  R Median, Ulnar - Transcarpal comparison     Median Palm Wrist 2.7 ?2.2 4 ?35 Median Palm - Wrist 8       Ulnar Palm Wrist 2.0 ?2.2 18 ?12 Ulnar Palm - Wrist 8          Median Palm - Ulnar Palm  0.7 ?0.4  L Median, Ulnar - Transcarpal comparison     Median Palm Wrist 3.6 ?2.2 17 ?35 Median Palm - Wrist 8       Ulnar Palm Wrist 1.9 ?2.2 24 ?12 Ulnar Palm - Wrist 8          Median Palm - Ulnar Palm  1.7 ?0.4  R Median - Orthodromic (Dig II, Mid palm)     Dig II Wrist 4.7 ?3.4 10 ?10 Dig II - Wrist 13    L Median - Orthodromic (Dig II, Mid palm)     Dig II Wrist NR ?3.4 NR ?10 Dig II - Wrist 13    L Ulnar - Orthodromic, (Dig V, Mid palm)     Dig V Wrist 2.7 ?3.1 10 ?5 Dig V - Wrist 11    R Ulnar - Orthodromic, (Dig V, Mid palm)     Dig V Wrist 2.7 ?3.1 10 ?5 Dig V - Wrist 1311                   F  Wave    Nerve F Lat Ref.   ms ms  L Ulnar - ADM 25.9 ?32.0  R Ulnar - ADM 26.8 ?32.0         EMG full       EMG Summary Table    Spontaneous MUAP Recruitment  Muscle IA Fib PSW Fasc Other Amp Dur. Poly Pattern  L. Deltoid Normal None None None _______ Normal Normal Normal Normal  L. Cervical  paraspinals (low) Normal None None None _______ Normal Normal Normal Normal  L. Triceps brachii Normal None None None _______ Normal Normal Normal Normal  L. Pronator teres Normal None None None _______ Normal Normal Normal Normal  L. Opponens pollicis Normal None None None _______ Normal Normal Normal Normal

## 2018-09-11 ENCOUNTER — Encounter: Payer: Self-pay | Admitting: Family Medicine

## 2018-09-11 ENCOUNTER — Ambulatory Visit: Payer: BC Managed Care – PPO | Admitting: Family Medicine

## 2018-09-11 ENCOUNTER — Telehealth: Payer: Self-pay

## 2018-09-11 VITALS — BP 126/79 | HR 82 | Temp 98.2°F | Wt 183.0 lb

## 2018-09-11 DIAGNOSIS — E7800 Pure hypercholesterolemia, unspecified: Secondary | ICD-10-CM | POA: Diagnosis not present

## 2018-09-11 DIAGNOSIS — I10 Essential (primary) hypertension: Secondary | ICD-10-CM

## 2018-09-11 DIAGNOSIS — E119 Type 2 diabetes mellitus without complications: Secondary | ICD-10-CM | POA: Diagnosis not present

## 2018-09-11 DIAGNOSIS — E669 Obesity, unspecified: Secondary | ICD-10-CM | POA: Insufficient documentation

## 2018-09-11 DIAGNOSIS — Z6827 Body mass index (BMI) 27.0-27.9, adult: Secondary | ICD-10-CM | POA: Diagnosis not present

## 2018-09-11 LAB — BAYER DCA HB A1C WAIVED: HB A1C (BAYER DCA - WAIVED): 5.7 % (ref <7.0)

## 2018-09-11 MED ORDER — LIRAGLUTIDE -WEIGHT MANAGEMENT 18 MG/3ML ~~LOC~~ SOPN: PEN_INJECTOR | SUBCUTANEOUS | 6 refills | Status: DC

## 2018-09-11 MED ORDER — CYCLOBENZAPRINE HCL 5 MG PO TABS: 5.0000 mg | ORAL_TABLET | Freq: Three times a day (TID) | ORAL | 6 refills | Status: DC | PRN

## 2018-09-11 NOTE — Assessment & Plan Note (Signed)
The current medical regimen is effective;  continue present plan and medications.  

## 2018-09-11 NOTE — Telephone Encounter (Addendum)
PA initiated via Covermymeds. Key: [id number]                    PA has been approved.

## 2018-09-11 NOTE — Progress Notes (Signed)
BP 126/79   Pulse 82   Temp 98.2 F (36.8 C) (Oral)   Wt 183 lb (83 kg)   SpO2 95%   BMI 27.02 kg/m    Subjective:    Patient ID: Holly Morales, female    DOB: 03-06-1971, 48 y.o.   MRN: [id number]  HPI: Holly Morales is a 48 y.o. female  Chief Complaint  Patient presents with  . Diabetes  Patient follow-up diabetes doing well no complaints good blood sugar control.  No low blood sugar spells. Blood pressure doing well without complaints. Taking cholesterol medicine also without problems. Gabapentin for carpal tunnel of her left hand is doing okay wearing a splint may have surgery later this spring.   Relevant past medical, surgical, family and social history reviewed and updated as indicated. Interim medical history since our last visit reviewed. Allergies and medications reviewed and updated.  Review of Systems  Constitutional: Negative.   Respiratory: Negative.   Cardiovascular: Negative.     Per HPI unless specifically indicated above     Objective:    BP 126/79   Pulse 82   Temp 98.2 F (36.8 C) (Oral)   Wt 183 lb (83 kg)   SpO2 95%   BMI 27.02 kg/m   Wt Readings from Last 3 Encounters:  09/11/18 183 lb (83 kg)  06/25/18 181 lb (82.1 kg)  06/06/18 177 lb 11.2 oz (80.6 kg)    Physical Exam Constitutional:      Appearance: She is well-developed.  HENT:     Head: Normocephalic and atraumatic.  Eyes:     Conjunctiva/sclera: Conjunctivae normal.  Neck:     Musculoskeletal: Normal range of motion.  Cardiovascular:     Rate and Rhythm: Normal rate and regular rhythm.     Heart sounds: Normal heart sounds.  Pulmonary:     Effort: Pulmonary effort is normal.     Breath sounds: Normal breath sounds.  Musculoskeletal: Normal range of motion.  Skin:    Findings: No erythema.  Neurological:     Mental Status: She is alert and oriented to person, place, and time.  Psychiatric:        Behavior: Behavior normal.        Thought Content: Thought content  normal.        Judgment: Judgment normal.     Results for orders placed or performed in visit on 06/06/18  Basic metabolic panel  Result Value Ref Range   Glucose 115 (H) 65 - 99 mg/dL   BUN 11 6 - 24 mg/dL   Creatinine, Ser 5.03 0.57 - 1.00 mg/dL   GFR calc non Af Amer 106 >59 mL/min/1.73   GFR calc Af Amer 122 >59 mL/min/1.73   BUN/Creatinine Ratio 17 9 - 23   Sodium 140 134 - 144 mmol/L   Potassium 4.5 3.5 - 5.2 mmol/L   Chloride 100 96 - 106 mmol/L   CO2 23 20 - 29 mmol/L   Calcium 10.0 8.7 - 10.2 mg/dL  LP+ALT+AST Piccolo, Waived  Result Value Ref Range   ALT (SGPT) Piccolo, Waived 26 10 - 47 U/L   AST (SGOT) Piccolo, Waived 33 11 - 38 U/L   Cholesterol Piccolo, Waived 135 <200 mg/dL   HDL Chol Piccolo, Waived 62 >59 mg/dL   Triglycerides Piccolo,Waived 78 <150 mg/dL   Chol/HDL Ratio Piccolo,Waive 2.2 mg/dL   LDL Chol Calc Piccolo Waived 57 <100 mg/dL   VLDL Chol Calc Piccolo,Waive 16 <30 mg/dL  Assessment & Plan:   Problem List Items Addressed This Visit      Cardiovascular and Mediastinum   Hypertension    The current medical regimen is effective;  continue present plan and medications.         Endocrine   Diabetes mellitus without complication (HCC) - Primary    The current medical regimen is effective;  continue present plan and medications.       Relevant Orders   Bayer DCA Hb A1c Waived     Other   Hypercholesteremia    The current medical regimen is effective;  continue present plan and medications.       BMI 27.0-27.9,adult    Pt wants to try wt loss meds gave rx discussed diet nutrition wt loss          Follow up plan: Return in about 6 months (around 03/12/2019) for Hemoglobin A1c, Physical Exam.

## 2018-09-11 NOTE — Assessment & Plan Note (Signed)
Pt wants to try wt loss meds gave rx discussed diet nutrition wt loss

## 2018-09-17 ENCOUNTER — Other Ambulatory Visit: Payer: Self-pay

## 2018-09-17 ENCOUNTER — Telehealth: Payer: Self-pay

## 2018-09-17 ENCOUNTER — Telehealth: Payer: Self-pay | Admitting: Family Medicine

## 2018-09-17 MED ORDER — LIRAGLUTIDE -WEIGHT MANAGEMENT 18 MG/3ML ~~LOC~~ SOPN: PEN_INJECTOR | SUBCUTANEOUS | 12 refills | Status: AC

## 2018-09-17 MED ORDER — LIRAGLUTIDE -WEIGHT MANAGEMENT 18 MG/3ML ~~LOC~~ SOPN: PEN_INJECTOR | SUBCUTANEOUS | 3 refills | Status: DC

## 2018-09-17 MED ORDER — PEN NEEDLES 31G X 8 MM MISC: 1.0000 | Freq: Every day | 11 refills | Status: DC

## 2018-09-17 NOTE — Telephone Encounter (Signed)
Tobi Bastos with [facility] calling.  They still need clarification on this script.  Please call her at [phone]

## 2018-09-17 NOTE — Telephone Encounter (Signed)
Copied from CRM [id number]. Topic: Quick Communication - See Telephone Encounter >> Sep 17, 2018  8:13 AM Aretta Nip wrote: CRM for notification. See Telephone encounter for: 09/17/18.

## 2018-09-17 NOTE — Telephone Encounter (Signed)
Please call pharmacy as the directions are still coming wrong to the pharmacy

## 2018-09-17 NOTE — Telephone Encounter (Signed)
Copied from CRM [id number]. Topic: Quick Communication - See Telephone Encounter >> Sep 17, 2018  8:15 AM Aretta Niprawford, Mary M wrote: CRM for notification. See Telephone encounter for: 09/17/18.pharmacy (Other) [name], Holly Morales (Patient) Quick Communication - See Telephone Encounter Summary: resend incorrect script CRM for notification. See Telephone encounter for: 09/17/18.Liraglutide -Weight Management (SAXENDA) 18 MG/3ML SOPN 4 pen 6 09/11/2018 09/25/2018  Sig - Route: Inject 0.8 mg into the skin once a week for 7 days, THEN 1.6 mg once a week for 7 days. - Subcutaneous  Sent to pharmacy as: Liraglutide -Weight Management (SAXENDA) 18 MG/3ML Solution Pen-injector PHARMACY SAYS THAT SCRIPT IS WRONG... PEN COULD BE ONLY .06 MG AND SCRIPT SAYS ONCE A WEEK/SHOULD BE ONCE A WEEK. PLEASE RESEND SCRIPT  Contact [facility] [place]  [phone] with any questions

## 2018-09-17 NOTE — Telephone Encounter (Signed)
Tried to call pharmacy twice, got a busy signal both times. Will try to call again later.

## 2018-09-17 NOTE — Telephone Encounter (Signed)
Pharmacy sent a fax regarding directions on Saxenda RX being incomplete. Current RX stops after 14 days.   Pharmacy thinks the directions should be, "0.6 mg once daily for 7 days, then 1.2 mg once daily for 7 days, then 1.8 mg once daily for 7 days, then 2.4 mg once daily for 7 days, then 3 mg once daily thereafter."   Please correct directions and resend to the pharmacy. Also needs RX for pen needles as well.

## 2018-09-17 NOTE — Telephone Encounter (Signed)
Copied from CRM [id number]. Topic: Quick Communication - See Telephone Encounter >> Sep 17, 2018  8:06 AM Aretta Nip wrote: CRM for notification. See Telephone encounter for: 09/17/18. pharmacy (Other) Holly Morales (Patient) Quick Communication - See Telephone Encounter  Summary: resend incorrect script  CRM for notification. See Telephone encounter for: 09/17/18.Liraglutide -Weight Management (SAXENDA) 18 MG/3ML SOPN 4 pen 6 09/11/2018 09/25/2018   Sig - Route: Inject 0.8 mg into the skin once a week for 7 days, THEN 1.6 mg once a week for 7 days. - Subcutaneous   Sent to pharmacy as: Liraglutide -Weight Management (SAXENDA) 18 MG/3ML Solution Pen-injector PHARMACY SAYS THAT SCRIPT IS WRONG... PEN COULD BE ONLY .06 MG AND SCRIPT SAYS ONCE A WEEK/SHOULD BE ONCE A WEEK. PLEASE RESEND SCRIPT  Contact [facility] [place]  [phone] with any questions

## 2018-09-18 NOTE — Telephone Encounter (Signed)
Called pharmacy and clarified directions on the medication.

## 2018-09-20 IMAGING — MG MM DIGITAL SCREENING BILAT W/ CAD
5 series · 5 of 5 positions shown · non-contrast
Comparison: Previous exam(s).

ACR Breast Density Category a: The breast tissue is almost entirely
fatty.

CLINICAL DATA: Screening.

EXAM:
DIGITAL SCREENING BILATERAL MAMMOGRAM WITH CAD

[L MLO]
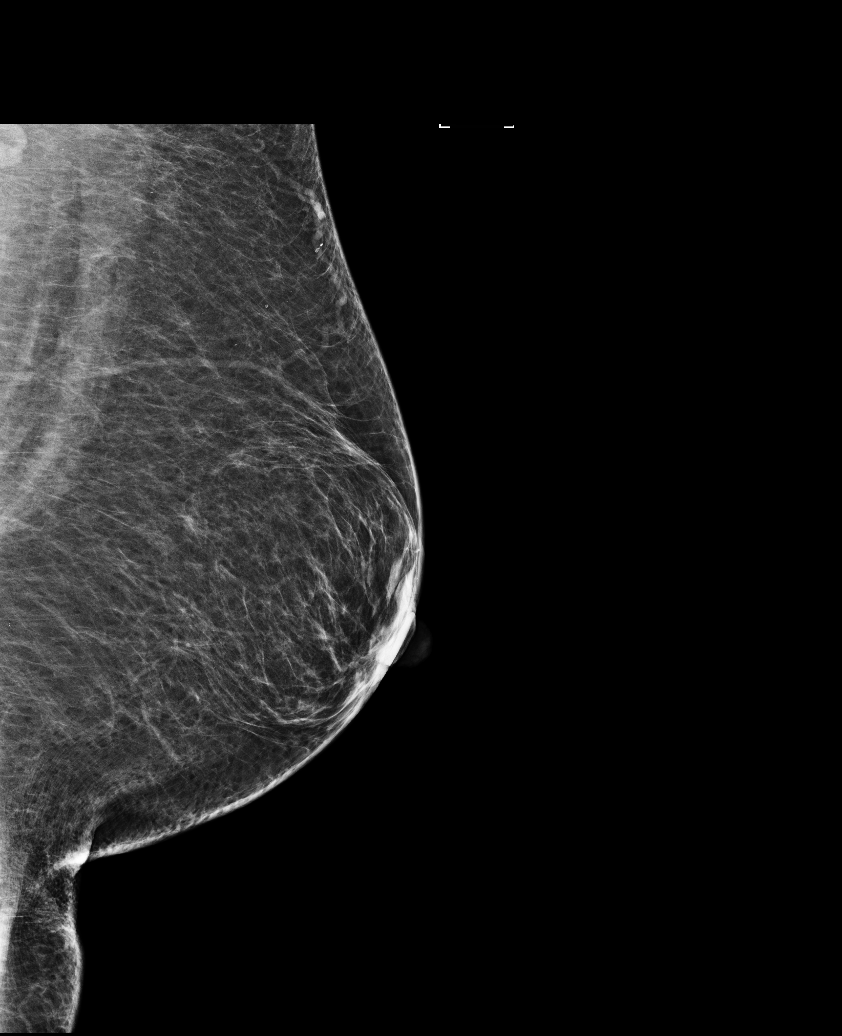

[R CC (1 of 2)]
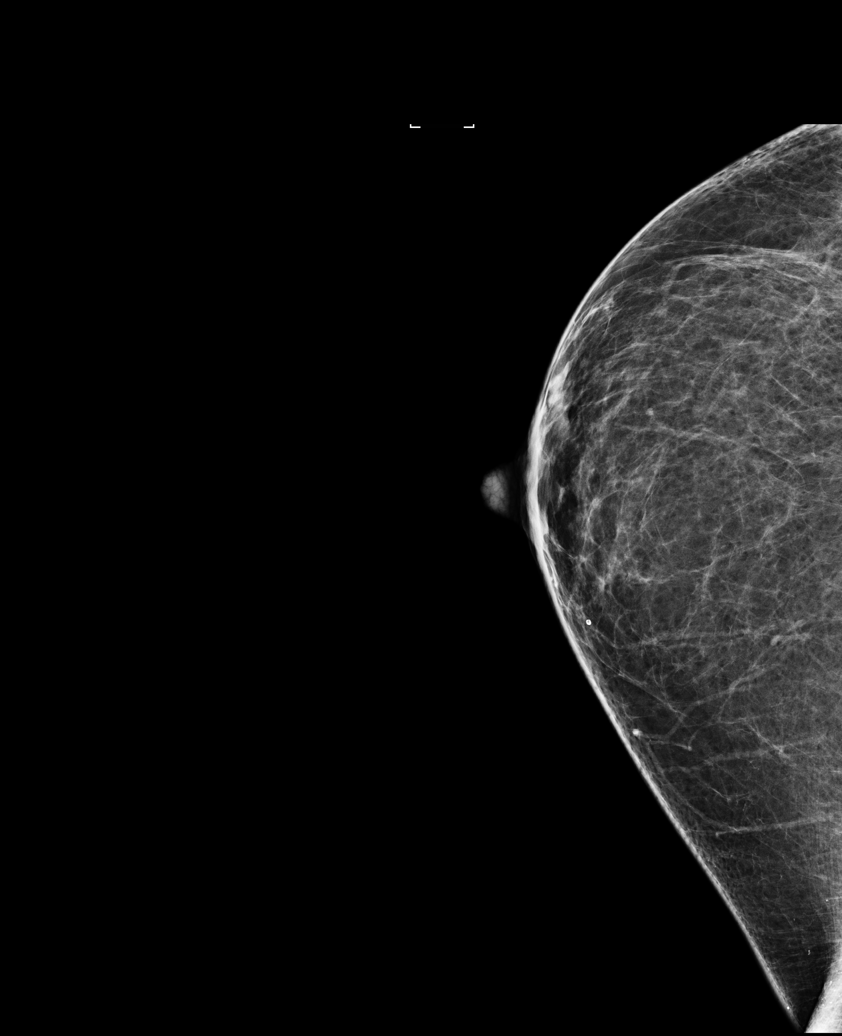

[R CC (2 of 2)]
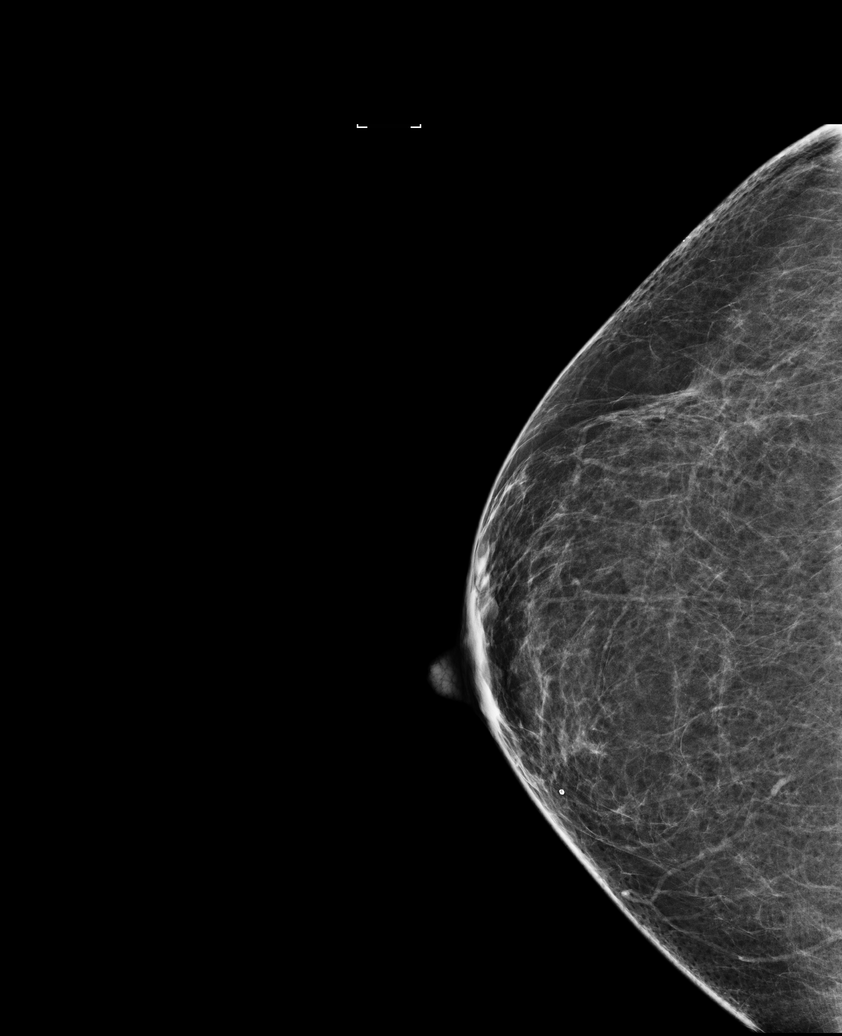

[L CC]
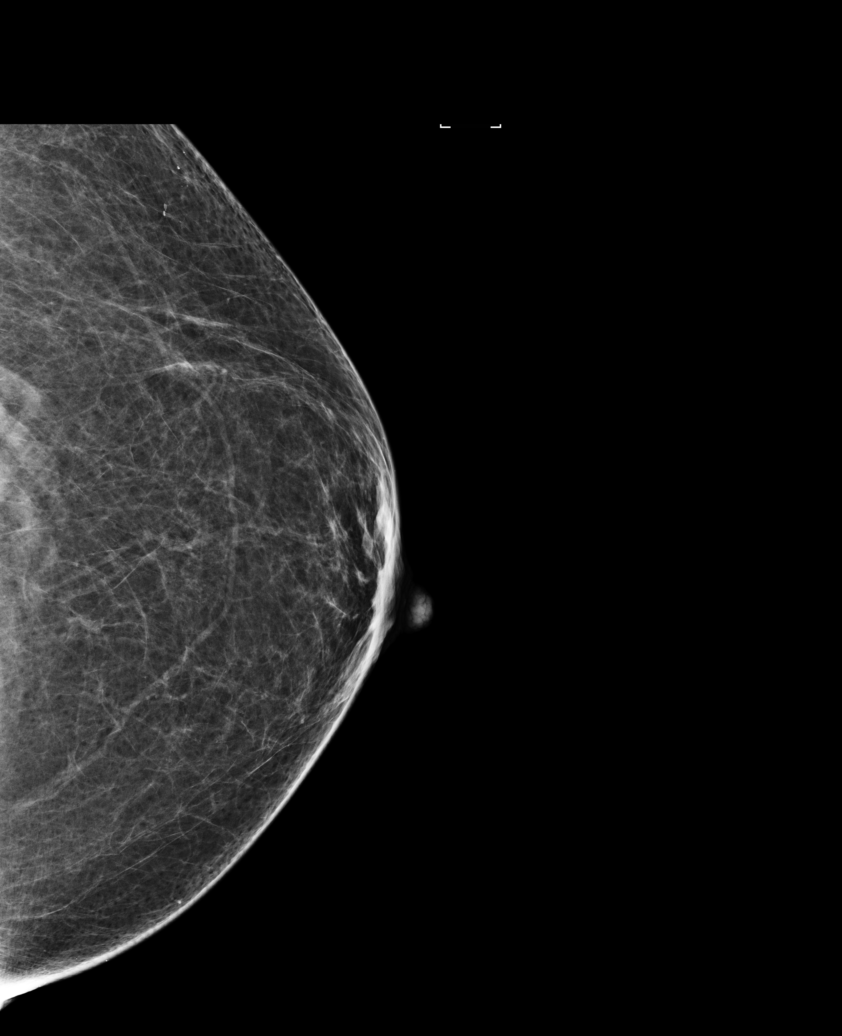

[R MLO]
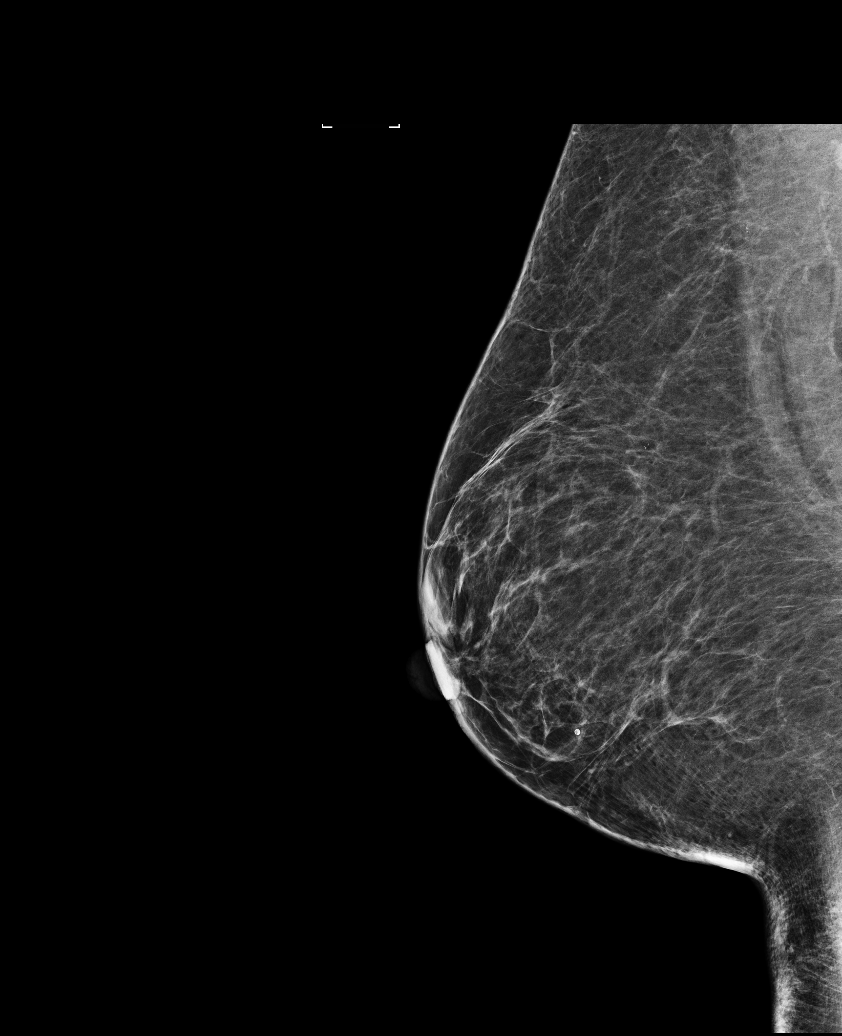

[5 of 5 positions shown; findings below may reference images not displayed]

FINDINGS: There are no findings suspicious for malignancy. Images were
processed with CAD.
IMPRESSION: No mammographic evidence of malignancy. A result letter of this
screening mammogram will be mailed directly to the patient.

RECOMMENDATION:
Screening mammogram in one year. (Code:[id number])

BI-RADS CATEGORY  1: Negative.

## 2018-10-02 ENCOUNTER — Other Ambulatory Visit: Payer: Self-pay | Admitting: Family Medicine

## 2018-10-02 DIAGNOSIS — Z1231 Encounter for screening mammogram for malignant neoplasm of breast: Secondary | ICD-10-CM

## 2018-12-27 ENCOUNTER — Other Ambulatory Visit: Payer: Self-pay | Admitting: Family Medicine

## 2019-01-02 ENCOUNTER — Ambulatory Visit (INDEPENDENT_AMBULATORY_CARE_PROVIDER_SITE_OTHER): Payer: BC Managed Care – PPO | Admitting: Family Medicine

## 2019-01-02 ENCOUNTER — Encounter: Payer: BC Managed Care – PPO | Admitting: Family Medicine

## 2019-01-02 ENCOUNTER — Ambulatory Visit
Admission: RE | Admit: 2019-01-02 | Discharge: 2019-01-02 | Disposition: A | Discharge status: Home / Self Care | Payer: BC Managed Care – PPO | Source: Ambulatory Visit | Attending: Family Medicine | Admitting: Family Medicine

## 2019-01-02 ENCOUNTER — Other Ambulatory Visit: Payer: Self-pay

## 2019-01-02 ENCOUNTER — Other Ambulatory Visit: Payer: Self-pay | Admitting: Family Medicine

## 2019-01-02 ENCOUNTER — Encounter: Payer: Self-pay | Admitting: Family Medicine

## 2019-01-02 ENCOUNTER — Other Ambulatory Visit: Payer: BC Managed Care – PPO

## 2019-01-02 VITALS — Wt 160.0 lb

## 2019-01-02 DIAGNOSIS — I10 Essential (primary) hypertension: Secondary | ICD-10-CM

## 2019-01-02 DIAGNOSIS — Z0000 Encounter for general adult medical examination without abnormal findings: Secondary | ICD-10-CM

## 2019-01-02 DIAGNOSIS — E1169 Type 2 diabetes mellitus with other specified complication: Secondary | ICD-10-CM | POA: Diagnosis not present

## 2019-01-02 DIAGNOSIS — Z1231 Encounter for screening mammogram for malignant neoplasm of breast: Secondary | ICD-10-CM | POA: Diagnosis not present

## 2019-01-02 DIAGNOSIS — E669 Obesity, unspecified: Secondary | ICD-10-CM

## 2019-01-02 DIAGNOSIS — R928 Other abnormal and inconclusive findings on diagnostic imaging of breast: Secondary | ICD-10-CM

## 2019-01-02 DIAGNOSIS — N6310 Unspecified lump in the right breast, unspecified quadrant: Secondary | ICD-10-CM

## 2019-01-02 DIAGNOSIS — E119 Type 2 diabetes mellitus without complications: Secondary | ICD-10-CM

## 2019-01-02 DIAGNOSIS — E7800 Pure hypercholesterolemia, unspecified: Secondary | ICD-10-CM

## 2019-01-02 DIAGNOSIS — E1159 Type 2 diabetes mellitus with other circulatory complications: Secondary | ICD-10-CM

## 2019-01-02 LAB — URINALYSIS, ROUTINE W REFLEX MICROSCOPIC
Bilirubin, UA: NEGATIVE
Glucose, UA: NEGATIVE
Ketones, UA: NEGATIVE
Leukocytes,UA: NEGATIVE
Nitrite, UA: NEGATIVE
Protein,UA: NEGATIVE
RBC, UA: NEGATIVE
Specific Gravity, UA: 1.01 (ref 1.005–1.030)
Urobilinogen, Ur: 0.2 mg/dL (ref 0.2–1.0)
pH, UA: 7 (ref 5.0–7.5)

## 2019-01-02 LAB — BAYER DCA HB A1C WAIVED: HB A1C (BAYER DCA - WAIVED): 5.4 % (ref <7.0)

## 2019-01-02 MED ORDER — LISINOPRIL 10 MG PO TABS: 10.0000 mg | ORAL_TABLET | Freq: Every day | ORAL | 4 refills | Status: DC

## 2019-01-02 MED ORDER — SIMVASTATIN 40 MG PO TABS: 40.0000 mg | ORAL_TABLET | Freq: Every day | ORAL | 4 refills | Status: DC

## 2019-01-02 NOTE — Assessment & Plan Note (Signed)
The current medical regimen is effective;  continue present plan and medications.  

## 2019-01-02 NOTE — Progress Notes (Signed)
   Wt 160 lb (72.6 kg)   BMI 23.63 kg/m    Subjective:    Patient ID: Holly Morales, female    DOB: 27-Sep-1970, 48 y.o.   MRN: [id number]  HPI: Holly Morales is a 48 y.o. female  Med check  Telemedicine using audio/video telecommunications for a synchronous communication visit. Today's visit due to COVID-19 isolation precautions I connected with and verified that I am speaking with the correct person using two identifiers.   I discussed the limitations, risks, security and privacy concerns of performing an evaluation and management service by telecommunication and the availability of in person appointments. I also discussed with the patient that there may be a patient responsible charge related to this service. The patient expressed understanding and agreed to proceed. The patient's location is home. I am at home.  Discussed with patient all in all doing well has lost 12% of her body weight with Saxenda and eating healthy.  Patient working from home part-time and at work part-time at [employer]. Stop Januvia because blood sugar was getting too low.  Cut back metformin from 1002 times a day to 500 morning and evening and blood sugars doing very well with good control.  No low blood pressure symptoms continues to take lisinopril. Continues to take simvastatin without problems. Takes gabapentin for carpal tunnel.  And still has Saxenda for weight loss which she is doing well.  Relevant past medical, surgical, family and social history reviewed and updated as indicated. Interim medical history since our last visit reviewed. Allergies and medications reviewed and updated.  Review of Systems  Constitutional: Negative.   Respiratory: Negative.   Cardiovascular: Negative.     Per HPI unless specifically indicated above     Objective:    Wt 160 lb (72.6 kg)   BMI 23.63 kg/m   Wt Readings from Last 3 Encounters:  01/02/19 160 lb (72.6 kg)  09/11/18 183 lb (83 kg)  06/25/18 181 lb (82.1  kg)    Physical Exam  Results for orders placed or performed in visit on 09/11/18  Bayer DCA Hb A1c Waived  Result Value Ref Range   HB A1C (BAYER DCA - WAIVED) 5.7 <7.0 %      Assessment & Plan:   Problem List Items Addressed This Visit      Cardiovascular and Mediastinum   Obesity, diabetes, and hypertension syndrome (HCC)    Good weight loss been able to reduce diabetes medications A1c pending      Hypertension    The current medical regimen is effective;  continue present plan and medications.         Other   Hypercholesteremia    The current medical regimen is effective;  continue present plan and medications.          I discussed the assessment and treatment plan with the patient. The patient was provided an opportunity to ask questions and all were answered. The patient agreed with the plan and demonstrated an understanding of the instructions.   The patient was advised to call back or seek an in-person evaluation if the symptoms worsen or if the condition fails to improve as anticipated.   I provided 21+ minutes of time during this encounter. Follow up plan: Return in about 6 months (around 07/05/2019) for Hemoglobin A1c, BMP,  Lipids, ALT, AST. Apt PE next week

## 2019-01-02 NOTE — Addendum Note (Signed)
Addended by: Vonita Moss A on: 01/02/2019 09:02 AM   Modules accepted: Orders

## 2019-01-02 NOTE — Assessment & Plan Note (Signed)
Good weight loss been able to reduce diabetes medications A1c pending

## 2019-01-03 LAB — COMPREHENSIVE METABOLIC PANEL
ALT: 20 IU/L (ref 0–32)
AST: 18 IU/L (ref 0–40)
Albumin/Globulin Ratio: 2.1 (ref 1.2–2.2)
Albumin: 4.8 g/dL (ref 3.8–4.8)
Alkaline Phosphatase: 80 IU/L (ref 39–117)
BUN/Creatinine Ratio: 14 (ref 9–23)
BUN: 8 mg/dL (ref 6–24)
Bilirubin Total: 0.4 mg/dL (ref 0.0–1.2)
CO2: 24 mmol/L (ref 20–29)
Calcium: 9.7 mg/dL (ref 8.7–10.2)
Chloride: 98 mmol/L (ref 96–106)
Creatinine, Ser: 0.57 mg/dL (ref 0.57–1.00)
GFR calc Af Amer: 126 mL/min/{1.73_m2} (ref >59)
GFR calc non Af Amer: 109 mL/min/{1.73_m2} (ref >59)
Globulin, Total: 2.3 g/dL (ref 1.5–4.5)
Glucose: 101 mg/dL — ABNORMAL HIGH (ref 65–99)
Potassium: 4.1 mmol/L (ref 3.5–5.2)
Sodium: 139 mmol/L (ref 134–144)
Total Protein: 7.1 g/dL (ref 6.0–8.5)

## 2019-01-03 LAB — CBC WITH DIFFERENTIAL/PLATELET
Basophils Absolute: 0.1 10*3/uL (ref 0.0–0.2)
Basos: 1 %
EOS (ABSOLUTE): 0.2 10*3/uL (ref 0.0–0.4)
Eos: 3 %
Hematocrit: 41.6 % (ref 34.0–46.6)
Hemoglobin: 14 g/dL (ref 11.1–15.9)
Immature Grans (Abs): 0 10*3/uL (ref 0.0–0.1)
Immature Granulocytes: 0 %
Lymphocytes Absolute: 1.7 10*3/uL (ref 0.7–3.1)
Lymphs: 26 %
MCH: 33.9 pg — ABNORMAL HIGH (ref 26.6–33.0)
MCHC: 33.7 g/dL (ref 31.5–35.7)
MCV: 101 fL — ABNORMAL HIGH (ref 79–97)
Monocytes Absolute: 0.4 10*3/uL (ref 0.1–0.9)
Monocytes: 6 %
Neutrophils Absolute: 4.3 10*3/uL (ref 1.4–7.0)
Neutrophils: 64 %
Platelets: 280 10*3/uL (ref 150–450)
RBC: 4.13 x10E6/uL (ref 3.77–5.28)
RDW: 13 % (ref 11.7–15.4)
WBC: 6.7 10*3/uL (ref 3.4–10.8)

## 2019-01-03 LAB — TSH: TSH: 1.47 u[IU]/mL (ref 0.450–4.500)

## 2019-01-03 LAB — LIPID PANEL
Chol/HDL Ratio: 2.1 ratio (ref 0.0–4.4)
Cholesterol, Total: 144 mg/dL (ref 100–199)
HDL: 68 mg/dL (ref >39)
LDL Calculated: 65 mg/dL (ref 0–99)
Triglycerides: 56 mg/dL (ref 0–149)
VLDL Cholesterol Cal: 11 mg/dL (ref 5–40)

## 2019-01-04 ENCOUNTER — Other Ambulatory Visit: Payer: Self-pay

## 2019-01-07 ENCOUNTER — Other Ambulatory Visit (HOSPITAL_COMMUNITY)
Admission: RE | Admit: 2019-01-07 | Discharge: 2019-01-07 | Disposition: A | Discharge status: Home / Self Care | Payer: BC Managed Care – PPO | Source: Ambulatory Visit | Attending: Family Medicine | Admitting: Family Medicine

## 2019-01-07 ENCOUNTER — Other Ambulatory Visit: Payer: Self-pay

## 2019-01-07 ENCOUNTER — Ambulatory Visit: Payer: BC Managed Care – PPO | Admitting: Nurse Practitioner

## 2019-01-07 ENCOUNTER — Encounter: Payer: Self-pay | Admitting: Nurse Practitioner

## 2019-01-07 VITALS — BP 126/74 | HR 70 | Temp 98.6°F | Ht 65.5 in | Wt 165.0 lb

## 2019-01-07 DIAGNOSIS — I10 Essential (primary) hypertension: Secondary | ICD-10-CM

## 2019-01-07 DIAGNOSIS — E669 Obesity, unspecified: Secondary | ICD-10-CM

## 2019-01-07 DIAGNOSIS — E1169 Type 2 diabetes mellitus with other specified complication: Secondary | ICD-10-CM | POA: Diagnosis not present

## 2019-01-07 DIAGNOSIS — E785 Hyperlipidemia, unspecified: Secondary | ICD-10-CM

## 2019-01-07 DIAGNOSIS — Z0000 Encounter for general adult medical examination without abnormal findings: Secondary | ICD-10-CM

## 2019-01-07 DIAGNOSIS — I152 Hypertension secondary to endocrine disorders: Secondary | ICD-10-CM

## 2019-01-07 DIAGNOSIS — E1159 Type 2 diabetes mellitus with other circulatory complications: Secondary | ICD-10-CM

## 2019-01-07 MED ORDER — METFORMIN HCL 500 MG PO TABS: 1000.0000 mg | ORAL_TABLET | Freq: Two times a day (BID) | ORAL | 3 refills | Status: DC

## 2019-01-07 NOTE — Patient Instructions (Signed)
Pap Test  Why am I having this test?  A Pap test, also called a Pap smear, is a screening test to check for signs of:  · Cancer of the vagina, cervix, and uterus. The cervix is the lower part of the uterus that opens into the vagina.  · Infection.  · Changes that may be a sign that cancer is developing (precancerous changes).  Women need this test on a regular basis. In general, you should have a Pap test every 3 years until you reach menopause or age 50. Women aged 30-60 may choose to have their Pap test done at the same time as an HPV (human papillomavirus) test every 5 years (instead of every 3 years).  Your health care provider may recommend having Pap tests more or less often depending on your medical conditions and past Pap test results.  What kind of sample is taken?    Your health care provider will collect a sample of cells from the surface of your cervix. This will be done using a small cotton swab, plastic spatula, or brush. This sample is often collected during a pelvic exam, when you are lying on your back on an exam table with feet in footrests (stirrups).  In some cases, fluids (secretions) from the cervix or vagina may also be collected.  How do I prepare for this test?  · Be aware of where you are in your menstrual cycle. If you are menstruating on the day of the test, you may be asked to reschedule.  · You may need to reschedule if you have a known vaginal infection on the day of the test.  · Follow instructions from your health care provider about:  ? Changing or stopping your regular medicines. Some medicines can cause abnormal test results, such as digitalis and tetracycline.  ? Avoiding douching or taking a bath the day before or the day of the test.  Tell a health care provider about:  · Any allergies you have.  · All medicines you are taking, including vitamins, herbs, eye drops, creams, and over-the-counter medicines.  · Any blood disorders you have.  · Any surgeries you have had.  · Any  medical conditions you have.  · Whether you are pregnant or may be pregnant.  How are the results reported?  Your test results will be reported as either abnormal or normal.  A false-positive result can occur. A false positive is incorrect because it means that a condition is present when it is not.  A false-negative result can occur. A false negative is incorrect because it means that a condition is not present when it is.  What do the results mean?  A normal test result means that you do not have signs of cancer of the vagina, cervix, or uterus.  An abnormal result may mean that you have:  · Cancer. A Pap test by itself is not enough to diagnose cancer. You will have more tests done in this case.  · Precancerous changes in your vagina, cervix, or uterus.  · Inflammation of the cervix.  · An STD (sexually transmitted disease).  · A fungal infection.  · A parasite infection.  Talk with your health care provider about what your results mean.  Questions to ask your health care provider  Ask your health care provider, or the department that is doing the test:  · When will my results be ready?  · How will I get my results?  · What are my   treatment options?  · What other tests do I need?  · What are my next steps?  Summary  · In general, women should have a Pap test every 3 years until they reach menopause or age 50.  · Your health care provider will collect a sample of cells from the surface of your cervix. This will be done using a small cotton swab, plastic spatula, or brush.  · In some cases, fluids (secretions) from the cervix or vagina may also be collected.  This information is not intended to replace advice given to you by your health care provider. Make sure you discuss any questions you have with your health care provider.  Document Released: 10/15/2002 Document Revised: 04/03/2017 Document Reviewed: 04/03/2017  Elsevier Interactive Patient Education © 2019 [employer]

## 2019-01-07 NOTE — Assessment & Plan Note (Signed)
Chronic, stable with 20 pound weight loss since January and current A1C 5.4.  Continue current medication regimen and continue reduction of medications at next visit if remains stable and has continued weight loss.

## 2019-01-07 NOTE — Assessment & Plan Note (Signed)
Chronic, stable with recent LDL 65 and TCHOL 144 + WNL AST and ALT.  Continue current medication regimen.

## 2019-01-07 NOTE — Assessment & Plan Note (Signed)
Chronic, stable.  Initial BP slightly above goal with repeat below goal.  Recommended checking BP at home three mornings a week and documenting for visits.  Continue current medication regimen.  Recent labs WNL kidney function.

## 2019-01-07 NOTE — Progress Notes (Signed)
BP 126/74   Pulse 70   Temp 98.6 F (37 C) (Oral)   Ht 5' 5.5" (1.664 m)   Wt 165 lb (74.8 kg)   LMP 11/06/2017 (Approximate)   SpO2 100%   BMI 27.04 kg/m    Subjective:    Patient ID: Holly Morales, female    DOB: 07/23/1971, 48 y.o.   MRN: [id number]  HPI: Holly Morales is a 48 y.o. female presenting on 01/07/2019 for comprehensive medical examination. Current medical complaints include:none  She currently lives with: self Menopausal Symptoms: no, last period was one year ago and she denies symptoms  DIABETES Recent A1C 5.4,  Continues on Metformin &Saxenda. Hypoglycemic episodes:no Polydipsia/polyuria: no Visual disturbance: no Chest pain: no Paresthesias: no Glucose Monitoring: yes  Accucheck frequency: every other day  Fasting glucose: 110's in mornings  Post prandial:  Evening:  Before meals: Taking Insulin?: no  Long acting insulin:  Short acting insulin: Blood Pressure Monitoring: not checking Retinal Examination: Up to Date Foot Exam: Up to Date Pneumovax: Up to Date Influenza: Up to Date Aspirin: no   HYPERTENSION / HYPERLIPIDEMIA Continues on Simvastatin for HLD and Lisinopril for HTN. Satisfied with current treatment? yes Duration of hypertension: chronic BP monitoring frequency: not checking BP range:  BP medication side effects: no Duration of hyperlipidemia: chronic Cholesterol medication side effects: no Cholesterol supplements: none Medication compliance: good compliance Aspirin: no Recent stressors: no Recurrent headaches: no Visual changes: no Palpitations: no Dyspnea: no Chest pain: no Lower extremity edema: no Dizzy/lightheaded: no  Depression Screen done today and results listed below:  Depression screen [facility] 2/9 01/07/2019 09/11/2018 03/01/2018 11/30/2017 05/22/2017  Decreased Interest 0 0 0 0 0  Down, Depressed, Hopeless 0 0 0 0 0  PHQ - 2 Score 0 0 0 0 0  Altered sleeping 0 2 - - -  Tired, decreased energy 0 2 - - -  Change in  appetite 0 0 - - -  Feeling bad or failure about yourself  0 0 - - -  Trouble concentrating 0 0 - - -  Moving slowly or fidgety/restless 0 0 - - -  Suicidal thoughts 0 0 - - -  PHQ-9 Score 0 4 - - -  Difficult doing work/chores Not difficult at all - - - -    The patient does not have a history of falls. I did complete a risk assessment for falls. A plan of care for falls was documented.   Past Medical History:  Past Medical History:  Diagnosis Date  . Depression    06/25/18 no longer an issue per pt   . Diabetes mellitus without complication (HCC)   . High cholesterol   . Obesity (BMI 30-39.9)     Surgical History:  Past Surgical History:  Procedure Laterality Date  . LEEP  2009  . NO PAST SURGERIES      Medications:  Current Outpatient Medications on File Prior to Visit  Medication Sig  . cyclobenzaprine (FLEXERIL) 5 MG tablet Take 1 tablet (5 mg total) by mouth 3 (three) times daily as needed for muscle spasms.  Marland Kitchen gabapentin (NEURONTIN) 300 MG capsule TAKE 1 CAPSULE BY MOUTH THREE TIMES A DAY  . glucose blood (ONE TOUCH ULTRA TEST) test strip USE TO TEST BLOOD SUGAR DAILY  . ibuprofen (ADVIL,MOTRIN) 800 MG tablet Take 1 tablet (800 mg total) by mouth every 8 (eight) hours as needed.  . Insulin Pen Needle (PEN NEEDLES) 31G X 8 MM MISC 1  each by Does not apply route daily. Use one needle daily to inject Saxenda  . Liraglutide -Weight Management (SAXENDA) 18 MG/3ML SOPN Inject 0.6 mg into the skin once a week for 7 days, THEN 1.2 mg once a week for 7 days, THEN 1.8 mg once a week for 7 days, THEN 2.4 mg once a week for 7 days, THEN 3 mg once a week.  Marland Kitchen lisinopril (ZESTRIL) 10 MG tablet Take 1 tablet (10 mg total) by mouth daily.  . metFORMIN (GLUCOPHAGE) 500 MG tablet Take 2 tablets (1,000 mg total) by mouth 2 (two) times daily with a meal.  . ONETOUCH DELICA LANCETS 33G MISC USE TO TEST BLOOD SUGAR DAILY  . simvastatin (ZOCOR) 40 MG tablet Take 1 tablet (40 mg total) by  mouth at bedtime.  . valACYclovir (VALTREX) 1000 MG tablet TAKE 1 TABLET BY MOUTH TWICE A DAY   No current facility-administered medications on file prior to visit.     Allergies:  Allergies  Allergen Reactions  . Jardiance [Empagliflozin]     Candidiasis    Social History:  Social History   Socioeconomic History  . Marital status: Divorced    Spouse name: Not on file  . Number of children: 0  . Years of education: Not on file  . Highest education level: High school graduate  Occupational History  . Not on file  Social Needs  . Financial resource strain: Not on file  . Food insecurity:    Worry: Not on file    Inability: Not on file  . Transportation needs:    Medical: Not on file    Non-medical: Not on file  Tobacco Use  . Smoking status: Current Every Day Smoker    Packs/day: 0.50    Types: Cigarettes  . Smokeless tobacco: Never Used  Substance and Sexual Activity  . Alcohol use: Yes    Alcohol/week: 1.0 standard drinks    Types: 1 Standard drinks or equivalent per week    Comment: on occasion  . Drug use: No    Comment: 06/25/18--30 yrs ago smoked marijuana  . Sexual activity: Not on file  Lifestyle  . Physical activity:    Days per week: Not on file    Minutes per session: Not on file  . Stress: Not on file  Relationships  . Social connections:    Talks on phone: Not on file    Gets together: Not on file    Attends religious service: Not on file    Active member of club or organization: Not on file    Attends meetings of clubs or organizations: Not on file    Relationship status: Not on file  . Intimate partner violence:    Fear of current or ex partner: Not on file    Emotionally abused: Not on file    Physically abused: Not on file    Forced sexual activity: Not on file  Other Topics Concern  . Not on file  Social History Narrative   Lives at home alone   Right handed   Caffeine: 1 pot of coffee every morning, 1 cup of unsweet tea daily, lots  of water, very seldom soda.   Social History   Tobacco Use  Smoking Status Current Every Day Smoker  . Packs/day: 0.50  . Types: Cigarettes  Smokeless Tobacco Never Used   Social History   Substance and Sexual Activity  Alcohol Use Yes  . Alcohol/week: 1.0 standard drinks  . Types: 1 Standard  drinks or equivalent per week   Comment: on occasion    Family History:  Family History  Problem Relation Age of Onset  . Diabetes Father   . Cancer Father        Prostate  . Irregular heart beat Father     Past medical history, surgical history, medications, allergies, family history and social history reviewed with patient today and changes made to appropriate areas of the chart.   Review of Systems - Negative All other ROS negative except what is listed above and in the HPI.      Objective:    BP 126/74   Pulse 70   Temp 98.6 F (37 C) (Oral)   Ht 5' 5.5" (1.664 m)   Wt 165 lb (74.8 kg)   LMP 11/06/2017 (Approximate)   SpO2 100%   BMI 27.04 kg/m   Wt Readings from Last 3 Encounters:  01/07/19 165 lb (74.8 kg)  01/02/19 160 lb (72.6 kg)  09/11/18 183 lb (83 kg)    Physical Exam Vitals signs and nursing note reviewed.  Constitutional:      General: She is awake. She is not in acute distress.    Appearance: She is well-developed and overweight. She is not ill-appearing.  HENT:     Head: Normocephalic.     Right Ear: Hearing, tympanic membrane, ear canal and external ear normal.     Left Ear: Hearing, tympanic membrane, ear canal and external ear normal.     Nose: Nose normal.     Mouth/Throat:     Mouth: Mucous membranes are moist.  Eyes:     General: Lids are normal.        Right eye: No discharge.        Left eye: No discharge.     Extraocular Movements: Extraocular movements intact.     Conjunctiva/sclera: Conjunctivae normal.     Pupils: Pupils are equal, round, and reactive to light.     Visual Fields: Right eye visual fields normal and left eye visual  fields normal.  Neck:     Musculoskeletal: Normal range of motion and neck supple.     Thyroid: No thyromegaly.     Vascular: No carotid bruit.  Cardiovascular:     Rate and Rhythm: Normal rate and regular rhythm.     Heart sounds: Normal heart sounds. No murmur. No gallop.   Pulmonary:     Effort: Pulmonary effort is normal.     Breath sounds: Normal breath sounds.  Chest:     Breasts:        Right: Normal. No swelling, bleeding, inverted nipple, mass or nipple discharge.        Left: Normal. No swelling, bleeding, inverted nipple, mass or nipple discharge.  Abdominal:     General: Bowel sounds are normal.     Palpations: Abdomen is soft. There is no hepatomegaly or splenomegaly.     Tenderness: There is no abdominal tenderness.     Hernia: There is no hernia in the right inguinal area or left inguinal area.  Genitourinary:    Exam position: Supine.     Pubic Area: No rash.      Labia:        Right: No rash.        Left: No rash.      Vagina: Normal.     Cervix: Normal.     Uterus: Normal.      Adnexa: Right adnexa normal.     Comments:  Cervix located slightly anterior and to 2 o'clock position. Musculoskeletal:     Right lower leg: No edema.     Left lower leg: No edema.  Lymphadenopathy:     Cervical: No cervical adenopathy.     Upper Body:     Right upper body: No supraclavicular, axillary or pectoral adenopathy.     Left upper body: No supraclavicular, axillary or pectoral adenopathy.  Skin:    General: Skin is warm and dry.  Neurological:     Mental Status: She is alert and oriented to person, place, and time.  Psychiatric:        Attention and Perception: Attention normal.        Mood and Affect: Mood normal.        Behavior: Behavior normal. Behavior is cooperative.        Thought Content: Thought content normal.        Judgment: Judgment normal.    Diabetic Foot Exam - Simple   Simple Foot Form Visual Inspection See comments:  Yes Sensation Testing  Intact to touch and monofilament testing bilaterally:  Yes Pulse Check Posterior Tibialis and Dorsalis pulse intact bilaterally:  Yes Comments Calluses noted to bilateral feet on pedal aspect by great toes     Results for orders placed or performed in visit on 01/02/19  Urinalysis, Routine w reflex microscopic  Result Value Ref Range   Specific Gravity, UA 1.010 1.005 - 1.030   pH, UA 7.0 5.0 - 7.5   Color, UA Yellow Yellow   Appearance Ur Clear Clear   Leukocytes,UA Negative Negative   Protein,UA Negative Negative/Trace   Glucose, UA Negative Negative   Ketones, UA Negative Negative   RBC, UA Negative Negative   Bilirubin, UA Negative Negative   Urobilinogen, Ur 0.2 0.2 - 1.0 mg/dL   Nitrite, UA Negative Negative  TSH  Result Value Ref Range   TSH 1.470 0.450 - 4.500 uIU/mL  CBC with Differential/Platelet  Result Value Ref Range   WBC 6.7 3.4 - 10.8 x10E3/uL   RBC 4.13 3.77 - 5.28 x10E6/uL   Hemoglobin 14.0 11.1 - 15.9 g/dL   Hematocrit 96.0 45.4 - 46.6 %   MCV 101 (H) 79 - 97 fL   MCH 33.9 (H) 26.6 - 33.0 pg   MCHC 33.7 31.5 - 35.7 g/dL   RDW 09.8 11.9 - 14.7 %   Platelets 280 150 - 450 x10E3/uL   Neutrophils 64 Not Estab. %   Lymphs 26 Not Estab. %   Monocytes 6 Not Estab. %   Eos 3 Not Estab. %   Basos 1 Not Estab. %   Neutrophils Absolute 4.3 1.4 - 7.0 x10E3/uL   Lymphocytes Absolute 1.7 0.7 - 3.1 x10E3/uL   Monocytes Absolute 0.4 0.1 - 0.9 x10E3/uL   EOS (ABSOLUTE) 0.2 0.0 - 0.4 x10E3/uL   Basophils Absolute 0.1 0.0 - 0.2 x10E3/uL   Immature Granulocytes 0 Not Estab. %   Immature Grans (Abs) 0.0 0.0 - 0.1 x10E3/uL  Lipid panel  Result Value Ref Range   Cholesterol, Total 144 100 - 199 mg/dL   Triglycerides 56 0 - 149 mg/dL   HDL 68 >82 mg/dL   VLDL Cholesterol Cal 11 5 - 40 mg/dL   LDL Calculated 65 0 - 99 mg/dL   Chol/HDL Ratio 2.1 0.0 - 4.4 ratio  Comprehensive metabolic panel  Result Value Ref Range   Glucose 101 (H) 65 - 99 mg/dL   BUN 8 6 - 24  mg/dL  Creatinine, Ser 0.57 0.57 - 1.00 mg/dL   GFR calc non Af Amer 109 >59 mL/min/1.73   GFR calc Af Amer 126 >59 mL/min/1.73   BUN/Creatinine Ratio 14 9 - 23   Sodium 139 134 - 144 mmol/L   Potassium 4.1 3.5 - 5.2 mmol/L   Chloride 98 96 - 106 mmol/L   CO2 24 20 - 29 mmol/L   Calcium 9.7 8.7 - 10.2 mg/dL   Total Protein 7.1 6.0 - 8.5 g/dL   Albumin 4.8 3.8 - 4.8 g/dL   Globulin, Total 2.3 1.5 - 4.5 g/dL   Albumin/Globulin Ratio 2.1 1.2 - 2.2   Bilirubin Total 0.4 0.0 - 1.2 mg/dL   Alkaline Phosphatase 80 39 - 117 IU/L   AST 18 0 - 40 IU/L   ALT 20 0 - 32 IU/L  Bayer DCA Hb A1c Waived  Result Value Ref Range   HB A1C (BAYER DCA - WAIVED) 5.4 <7.0 %      Assessment & Plan:   Problem List Items Addressed This Visit      Cardiovascular and Mediastinum   Obesity, diabetes, and hypertension syndrome (HCC)    Chronic, stable with 20 pound weight loss since January and current A1C 5.4.  Continue current medication regimen and continue reduction of medications at next visit if remains stable and has continued weight loss.        Hypertension    Chronic, stable.  Initial BP slightly above goal with repeat below goal.  Recommended checking BP at home three mornings a week and documenting for visits.  Continue current medication regimen.  Recent labs WNL kidney function.        Endocrine   Hyperlipidemia associated with type 2 diabetes mellitus (HCC)    Chronic, stable with recent LDL 65 and TCHOL 144 + WNL AST and ALT.  Continue current medication regimen.         Other Visit Diagnoses    Encounter for annual health examination    -  Primary   Pap testing sent   Relevant Orders   Cytology - PAP       Follow up plan: Return in about 6 months (around 07/09/2019) for T2DM, HTN/HLD.   LABORATORY TESTING:  - Pap smear: pap done  IMMUNIZATIONS:   - Tdap: Tetanus vaccination status reviewed: last tetanus booster within 10 years. - Influenza: Up to date - Pneumovax: Up to  date - Prevnar: Not applicable - HPV: Not applicable - Zostavax vaccine: Not applicable  SCREENING: -Mammogram: Up to date  - Colonoscopy: Up to date  - Bone Density: Not applicable  -Hearing Test: Not applicable  -Spirometry: Not applicable   PATIENT COUNSELING:   Advised to take 1 mg of folate supplement per day if capable of pregnancy.   Sexuality: Discussed sexually transmitted diseases, partner selection, use of condoms, avoidance of unintended pregnancy  and contraceptive alternatives.   Advised to avoid cigarette smoking.  I discussed with the patient that most people either abstain from alcohol or drink within safe limits (<=14/week and <=4 drinks/occasion for males, <=7/weeks and <= 3 drinks/occasion for females) and that the risk for alcohol disorders and other health effects rises proportionally with the number of drinks per week and how often a drinker exceeds daily limits.  Discussed cessation/primary prevention of drug use and availability of treatment for abuse.   Diet: Encouraged to adjust caloric intake to maintain  or achieve ideal body weight, to reduce intake of dietary saturated fat and total  fat, to limit sodium intake by avoiding high sodium foods and not adding table salt, and to maintain adequate dietary potassium and calcium preferably from fresh fruits, vegetables, and low-fat dairy products.    stressed the importance of regular exercise  Injury prevention: Discussed safety belts, safety helmets, smoke detector, smoking near bedding or upholstery.   Dental health: Discussed importance of regular tooth brushing, flossing, and dental visits.    NEXT PREVENTATIVE PHYSICAL DUE IN 1 YEAR. Return in about 6 months (around 07/09/2019) for T2DM, HTN/HLD.

## 2019-01-11 ENCOUNTER — Ambulatory Visit
Admission: RE | Admit: 2019-01-11 | Discharge: 2019-01-11 | Disposition: A | Discharge status: Home / Self Care | Payer: BC Managed Care – PPO | Source: Ambulatory Visit | Attending: Family Medicine | Admitting: Family Medicine

## 2019-01-11 ENCOUNTER — Other Ambulatory Visit: Payer: Self-pay

## 2019-01-11 ENCOUNTER — Other Ambulatory Visit: Payer: Self-pay | Admitting: Nurse Practitioner

## 2019-01-11 ENCOUNTER — Telehealth: Payer: Self-pay

## 2019-01-11 DIAGNOSIS — N6310 Unspecified lump in the right breast, unspecified quadrant: Secondary | ICD-10-CM

## 2019-01-11 DIAGNOSIS — R928 Other abnormal and inconclusive findings on diagnostic imaging of breast: Secondary | ICD-10-CM

## 2019-01-11 DIAGNOSIS — R87612 Low grade squamous intraepithelial lesion on cytologic smear of cervix (LGSIL): Secondary | ICD-10-CM

## 2019-01-11 LAB — CYTOLOGY - PAP
Adequacy: ABSENT — AB
HPV: DETECTED — AB

## 2019-01-11 NOTE — Progress Notes (Signed)
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  Spoke to patient via telephone and she has been see at [facility] in past, last 3 years ago.  Discussed that further procedures and evaluation would have to be done based on current findings.  She agreed with plan of care and voiced understanding.

## 2019-01-11 NOTE — Telephone Encounter (Signed)
PA for Saxenda submitted and approved on Cover My Meds.

## 2019-02-11 ENCOUNTER — Other Ambulatory Visit: Payer: Self-pay | Admitting: Family Medicine

## 2019-02-11 NOTE — Telephone Encounter (Signed)
Requested Prescriptions  Pending Prescriptions Disp Refills  . ibuprofen (ADVIL) 800 MG tablet [Pharmacy Med Name: IBUPROFEN 800 MG TABLET] 90 tablet 5    Sig: TAKE 1 TABLET BY MOUTH EVERY 8 HOURS AS NEEDED     Analgesics:  NSAIDS Passed - 02/11/2019  1:22 AM      Passed - Cr in normal range and within 360 days    Creatinine, Ser  Date Value Ref Range Status  01/02/2019 0.57 0.57 - 1.00 mg/dL Final         Passed - HGB in normal range and within 360 days    Hemoglobin  Date Value Ref Range Status  01/02/2019 14.0 11.1 - 15.9 g/dL Final         Passed - Patient is not pregnant      Passed - Valid encounter within last 12 months    Recent Outpatient Visits          1 month ago Encounter for annual health examination   [place], Henrine Screws T, NP   1 month ago PE (physical exam), annual   [place], Jeannette How, MD   5 months ago Diabetes mellitus without complication [facility])   [place], Jeannette How, MD   8 months ago Essential hypertension   [place] [facility], Jeannette How, MD   9 months ago [place], Wendee Beavers, PA-C      Future Appointments            In 4 months Cannady, Barbaraann Faster, NP [employer], [facility]

## 2019-05-28 ENCOUNTER — Other Ambulatory Visit: Payer: Self-pay | Admitting: Nurse Practitioner

## 2019-05-28 ENCOUNTER — Other Ambulatory Visit: Payer: Self-pay | Admitting: Family Medicine

## 2019-05-28 MED ORDER — SAXENDA 18 MG/3ML ~~LOC~~ SOPN: PEN_INJECTOR | SUBCUTANEOUS | 6 refills | Status: AC

## 2019-05-28 NOTE — Telephone Encounter (Signed)
Requested medication (s) are due for refill today: no  Requested medication (s) are on the active medication list: no  Last refill:  05/02/2019  Future visit scheduled: yes  Notes to clinic:  Not on current medication list   Requested Prescriptions  Pending Prescriptions Disp Refills   SAXENDA 18 MG/3ML SOPN [Pharmacy Med Name: SAXENDA 18 MG/3 ML PEN]      Sig: INJECT 0.6 MG INTO THE SKIN ONCE A WEEK FOR 7 DAYS, THEN 1.2 MG ONCE A WEEK FOR 7 DAYS.     Endocrinology:  Diabetes - GLP-1 Receptor Agonists Passed - 05/28/2019  1:26 AM      Passed - HBA1C is between 0 and 7.9 and within 180 days    HB A1C (BAYER DCA - WAIVED)  Date Value Ref Range Status  01/02/2019 5.4 <7.0 % Final    Comment:                                          Diabetic Adult            <7.0                                       Healthy Adult        4.3 - 5.7                                                           (DCCT/NGSP) American Diabetes Association's Summary of Glycemic Recommendations for Adults with Diabetes: Hemoglobin A1c <7.0%. More stringent glycemic goals (A1c <6.0%) may further reduce complications at the cost of increased risk of hypoglycemia.          Passed - Valid encounter within last 6 months    Recent Outpatient Visits          4 months ago Encounter for annual health examination   [facility] [place], Henrine Screws T, NP   4 months ago PE (physical exam), annual   [place] [facility], Jeannette How, MD   8 months ago Diabetes mellitus without complication [facility])   [place] Crissman, Jeannette How, MD   11 months ago Essential hypertension   [place], Jeannette How, MD   1 year ago H[place], [place], PA-C      Future Appointments            In 1 month Cannady, Barbaraann Faster, NP [employer], [facility]

## 2019-05-28 NOTE — Telephone Encounter (Signed)
Not on current medication list. Can we see where this came from?  Please call patient.  Her last A1C was stable on Metformin.

## 2019-05-28 NOTE — Telephone Encounter (Signed)
Patient notified

## 2019-05-28 NOTE — Telephone Encounter (Signed)
Called and spoke with patient, she states that she has been on Saxenda since February, patient states that she takes the Ada in the morning and Metformin 1 tablet BID, she would like to know if she can stop the am Metformin and see how she does with just one Metformin a day with the injection in the morning, she takes 2.4 daily.

## 2019-07-06 ENCOUNTER — Encounter: Payer: Self-pay | Admitting: Nurse Practitioner

## 2019-07-08 ENCOUNTER — Telehealth: Payer: Self-pay | Admitting: Family Medicine

## 2019-07-08 ENCOUNTER — Other Ambulatory Visit: Payer: Self-pay

## 2019-07-08 NOTE — Telephone Encounter (Signed)
PT would rather do in office due to labs and her change in medication and Pt stated this apt is the only time she can come in due to her works schedule and coworker going on medical leave.Please advise.

## 2019-07-08 NOTE — Telephone Encounter (Signed)
Pt.notified

## 2019-07-08 NOTE — Telephone Encounter (Signed)
Sounds good.  We can do in office for her visit.

## 2019-07-09 ENCOUNTER — Encounter: Payer: Self-pay | Admitting: Nurse Practitioner

## 2019-07-09 ENCOUNTER — Ambulatory Visit: Payer: BC Managed Care – PPO | Admitting: Nurse Practitioner

## 2019-07-09 ENCOUNTER — Other Ambulatory Visit: Payer: Self-pay

## 2019-07-09 VITALS — BP 121/78 | HR 83 | Temp 98.5°F | Wt 169.0 lb

## 2019-07-09 DIAGNOSIS — E1169 Type 2 diabetes mellitus with other specified complication: Secondary | ICD-10-CM | POA: Diagnosis not present

## 2019-07-09 DIAGNOSIS — Z6827 Body mass index (BMI) 27.0-27.9, adult: Secondary | ICD-10-CM

## 2019-07-09 DIAGNOSIS — E785 Hyperlipidemia, unspecified: Secondary | ICD-10-CM

## 2019-07-09 DIAGNOSIS — R87612 Low grade squamous intraepithelial lesion on cytologic smear of cervix (LGSIL): Secondary | ICD-10-CM | POA: Diagnosis not present

## 2019-07-09 DIAGNOSIS — E1140 Type 2 diabetes mellitus with diabetic neuropathy, unspecified: Secondary | ICD-10-CM

## 2019-07-09 DIAGNOSIS — E1159 Type 2 diabetes mellitus with other circulatory complications: Secondary | ICD-10-CM | POA: Diagnosis not present

## 2019-07-09 DIAGNOSIS — I10 Essential (primary) hypertension: Secondary | ICD-10-CM

## 2019-07-09 LAB — BAYER DCA HB A1C WAIVED: HB A1C (BAYER DCA - WAIVED): 5.3 % (ref <7.0)

## 2019-07-09 LAB — MICROALBUMIN, URINE WAIVED
Creatinine, Urine Waived: 10 mg/dL (ref 10–300)
Microalb, Ur Waived: 10 mg/L (ref 0–19)
Microalb/Creat Ratio: <30 mg/g (ref <30)

## 2019-07-09 MED ORDER — CYCLOBENZAPRINE HCL 5 MG PO TABS: 5.0000 mg | ORAL_TABLET | Freq: Three times a day (TID) | ORAL | 2 refills | Status: DC | PRN

## 2019-07-09 MED ORDER — METFORMIN HCL 500 MG PO TABS: 500.0000 mg | ORAL_TABLET | Freq: Every day | ORAL | 2 refills | Status: DC

## 2019-07-09 NOTE — Assessment & Plan Note (Signed)
Chronic, stable with BP at goal in office today.  Continue Lisinopril, which offers kidney protection.  Urine micro ALB 10 and A:C <30 today.  Recommend she check BP at home at least 3 mornings a week.

## 2019-07-09 NOTE — Assessment & Plan Note (Signed)
Attempt to get recent GYN records to review and update chart.  Discussed at length with her.

## 2019-07-09 NOTE — Assessment & Plan Note (Signed)
Recommend continued focus on health diet choices and regular physical activity (30 minutes 5 days a week). 

## 2019-07-09 NOTE — Assessment & Plan Note (Signed)
Chronic, stable with current A1C 5.3%.  Continue current medication regimen and continue reduction of medications at next visit if remains stable and has continued weight loss.  At this time she wishes to maintain regimen due to some weight gain with quitting smoking.  Return in 6 months.

## 2019-07-09 NOTE — Progress Notes (Signed)
BP 121/78   Pulse 83   Temp 98.5 F (36.9 C) (Oral)   Wt 169 lb (76.7 kg)   LMP 11/06/2017 (Approximate)   SpO2 99%   BMI 27.70 kg/m    Subjective:    Patient ID: Holly Morales, female    DOB: [birth year], 48 y.o.   MRN: [id number]  HPI: Holly Morales is a 48 y.o. female  Chief Complaint  Patient presents with  . Diabetes  . Hypertension  . Hyperlipidemia   DIABETES Recent A1C 5.4% in May,  Continues on Metformin 500 MG QDAY &Saxenda 0.8 MG weekly.  Takes Gabapentin 300 MG TID for carpal tunnel. Hypoglycemic episodes:no Polydipsia/polyuria: no Visual disturbance: no Chest pain: no Paresthesias: no Glucose Monitoring: yes             Accucheck frequency: every other day             Fasting glucose: 112 to 118 in mornings             Post prandial:             Evening:             Before meals: Taking Insulin?: no             Long acting insulin:             Short acting insulin: Blood Pressure Monitoring: not checking Retinal Examination: Not Up To Date Foot Exam: Up to Date Pneumovax: Up to Date Influenza: Up to Date Aspirin: no, is going to start taking  HYPERTENSION / HYPERLIPIDEMIA Continues on Simvastatin for HLD and Lisinopril for HTN.  Recently quit smoking, one week ago.  Prior to this she smoked 1/2 PPD, had been smoking since 34 and attempted quitting a couple times.  Currently wearing patches that were prescribed via her work place.   Satisfied with current treatment? yes Duration of hypertension: chronic BP monitoring frequency: not checking BP range:  BP medication side effects: no Duration of hyperlipidemia: chronic Cholesterol medication side effects: no Cholesterol supplements: none Medication compliance: good compliance Aspirin: no Recent stressors: no Recurrent headaches: no Visual changes: no Palpitations: no Dyspnea: no Chest pain: no Lower extremity edema: no Dizzy/lightheaded: no  ABNORMAL PAP: Had abnormal pap in June 2020.   She followed up with her GYN in [place].  [facility].  Had biopsy per her report and everything "was good".  No notes on chart noted, will attempt to get notes from GYN.    Relevant past medical, surgical, family and social history reviewed and updated as indicated. Interim medical history since our last visit reviewed. Allergies and medications reviewed and updated.  Review of Systems  Constitutional: Negative for activity change, appetite change, diaphoresis, fatigue and fever.  Respiratory: Negative for cough, chest tightness and shortness of breath.   Cardiovascular: Negative for chest pain, palpitations and leg swelling.  Gastrointestinal: Negative for abdominal distention, abdominal pain, constipation, diarrhea, nausea and vomiting.  Endocrine: Negative for cold intolerance, heat intolerance, polydipsia, polyphagia and polyuria.  Neurological: Negative for dizziness, syncope, weakness, light-headedness, numbness and headaches.  Psychiatric/Behavioral: Negative.     Per HPI unless specifically indicated above     Objective:    BP 121/78   Pulse 83   Temp 98.5 F (36.9 C) (Oral)   Wt 169 lb (76.7 kg)   LMP 11/06/2017 (Approximate)   SpO2 99%   BMI 27.70 kg/m   Wt Readings from Last 3 Encounters:  07/09/19 169  lb (76.7 kg)  01/07/19 165 lb (74.8 kg)  01/02/19 160 lb (72.6 kg)    Physical Exam Vitals signs and nursing note reviewed.  Constitutional:      General: She is awake. She is not in acute distress.    Appearance: She is well-developed and overweight. She is not ill-appearing.  HENT:     Head: Normocephalic.     Right Ear: Hearing normal.     Left Ear: Hearing normal.  Eyes:     General: Lids are normal.        Right eye: No discharge.        Left eye: No discharge.     Conjunctiva/sclera: Conjunctivae normal.     Pupils: Pupils are equal, round, and reactive to light.  Neck:     Musculoskeletal: Normal range of motion and neck supple.     Thyroid: No  thyromegaly.     Vascular: No carotid bruit.  Cardiovascular:     Rate and Rhythm: Normal rate and regular rhythm.     Heart sounds: Normal heart sounds. No murmur. No gallop.   Pulmonary:     Effort: Pulmonary effort is normal. No accessory muscle usage or respiratory distress.     Breath sounds: Normal breath sounds.  Abdominal:     General: Bowel sounds are normal.     Palpations: Abdomen is soft.  Musculoskeletal:     Right lower leg: No edema.     Left lower leg: No edema.  Lymphadenopathy:     Cervical: No cervical adenopathy.  Skin:    General: Skin is warm and dry.  Neurological:     Mental Status: She is alert and oriented to person, place, and time.  Psychiatric:        Attention and Perception: Attention normal.        Mood and Affect: Mood normal.        Behavior: Behavior normal. Behavior is cooperative.        Thought Content: Thought content normal.        Judgment: Judgment normal.    Diabetic Foot Exam - Simple   Simple Foot Form Visual Inspection No deformities, no ulcerations, no other skin breakdown bilaterally: Yes Sensation Testing Intact to touch and monofilament testing bilaterally: Yes Pulse Check Posterior Tibialis and Dorsalis pulse intact bilaterally: Yes Comments     Results for orders placed or performed in visit on 01/07/19  Cytology - PAP  Result Value Ref Range   Adequacy (A)     Satisfactory for evaluation  endocervical/transformation zone component ABSENT.   Diagnosis (A)     LOW GRADE SQUAMOUS INTRAEPITHELIAL LESION: CIN-1/ HPV (LSIL).   HPV DETECTED (A)    Material Submitted CervicoVaginal Pap [ThinPrep Imaged] (A)    CYTOLOGY - PAP PAP RESULT       Assessment & Plan:   Problem List Items Addressed This Visit      Cardiovascular and Mediastinum   Hypertension associated with diabetes (HCC)    Chronic, stable with BP at goal in office today.  Continue Lisinopril, which offers kidney protection.  Urine micro ALB 10 and A:C  <30 today.  Recommend she check BP at home at least 3 mornings a week.      Relevant Medications   metFORMIN (GLUCOPHAGE) 500 MG tablet   Other Relevant Orders   Bayer DCA Hb A1c Waived   Comprehensive metabolic panel     Endocrine   Controlled type 2 diabetes with neuropathy (HCC) -  Primary    Chronic, stable with current A1C 5.3%.  Continue current medication regimen and continue reduction of medications at next visit if remains stable and has continued weight loss.  At this time she wishes to maintain regimen due to some weight gain with quitting smoking.  Return in 6 months.      Relevant Medications   metFORMIN (GLUCOPHAGE) 500 MG tablet   Other Relevant Orders   Bayer DCA Hb A1c Waived   Microalbumin, Urine Waived here   Hyperlipidemia associated with type 2 diabetes mellitus (HCC)    Chronic, stable.  Continue current medication regimen and adjust as needed.  Lipid panel and CMP today.        Relevant Medications   metFORMIN (GLUCOPHAGE) 500 MG tablet   Other Relevant Orders   Bayer DCA Hb A1c Waived   Comprehensive metabolic panel   Lipid Panel w/o Chol/HDL Ratio out     Other   BMI 27.0-27.9,adult    Recommend continued focus on health diet choices and regular physical activity (30 minutes 5 days a week).      Pap smear abnormality of cervix with LGSIL    Attempt to get recent GYN records to review and update chart.  Discussed at length with her.          Follow up plan: Return in about 6 months (around 01/07/2020) for Annual physical (may need repeat pap if has not seen GYN).

## 2019-07-09 NOTE — Assessment & Plan Note (Signed)
Chronic, stable.  Continue current medication regimen and adjust as needed.  Lipid panel and CMP today. 

## 2019-07-09 NOTE — Patient Instructions (Signed)
Carbohydrate Counting for Diabetes Mellitus, Adult  Carbohydrate counting is a method of keeping track of how many carbohydrates you eat. Eating carbohydrates naturally increases the amount of sugar (glucose) in the blood. Counting how many carbohydrates you eat helps keep your blood glucose within normal limits, which helps you manage your diabetes (diabetes mellitus). It is important to know how many carbohydrates you can safely have in each meal. This is different for every person. A diet and nutrition specialist (registered dietitian) can help you make a meal plan and calculate how many carbohydrates you should have at each meal and snack. Carbohydrates are found in the following foods:  Grains, such as breads and cereals.  Dried beans and soy products.  Starchy vegetables, such as potatoes, peas, and corn.  Fruit and fruit juices.  Milk and yogurt.  Sweets and snack foods, such as cake, cookies, candy, chips, and soft drinks. How do I count carbohydrates? There are two ways to count carbohydrates in food. You can use either of the methods or a combination of both. Reading "Nutrition Facts" on packaged food The "Nutrition Facts" list is included on the labels of almost all packaged foods and beverages in the [place] It includes:  The serving size.  Information about nutrients in each serving, including the grams (g) of carbohydrate per serving. To use the "Nutrition Facts":  Decide how many servings you will have.  Multiply the number of servings by the number of carbohydrates per serving.  The resulting number is the total amount of carbohydrates that you will be having. Learning standard serving sizes of other foods When you eat carbohydrate foods that are not packaged or do not include "Nutrition Facts" on the label, you need to measure the servings in order to count the amount of carbohydrates:  Measure the foods that you will eat with a food scale or measuring cup, if needed.   Decide how many standard-size servings you will eat.  Multiply the number of servings by 15. Most carbohydrate-rich foods have about 15 g of carbohydrates per serving. ? For example, if you eat 8 oz (170 g) of strawberries, you will have eaten 2 servings and 30 g of carbohydrates (2 servings x 15 g = 30 g).  For foods that have more than one food mixed, such as soups and casseroles, you must count the carbohydrates in each food that is included. The following list contains standard serving sizes of common carbohydrate-rich foods. Each of these servings has about 15 g of carbohydrates:   hamburger bun or  English muffin.   oz (15 mL) syrup.   oz (14 g) jelly.  1 slice of bread.  1 six-inch tortilla.  3 oz (85 g) cooked rice or pasta.  4 oz (113 g) cooked dried beans.  4 oz (113 g) starchy vegetable, such as peas, corn, or potatoes.  4 oz (113 g) hot cereal.  4 oz (113 g) mashed potatoes or  of a large baked potato.  4 oz (113 g) canned or frozen fruit.  4 oz (120 mL) fruit juice.  4-6 crackers.  6 chicken nuggets.  6 oz (170 g) unsweetened dry cereal.  6 oz (170 g) plain fat-free yogurt or yogurt sweetened with artificial sweeteners.  8 oz (240 mL) milk.  8 oz (170 g) fresh fruit or one small piece of fruit.  24 oz (680 g) popped popcorn. Example of carbohydrate counting Sample meal  3 oz (85 g) chicken breast.  6 oz (170 g)   brown rice.  4 oz (113 g) corn.  8 oz (240 mL) milk.  8 oz (170 g) strawberries with sugar-free whipped topping. Carbohydrate calculation 1. Identify the foods that contain carbohydrates: ? Rice. ? Corn. ? Milk. ? Strawberries. 2. Calculate how many servings you have of each food: ? 2 servings rice. ? 1 serving corn. ? 1 serving milk. ? 1 serving strawberries. 3. Multiply each number of servings by 15 g: ? 2 servings rice x 15 g = 30 g. ? 1 serving corn x 15 g = 15 g. ? 1 serving milk x 15 g = 15 g. ? 1 serving  strawberries x 15 g = 15 g. 4. Add together all of the amounts to find the total grams of carbohydrates eaten: ? 30 g + 15 g + 15 g + 15 g = 75 g of carbohydrates total. Summary  Carbohydrate counting is a method of keeping track of how many carbohydrates you eat.  Eating carbohydrates naturally increases the amount of sugar (glucose) in the blood.  Counting how many carbohydrates you eat helps keep your blood glucose within normal limits, which helps you manage your diabetes.  A diet and nutrition specialist (registered dietitian) can help you make a meal plan and calculate how many carbohydrates you should have at each meal and snack. This information is not intended to replace advice given to you by your health care provider. Make sure you discuss any questions you have with your health care provider. Document Released: 07/25/2005 Document Revised: 02/16/2017 Document Reviewed: 01/06/2016 Elsevier Patient Education  2020 Elsevier Inc.  

## 2019-07-10 LAB — COMPREHENSIVE METABOLIC PANEL
ALT: 19 IU/L (ref 0–32)
AST: 20 IU/L (ref 0–40)
Albumin/Globulin Ratio: 2.1 (ref 1.2–2.2)
Albumin: 4.9 g/dL — ABNORMAL HIGH (ref 3.8–4.8)
Alkaline Phosphatase: 84 IU/L (ref 39–117)
BUN/Creatinine Ratio: 19 (ref 9–23)
BUN: 12 mg/dL (ref 6–24)
Bilirubin Total: 0.3 mg/dL (ref 0.0–1.2)
CO2: 25 mmol/L (ref 20–29)
Calcium: 9.8 mg/dL (ref 8.7–10.2)
Chloride: 104 mmol/L (ref 96–106)
Creatinine, Ser: 0.64 mg/dL (ref 0.57–1.00)
GFR calc Af Amer: 121 mL/min/{1.73_m2} (ref >59)
GFR calc non Af Amer: 105 mL/min/{1.73_m2} (ref >59)
Globulin, Total: 2.3 g/dL (ref 1.5–4.5)
Glucose: 89 mg/dL (ref 65–99)
Potassium: 4.6 mmol/L (ref 3.5–5.2)
Sodium: 143 mmol/L (ref 134–144)
Total Protein: 7.2 g/dL (ref 6.0–8.5)

## 2019-07-10 LAB — LIPID PANEL W/O CHOL/HDL RATIO
Cholesterol, Total: 150 mg/dL (ref 100–199)
HDL: 74 mg/dL (ref >39)
LDL Chol Calc (NIH): 66 mg/dL (ref 0–99)
Triglycerides: 47 mg/dL (ref 0–149)
VLDL Cholesterol Cal: 10 mg/dL (ref 5–40)

## 2019-08-16 DIAGNOSIS — Z4789 Encounter for other orthopedic aftercare: Secondary | ICD-10-CM | POA: Insufficient documentation

## 2019-10-21 ENCOUNTER — Other Ambulatory Visit: Payer: Self-pay

## 2019-10-21 MED ORDER — PEN NEEDLES 31G X 8 MM MISC: 1.0000 | Freq: Every day | 11 refills | Status: DC

## 2019-10-21 NOTE — Telephone Encounter (Signed)
LOV: 07/09/2019, NOV: 01/10/2020.

## 2019-12-16 ENCOUNTER — Other Ambulatory Visit: Payer: Self-pay | Admitting: Nurse Practitioner

## 2019-12-16 NOTE — Progress Notes (Signed)
Error

## 2019-12-16 NOTE — Telephone Encounter (Signed)
Can you please call patient and see how much she is taking.  Current script in system is confusing.

## 2019-12-16 NOTE — Telephone Encounter (Signed)
LOV: 07/09/2019; NOV: 01/10/2020.  Last filled 06/26/2019.

## 2019-12-16 NOTE — Telephone Encounter (Signed)
Requested medication (s) are due for refill today:yes  Requested medication (s) are on the active medication list: yes  Last refill:  11/19/2019  Future visit scheduled: yes  Notes to clinic:  last filled by a historical provider    Requested Prescriptions  Pending Prescriptions Disp Refills   SAXENDA 18 MG/3ML SOPN [Pharmacy Med Name: SAXENDA 18 MG/3 ML PEN]  6    Sig: INJECT 0.8 MG INTO THE SKIN ONCE A WEEK FOR 7 DAYS, THEN 1.6 MG ONCE A WEEK FOR 7 DAYS.      Endocrinology:  Diabetes - GLP-1 Receptor Agonists Passed - 12/16/2019  1:29 AM      Passed - HBA1C is between 0 and 7.9 and within 180 days    HB A1C (BAYER DCA - WAIVED)  Date Value Ref Range Status  07/09/2019 5.3 <7.0 % Final    Comment:                                          Diabetic Adult            <7.0                                       Healthy Adult        4.3 - 5.7                                                           (DCCT/NGSP) American Diabetes Association's Summary of Glycemic Recommendations for Adults with Diabetes: Hemoglobin A1c <7.0%. More stringent glycemic goals (A1c <6.0%) may further reduce complications at the cost of increased risk of hypoglycemia.           Passed - Valid encounter within last 6 months    Recent Outpatient Visits           5 months ago Controlled type 2 diabetes with neuropathy (HCC)   [facility] Mor[facility] Cannady, Dorie Rank, NP   11 months ago Encounter for annual health examination   [facility], Corrie Dandy T, NP   11 months ago PE (physical exam), annual   Holly Morales Holly, Redge Gainer, MD   1 year ago Diabetes mellitus without complication [facility]   Holly Morales Holly, Redge Gainer, MD   1 year ago Essential hypertension   Holly Morales Holly, Redge Gainer, MD       Future Appointments             In 3 weeks Cannady, Dorie Rank, NP [facility]

## 2019-12-16 NOTE — Telephone Encounter (Signed)
Patient is taking 2.4mg  daily

## 2020-01-10 ENCOUNTER — Ambulatory Visit (INDEPENDENT_AMBULATORY_CARE_PROVIDER_SITE_OTHER): Payer: BC Managed Care – PPO | Admitting: Nurse Practitioner

## 2020-01-10 ENCOUNTER — Other Ambulatory Visit: Payer: Self-pay

## 2020-01-10 ENCOUNTER — Encounter: Payer: Self-pay | Admitting: Nurse Practitioner

## 2020-01-10 VITALS — BP 122/82 | HR 85 | Temp 98.4°F | Ht 65.0 in | Wt 165.0 lb

## 2020-01-10 DIAGNOSIS — E1169 Type 2 diabetes mellitus with other specified complication: Secondary | ICD-10-CM | POA: Diagnosis not present

## 2020-01-10 DIAGNOSIS — F17210 Nicotine dependence, cigarettes, uncomplicated: Secondary | ICD-10-CM

## 2020-01-10 DIAGNOSIS — E785 Hyperlipidemia, unspecified: Secondary | ICD-10-CM

## 2020-01-10 DIAGNOSIS — Z5320 Procedure and treatment not carried out because of patient's decision for unspecified reasons: Secondary | ICD-10-CM

## 2020-01-10 DIAGNOSIS — E1140 Type 2 diabetes mellitus with diabetic neuropathy, unspecified: Secondary | ICD-10-CM | POA: Diagnosis not present

## 2020-01-10 DIAGNOSIS — Z1212 Encounter for screening for malignant neoplasm of rectum: Secondary | ICD-10-CM | POA: Diagnosis not present

## 2020-01-10 DIAGNOSIS — Z1211 Encounter for screening for malignant neoplasm of colon: Secondary | ICD-10-CM

## 2020-01-10 DIAGNOSIS — M8949 Other hypertrophic osteoarthropathy, multiple sites: Secondary | ICD-10-CM | POA: Diagnosis not present

## 2020-01-10 DIAGNOSIS — E1159 Type 2 diabetes mellitus with other circulatory complications: Secondary | ICD-10-CM | POA: Diagnosis not present

## 2020-01-10 DIAGNOSIS — Z6827 Body mass index (BMI) 27.0-27.9, adult: Secondary | ICD-10-CM

## 2020-01-10 DIAGNOSIS — M159 Polyosteoarthritis, unspecified: Secondary | ICD-10-CM

## 2020-01-10 DIAGNOSIS — Z0000 Encounter for general adult medical examination without abnormal findings: Secondary | ICD-10-CM

## 2020-01-10 DIAGNOSIS — I10 Essential (primary) hypertension: Secondary | ICD-10-CM

## 2020-01-10 LAB — MICROALBUMIN, URINE WAIVED
Creatinine, Urine Waived: 50 mg/dL (ref 10–300)
Microalb, Ur Waived: 10 mg/L (ref 0–19)
Microalb/Creat Ratio: <30 mg/g (ref <30)

## 2020-01-10 LAB — BAYER DCA HB A1C WAIVED: HB A1C (BAYER DCA - WAIVED): 5.6 % (ref <7.0)

## 2020-01-10 MED ORDER — METFORMIN HCL 500 MG PO TABS: 500.0000 mg | ORAL_TABLET | Freq: Every day | ORAL | 4 refills | Status: DC

## 2020-01-10 MED ORDER — IBUPROFEN 800 MG PO TABS: 800.0000 mg | ORAL_TABLET | Freq: Three times a day (TID) | ORAL | 5 refills | Status: DC | PRN

## 2020-01-10 MED ORDER — CYCLOBENZAPRINE HCL 5 MG PO TABS: 5.0000 mg | ORAL_TABLET | Freq: Three times a day (TID) | ORAL | 2 refills | Status: DC | PRN

## 2020-01-10 MED ORDER — SIMVASTATIN 40 MG PO TABS: 40.0000 mg | ORAL_TABLET | Freq: Every day | ORAL | 4 refills | Status: DC

## 2020-01-10 MED ORDER — LISINOPRIL 10 MG PO TABS: 10.0000 mg | ORAL_TABLET | Freq: Every day | ORAL | 4 refills | Status: DC

## 2020-01-10 NOTE — Assessment & Plan Note (Signed)
I have recommended complete cessation of tobacco use. I have discussed various options available for assistance with tobacco cessation including over the counter methods (Nicotine gum, patch and lozenges). We also discussed prescription options (Chantix, Nicotine Inhaler / Nasal Spray). The patient is not interested in pursuing any prescription tobacco cessation options at this time.  Provided information on CT screening to patient.

## 2020-01-10 NOTE — Assessment & Plan Note (Signed)
Chronic, stable with BP at goal in office today.  Continue Lisinopril, which offers kidney protection.  Urine micro ALB 10 and A:C <30 today.  Recommend she check BP at home at least 3 mornings a week and focus on DASH diet.  Obtain CMP today.  Return in 3 months.

## 2020-01-10 NOTE — Assessment & Plan Note (Signed)
Continue to collaborate with orthopedics.

## 2020-01-10 NOTE — Assessment & Plan Note (Signed)
Recommended eating smaller high protein, low fat meals more frequently and exercising 30 mins a day 5 times a week with a goal of 10-15lb weight loss in the next 3 months. Patient voiced their understanding and motivation to adhere to these recommendations.  

## 2020-01-10 NOTE — Patient Instructions (Signed)

## 2020-01-10 NOTE — Assessment & Plan Note (Addendum)
Chronic, stable with current A1C 5.6% with urine ALB 10.  Continue current medication regimen and continue reduction of medications at next visit if remains stable and has continued weight loss.  Recommend she continue to monitor BS at home with goal <130 in morning and <180 two hours after a meal.  At this time she wishes to maintain regimen due to some weight gain with quitting smoking.  Return in 6 months as A1C has been well-controlled for well over a year. Refills sent.

## 2020-01-10 NOTE — Assessment & Plan Note (Addendum)
Chronic, stable.  Continue current medication regimen and adjust as needed.  Lipid panel and CMP today.  Refills sent.

## 2020-01-10 NOTE — Progress Notes (Signed)
BP 122/82 (BP Location: Left Arm, Patient Position: Sitting, Cuff Size: Normal)    Pulse 85    Temp 98.4 F (36.9 C) (Oral)    LMP 11/06/2017 (Approximate)    SpO2 97%    Subjective:    Patient ID: Holly Morales, female    DOB: 1970/12/23, 49 y.o.   MRN: [id number]  HPI: Holly Morales is a 49 y.o. female presenting on 01/10/2020 for comprehensive medical examination. Current medical complaints include:none  She currently lives with: self Menopausal Symptoms: no   DIABETES Recent A1C 5.3% in December -- had remained below goal for over a year, Continues on Metformin 500 MG QDAY &Saxenda 0.8 MG weekly.  Takes occasional Valtrex for "fever blister". Hypoglycemic episodes:no Polydipsia/polyuria:no Visual disturbance:no Chest pain:no Paresthesias:no Glucose Monitoring:yes Accucheck frequency: every other day Fasting glucose: 125-130 in the morning Post prandial: Evening: Before meals: Taking Insulin?:no Long acting insulin: Short acting insulin: Blood Pressure Monitoring:not checking Retinal Examination:Not Up To Date Foot Exam:Up to Date Pneumovax:Up to Date Influenza:Up to Date Aspirin:no, is going to start taking  HYPERTENSION / HYPERLIPIDEMIA Continues on Simvastatin for HLD and Lisinopril for HTN.  At last visit had quit smoking, but reports this lasted only 40 days and she returned to smoking.  Smokes 1/2 PPD, has smoked for 14 years.   Satisfied with current treatment?yes Duration of hypertension:chronic BP monitoring frequency:not checking BP range:  BP medication side effects:no Duration of hyperlipidemia:chronic Cholesterol medication side effects:no Cholesterol supplements: none Medication compliance:good compliance Aspirin:no Recent stressors:no Recurrent headaches:no Visual changes:no Palpitations:no Dyspnea:no Chest pain:no Lower extremity  edema:no Dizzy/lightheaded:no  OSTEOARTHRITIS: She is currently being followed by [facility] and has had imaging which she reports showed some scoliosis.  Continues on Flexeril as needed.    Depression Screen done today and results listed below:  Depression screen [facility] 2/9 01/10/2020 01/07/2019 09/11/2018 03/01/2018 11/30/2017  Decreased Interest 0 0 0 0 0  Down, Depressed, Hopeless 0 0 0 0 0  PHQ - 2 Score 0 0 0 0 0  Altered sleeping 0 0 2 - -  Tired, decreased energy 1 0 2 - -  Change in appetite 0 0 0 - -  Feeling bad or failure about yourself  0 0 0 - -  Trouble concentrating 1 0 0 - -  Moving slowly or fidgety/restless 0 0 0 - -  Suicidal thoughts 0 0 0 - -  PHQ-9 Score 2 0 4 - -  Difficult doing work/chores Not difficult at all Not difficult at all - - -    The patient does not have a history of falls. I did not complete a risk assessment for falls. A plan of care for falls was not documented.   Past Medical History:  Past Medical History:  Diagnosis Date   Depression    06/25/18 no longer an issue per pt    Diabetes mellitus without complication (HCC)    High cholesterol    Obesity (BMI 30-39.9)     Surgical History:  Past Surgical History:  Procedure Laterality Date   LEEP  2009   NO PAST SURGERIES      Medications:  Current Outpatient Medications on File Prior to Visit  Medication Sig   glucose blood (ONE TOUCH ULTRA TEST) test strip USE TO TEST BLOOD SUGAR DAILY   Insulin Pen Needle (PEN NEEDLES) 31G X 8 MM MISC 1 each by Does not apply route daily. Use one needle daily to inject Saxenda   Liraglutide -Weight Management (  SAXENDA) 18 MG/3ML SOPN Inject 0.4 mLs (2.4 mg total) into the skin daily.   ONETOUCH DELICA LANCETS 56O MISC USE TO TEST BLOOD SUGAR DAILY   valACYclovir (VALTREX) 1000 MG tablet TAKE 1 TABLET BY MOUTH TWICE A DAY   No current facility-administered medications on file prior to visit.    Allergies:  Allergies  Allergen Reactions     Jardiance [Empagliflozin]     Candidiasis    Social History:  Social History   Socioeconomic History   Marital status: Divorced    Spouse name: Not on file   Number of children: 0   Years of education: Not on file   Highest education level: High school graduate  Occupational History   Not on file  Tobacco Use   Smoking status: Former Smoker    Packs/day: 0.50    Types: Cigarettes    Quit date: 07/06/2019    Years since quitting: 0.5   Smokeless tobacco: Never Used  Substance and Sexual Activity   Alcohol use: Yes    Alcohol/week: 1.0 standard drinks    Types: 1 Standard drinks or equivalent per week    Comment: on occasion   Drug use: No    Comment: 06/25/18--30 yrs ago smoked marijuana   Sexual activity: Not on file  Other Topics Concern   Not on file  Social History Narrative   Lives at home alone   Right handed   Caffeine: 1 pot of coffee every morning, 1 cup of unsweet tea daily, lots of water, very seldom soda.   Social Determinants of Health   Financial Resource Strain:    Difficulty of Paying Living Expenses:   Food Insecurity:    Worried About Charity fundraiser in the Last Year:    Arboriculturist in the Last Year:   Transportation Needs:    Film/video editor (Medical):    Lack of Transportation (Non-Medical):   Physical Activity:    Days of Exercise per Week:    Minutes of Exercise per Session:   Stress:    Feeling of Stress :   Social Connections:    Frequency of Communication with Friends and Family:    Frequency of Social Gatherings with Friends and Family:    Attends Religious Services:    Active Member of Clubs or Organizations:    Attends Music therapist:    Marital Status:   Intimate Partner Violence:    Fear of Current or Ex-Partner:    Emotionally Abused:    Physically Abused:    Sexually Abused:    Social History   Tobacco Use  Smoking Status Former Smoker   Packs/day: 0.50    Types: Cigarettes   Quit date: 07/06/2019   Years since quitting: 0.5  Smokeless Tobacco Never Used   Social History   Substance and Sexual Activity  Alcohol Use Yes   Alcohol/week: 1.0 standard drinks   Types: 1 Standard drinks or equivalent per week   Comment: on occasion    Family History:  Family History  Problem Relation Age of Onset   Diabetes Father    Cancer Father        Prostate   Irregular heart beat Father    Breast cancer Neg Hx     Past medical history, surgical history, medications, allergies, family history and social history reviewed with patient today and changes made to appropriate areas of the chart.   Review of Systems - negative All other ROS negative except  what is listed above and in the HPI.      Objective:    BP 122/82 (BP Location: Left Arm, Patient Position: Sitting, Cuff Size: Normal)    Pulse 85    Temp 98.4 F (36.9 C) (Oral)    LMP 11/06/2017 (Approximate)    SpO2 97%   Wt Readings from Last 3 Encounters:  07/09/19 169 lb (76.7 kg)  01/07/19 165 lb (74.8 kg)  01/02/19 160 lb (72.6 kg)    Physical Exam Constitutional:      General: She is awake. She is not in acute distress.    Appearance: She is well-developed. She is not ill-appearing.  HENT:     Head: Normocephalic and atraumatic.     Right Ear: Hearing, tympanic membrane, ear canal and external ear normal. No drainage.     Left Ear: Hearing, tympanic membrane, ear canal and external ear normal. No drainage.     Nose: Nose normal.     Right Sinus: No maxillary sinus tenderness or frontal sinus tenderness.     Left Sinus: No maxillary sinus tenderness or frontal sinus tenderness.     Mouth/Throat:     Mouth: Mucous membranes are moist.     Pharynx: Oropharynx is clear. Uvula midline. No pharyngeal swelling, oropharyngeal exudate or posterior oropharyngeal erythema.  Eyes:     General: Lids are normal.        Right eye: No discharge.        Left eye: No discharge.      Extraocular Movements: Extraocular movements intact.     Conjunctiva/sclera: Conjunctivae normal.     Pupils: Pupils are equal, round, and reactive to light.     Visual Fields: Right eye visual fields normal and left eye visual fields normal.  Neck:     Thyroid: No thyromegaly.     Vascular: No carotid bruit.     Trachea: Trachea normal.  Cardiovascular:     Rate and Rhythm: Normal rate and regular rhythm.     Heart sounds: Normal heart sounds. No murmur. No gallop.   Pulmonary:     Effort: Pulmonary effort is normal. No accessory muscle usage or respiratory distress.     Breath sounds: Normal breath sounds.  Chest:     Breasts:        Right: Normal.        Left: Normal.  Abdominal:     General: Bowel sounds are normal.     Palpations: Abdomen is soft. There is no hepatomegaly or splenomegaly.     Tenderness: There is no abdominal tenderness.  Musculoskeletal:        General: Normal range of motion.     Cervical back: Normal range of motion and neck supple.     Right lower leg: No edema.     Left lower leg: No edema.  Lymphadenopathy:     Head:     Right side of head: No submental, submandibular, tonsillar, preauricular or posterior auricular adenopathy.     Left side of head: No submental, submandibular, tonsillar, preauricular or posterior auricular adenopathy.     Cervical: No cervical adenopathy.     Upper Body:     Right upper body: No supraclavicular, axillary or pectoral adenopathy.     Left upper body: No supraclavicular, axillary or pectoral adenopathy.  Skin:    General: Skin is warm and dry.     Capillary Refill: Capillary refill takes less than 2 seconds.     Findings: No rash.  Neurological:     Mental Status: She is alert and oriented to person, place, and time.     Cranial Nerves: Cranial nerves are intact.     Gait: Gait is intact.     Deep Tendon Reflexes: Reflexes are normal and symmetric.     Reflex Scores:      Brachioradialis reflexes are 2+ on the  right side and 2+ on the left side.      Patellar reflexes are 2+ on the right side and 2+ on the left side. Psychiatric:        Attention and Perception: Attention normal.        Mood and Affect: Mood normal.        Speech: Speech normal.        Behavior: Behavior normal. Behavior is cooperative.        Thought Content: Thought content normal.        Judgment: Judgment normal.    Diabetic Foot Exam - Simple   Simple Foot Form Visual Inspection No deformities, no ulcerations, no other skin breakdown bilaterally: Yes Sensation Testing Intact to touch and monofilament testing bilaterally: Yes Pulse Check Posterior Tibialis and Dorsalis pulse intact bilaterally: Yes Comments    Results for orders placed or performed in visit on 07/09/19  Bayer DCA Hb A1c Waived  Result Value Ref Range   HB A1C (BAYER DCA - WAIVED) 5.3 <7.0 %  Microalbumin, Urine Waived here  Result Value Ref Range   Microalb, Ur Waived 10 0 - 19 mg/L   Creatinine, Urine Waived 10 10 - 300 mg/dL   Microalb/Creat Ratio <30 <30 mg/g  Comprehensive metabolic panel  Result Value Ref Range   Glucose 89 65 - 99 mg/dL   BUN 12 6 - 24 mg/dL   Creatinine, Ser 1.610.64 0.57 - 1.00 mg/dL   GFR calc non Af Amer 105 >59 mL/min/1.73   GFR calc Af Amer 121 >59 mL/min/1.73   BUN/Creatinine Ratio 19 9 - 23   Sodium 143 134 - 144 mmol/L   Potassium 4.6 3.5 - 5.2 mmol/L   Chloride 104 96 - 106 mmol/L   CO2 25 20 - 29 mmol/L   Calcium 9.8 8.7 - 10.2 mg/dL   Total Protein 7.2 6.0 - 8.5 g/dL   Albumin 4.9 (H) 3.8 - 4.8 g/dL   Globulin, Total 2.3 1.5 - 4.5 g/dL   Albumin/Globulin Ratio 2.1 1.2 - 2.2   Bilirubin Total 0.3 0.0 - 1.2 mg/dL   Alkaline Phosphatase 84 39 - 117 IU/L   AST 20 0 - 40 IU/L   ALT 19 0 - 32 IU/L  Lipid Panel w/o Chol/HDL Ratio out  Result Value Ref Range   Cholesterol, Total 150 100 - 199 mg/dL   Triglycerides 47 0 - 149 mg/dL   HDL 74 >09>39 mg/dL   VLDL Cholesterol Cal 10 5 - 40 mg/dL   LDL Chol Calc  (NIH) 66 0 - 99 mg/dL      Assessment & Plan:   Problem List Items Addressed This Visit      Cardiovascular and Mediastinum   Hypertension associated with diabetes (HCC)    Chronic, stable with BP at goal in office today.  Continue Lisinopril, which offers kidney protection.  Urine micro ALB 10 and A:C <30 today.  Recommend she check BP at home at least 3 mornings a week and focus on DASH diet.  Obtain CMP today.  Return in 3 months.      Relevant Medications  simvastatin (ZOCOR) 40 MG tablet   lisinopril (ZESTRIL) 10 MG tablet   metFORMIN (GLUCOPHAGE) 500 MG tablet   Other Relevant Orders   Bayer DCA Hb A1c Waived   Comprehensive metabolic panel   TSH   Microalbumin, Urine Waived     Endocrine   Controlled type 2 diabetes with neuropathy (HCC)    Chronic, stable with current A1C 5.6% with urine ALB 10.  Continue current medication regimen and continue reduction of medications at next visit if remains stable and has continued weight loss.  Recommend she continue to monitor BS at home with goal <130 in morning and <180 two hours after a meal.  At this time she wishes to maintain regimen due to some weight gain with quitting smoking.  Return in 6 months as A1C has been well-controlled for well over a year. Refills sent.      Relevant Medications   simvastatin (ZOCOR) 40 MG tablet   lisinopril (ZESTRIL) 10 MG tablet   metFORMIN (GLUCOPHAGE) 500 MG tablet   Other Relevant Orders   CBC with Differential/Platelet   Comprehensive metabolic panel   Microalbumin, Urine Waived   Hyperlipidemia associated with type 2 diabetes mellitus (HCC)    Chronic, stable.  Continue current medication regimen and adjust as needed.  Lipid panel and CMP today.  Refills sent.      Relevant Medications   simvastatin (ZOCOR) 40 MG tablet   lisinopril (ZESTRIL) 10 MG tablet   metFORMIN (GLUCOPHAGE) 500 MG tablet   Other Relevant Orders   Lipid Panel w/o Chol/HDL Ratio     Musculoskeletal and  Integument   Osteoarthritis    Continue to collaborate with orthopedics.      Relevant Medications   cyclobenzaprine (FLEXERIL) 5 MG tablet   ibuprofen (ADVIL) 800 MG tablet     Other   BMI 27.0-27.9,adult    Recommended eating smaller high protein, low fat meals more frequently and exercising 30 mins a day 5 times a week with a goal of 10-15lb weight loss in the next 3 months. Patient voiced their understanding and motivation to adhere to these recommendations.       Nicotine dependence, cigarettes, uncomplicated    I have recommended complete cessation of tobacco use. I have discussed various options available for assistance with tobacco cessation including over the counter methods (Nicotine gum, patch and lozenges). We also discussed prescription options (Chantix, Nicotine Inhaler / Nasal Spray). The patient is not interested in pursuing any prescription tobacco cessation options at this time.  Provided information on CT screening to patient.       Other Visit Diagnoses    Encounter for annual physical exam    -  Primary   HIV screening declined       Relevant Orders   HIV Antibody (routine testing w rflx)   Encounter for colorectal cancer screening       Relevant Orders   Cologuard       Follow up plan: Return in about 6 months (around 07/11/2020) for T2DM, HTN/HLD.   LABORATORY TESTING:  - Pap smear: pap done -- done with [facility] last year  IMMUNIZATIONS:   - Tdap: Tetanus vaccination status reviewed: last tetanus booster within 10 years. - Influenza: Up to date - Pneumovax: Up to date - Prevnar: Not applicable - HPV: Not applicable - Zostavax vaccine: Refused  SCREENING: -Mammogram: Up to date  - Colonoscopy: Ordered today  - Bone Density: Not applicable  -Hearing Test: Not applicable  -Spirometry: Not  applicable   PATIENT COUNSELING:   Advised to take 1 mg of folate supplement per day if capable of pregnancy.   Sexuality: Discussed sexually  transmitted diseases, partner selection, use of condoms, avoidance of unintended pregnancy  and contraceptive alternatives.   Advised to avoid cigarette smoking.  I discussed with the patient that most people either abstain from alcohol or drink within safe limits (<=14/week and <=4 drinks/occasion for males, <=7/weeks and <= 3 drinks/occasion for females) and that the risk for alcohol disorders and other health effects rises proportionally with the number of drinks per week and how often a drinker exceeds daily limits.  Discussed cessation/primary prevention of drug use and availability of treatment for abuse.   Diet: Encouraged to adjust caloric intake to maintain  or achieve ideal body weight, to reduce intake of dietary saturated fat and total fat, to limit sodium intake by avoiding high sodium foods and not adding table salt, and to maintain adequate dietary potassium and calcium preferably from fresh fruits, vegetables, and low-fat dairy products.    stressed the importance of regular exercise  Injury prevention: Discussed safety belts, safety helmets, smoke detector, smoking near bedding or upholstery.   Dental health: Discussed importance of regular tooth brushing, flossing, and dental visits.    NEXT PREVENTATIVE PHYSICAL DUE IN 1 YEAR. Return in about 6 months (around 07/11/2020) for T2DM, HTN/HLD.

## 2020-01-11 LAB — LIPID PANEL W/O CHOL/HDL RATIO
Cholesterol, Total: 137 mg/dL (ref 100–199)
HDL: 66 mg/dL (ref >39)
LDL Chol Calc (NIH): 59 mg/dL (ref 0–99)
Triglycerides: 54 mg/dL (ref 0–149)
VLDL Cholesterol Cal: 12 mg/dL (ref 5–40)

## 2020-01-11 LAB — COMPREHENSIVE METABOLIC PANEL
ALT: 26 IU/L (ref 0–32)
AST: 27 IU/L (ref 0–40)
Albumin/Globulin Ratio: 1.8 (ref 1.2–2.2)
Albumin: 4.5 g/dL (ref 3.8–4.8)
Alkaline Phosphatase: 90 IU/L (ref 48–121)
BUN/Creatinine Ratio: 13 (ref 9–23)
BUN: 8 mg/dL (ref 6–24)
Bilirubin Total: 0.2 mg/dL (ref 0.0–1.2)
CO2: 26 mmol/L (ref 20–29)
Calcium: 9.6 mg/dL (ref 8.7–10.2)
Chloride: 99 mmol/L (ref 96–106)
Creatinine, Ser: 0.61 mg/dL (ref 0.57–1.00)
GFR calc Af Amer: 122 mL/min/{1.73_m2} (ref >59)
GFR calc non Af Amer: 106 mL/min/{1.73_m2} (ref >59)
Globulin, Total: 2.5 g/dL (ref 1.5–4.5)
Glucose: 90 mg/dL (ref 65–99)
Potassium: 4.4 mmol/L (ref 3.5–5.2)
Sodium: 140 mmol/L (ref 134–144)
Total Protein: 7 g/dL (ref 6.0–8.5)

## 2020-01-11 LAB — CBC WITH DIFFERENTIAL/PLATELET
Basophils Absolute: 0.1 10*3/uL (ref 0.0–0.2)
Basos: 1 %
EOS (ABSOLUTE): 0.3 10*3/uL (ref 0.0–0.4)
Eos: 5 %
Hematocrit: 40.8 % (ref 34.0–46.6)
Hemoglobin: 13.4 g/dL (ref 11.1–15.9)
Immature Grans (Abs): 0 10*3/uL (ref 0.0–0.1)
Immature Granulocytes: 1 %
Lymphocytes Absolute: 1.3 10*3/uL (ref 0.7–3.1)
Lymphs: 18 %
MCH: 33.6 pg — ABNORMAL HIGH (ref 26.6–33.0)
MCHC: 32.8 g/dL (ref 31.5–35.7)
MCV: 102 fL — ABNORMAL HIGH (ref 79–97)
Monocytes Absolute: 0.6 10*3/uL (ref 0.1–0.9)
Monocytes: 8 %
Neutrophils Absolute: 4.9 10*3/uL (ref 1.4–7.0)
Neutrophils: 67 %
Platelets: 287 10*3/uL (ref 150–450)
RBC: 3.99 x10E6/uL (ref 3.77–5.28)
RDW: 12.3 % (ref 11.7–15.4)
WBC: 7.2 10*3/uL (ref 3.4–10.8)

## 2020-01-11 LAB — TSH: TSH: 1.92 u[IU]/mL (ref 0.450–4.500)

## 2020-01-11 LAB — HIV ANTIBODY (ROUTINE TESTING W REFLEX): HIV Screen 4th Generation wRfx: NONREACTIVE

## 2020-01-12 NOTE — Progress Notes (Signed)
Please let Holly Morales know labs have returned and HIV testing is negative.  CBC shows no anemia.  Kidney and liver function + thyroid are normal.  Cholesterol levels look great!!  Continue all of your current medications and have a wonderful day!!

## 2020-01-14 ENCOUNTER — Telehealth: Payer: Self-pay

## 2020-01-14 ENCOUNTER — Other Ambulatory Visit: Payer: Self-pay | Admitting: Nurse Practitioner

## 2020-01-14 DIAGNOSIS — Z1231 Encounter for screening mammogram for malignant neoplasm of breast: Secondary | ICD-10-CM

## 2020-01-14 NOTE — Telephone Encounter (Signed)
Prior Authorization initiated via CoverMyMeds for Saxenda Key: [facility]

## 2020-01-15 NOTE — Telephone Encounter (Signed)
PA Approved

## 2020-01-22 ENCOUNTER — Encounter: Payer: Self-pay | Admitting: Nurse Practitioner

## 2020-01-22 ENCOUNTER — Ambulatory Visit (INDEPENDENT_AMBULATORY_CARE_PROVIDER_SITE_OTHER): Payer: BC Managed Care – PPO | Admitting: Nurse Practitioner

## 2020-01-22 ENCOUNTER — Telehealth: Payer: Self-pay | Admitting: Nurse Practitioner

## 2020-01-22 DIAGNOSIS — J0100 Acute maxillary sinusitis, unspecified: Secondary | ICD-10-CM | POA: Diagnosis not present

## 2020-01-22 MED ORDER — PREDNISONE 20 MG PO TABS
40.0000 mg | ORAL_TABLET | Freq: Every day | ORAL | 0 refills | Status: AC
Start: 2020-01-22 — End: 2020-01-27

## 2020-01-22 MED ORDER — AMOXICILLIN-POT CLAVULANATE 875-125 MG PO TABS: 1.0000 | ORAL_TABLET | Freq: Two times a day (BID) | ORAL | 0 refills | Status: AC

## 2020-01-22 NOTE — Patient Instructions (Signed)

## 2020-01-22 NOTE — Telephone Encounter (Signed)
Will need visit, which we can do virtually.  I have a 1035 tomorrow.

## 2020-01-22 NOTE — Telephone Encounter (Signed)
Copied from CRM [id number]. Topic: General - Other >> Jan 22, 2020  8:32 AM Tamela Oddi wrote: Reason for CRM: Patient called to ask doctor if she could call in an antibiotic for her sinus issues she is still having since her visit on 6/4.  Patient states she is not getting any better and would like something called in.  Please advise and call patient to let her know if this is possible.  CB# [phone]

## 2020-01-22 NOTE — Assessment & Plan Note (Signed)
Acute with symptoms over 7 days.  She is obtaining Covid testing tomorrow, is vaccinated, aware to maintain self quarantine until symptoms improve. Will send scripts for Augmentin and Prednisone, is aware steroid may bump sugars up for a few days and to monitor closely.   - Increased rest - Increasing Fluids - Acetaminophen / ibuprofen as needed for fever/pain.  - Mucinex.  - Saline sinus flushes or a neti pot.  - Humidifying the air Return to office for worsening or ongoing symptoms.

## 2020-01-22 NOTE — Progress Notes (Signed)
LMP 11/06/2017 (Approximate)    Subjective:    Patient ID: Holly Morales, female    DOB: 21-Aug-1970, 49 y.o.   MRN: [id number]  HPI: Holly Morales is a 49 y.o. female  Chief Complaint  Patient presents with  . URI    pt states she has been having congestion, drainage, cough, and ear pain since visit on 01/10/20. Has taken Allegra D and Mucinex with no relief.    . This visit was completed via telephone due to the restrictions of the COVID-19 pandemic. All issues as above were discussed and addressed but no physical exam was performed. If it was felt that the patient should be evaluated in the office, they were directed there. The patient verbally consented to this visit. Patient was unable to complete an audio/visual visit due to Lack of equipment. Due to the catastrophic nature of the COVID-19 pandemic, this visit was done through audio contact only. . Location of the patient: home . Location of the provider: work . Those involved with this call:  . Provider: Aura Dials, DNP . CMA: Wilhemena Durie, CMA . Front Desk/Registration: Adela Ports  . Time spent on call: 20 minutes on the phone discussing health concerns. 15 minutes total spent in review of patient's record and preparation of their chart.  . I verified patient identity using two factors (patient name and date of birth). Patient consents verbally to being seen via telemedicine visit today.    UPPER RESPIRATORY TRACT INFECTION Started symptoms over a week ago, but no improvement.  Has taken Allegra D and Mucinex with some benefit, but not 100%.  Denies loss of taste or smell.  Has had Covid vaccines, last on April 14th.  She is getting Covid testing tomorrow on campus.   Fever: no Cough: yes Shortness of breath: no Wheezing: yes Chest pain: no Chest tightness: no Chest congestion: no Nasal congestion: no Runny nose: yes Post nasal drip: yes Sneezing: no Sore throat: no Swollen glands: no Sinus pressure:  yes Headache: yes Face pain: yes Toothache: no Ear pain: none Ear pressure: yes bilateral Eyes red/itching:no Eye drainage/crusting: no  Vomiting: no Rash: no Fatigue: yes Sick contacts: no Strep contacts: no  Context: stable Recurrent sinusitis: no Relief with OTC cold/cough medications: yes  Treatments attempted: cold/sinus and mucinex   Relevant past medical, surgical, family and social history reviewed and updated as indicated. Interim medical history since our last visit reviewed. Allergies and medications reviewed and updated.  Review of Systems  Constitutional: Positive for fatigue. Negative for activity change, appetite change, chills and fever.  HENT: Positive for postnasal drip, rhinorrhea and sinus pressure. Negative for congestion, ear discharge, ear pain, facial swelling, sinus pain, sneezing, sore throat and voice change.   Eyes: Negative for pain and visual disturbance.  Respiratory: Positive for cough and wheezing. Negative for chest tightness and shortness of breath.   Cardiovascular: Negative for chest pain, palpitations and leg swelling.  Gastrointestinal: Negative for abdominal distention, abdominal pain, constipation, diarrhea, nausea and vomiting.  Endocrine: Negative.   Musculoskeletal: Negative for myalgias.  Neurological: Positive for headaches. Negative for dizziness and numbness.  Psychiatric/Behavioral: Negative.     Per HPI unless specifically indicated above     Objective:    LMP 11/06/2017 (Approximate)   Wt Readings from Last 3 Encounters:  01/10/20 165 lb (74.8 kg)  07/09/19 169 lb (76.7 kg)  01/07/19 165 lb (74.8 kg)    Physical Exam   Unable to perform due to telephone  visit only -- reports sinus pain on palpation under eyes.  Results for orders placed or performed in visit on 01/10/20  HIV Antibody (routine testing w rflx)  Result Value Ref Range   HIV Screen 4th Generation wRfx Non Reactive Non Reactive  Bayer DCA Hb A1c Waived   Result Value Ref Range   HB A1C (BAYER DCA - WAIVED) 5.6 <7.0 %  CBC with Differential/Platelet  Result Value Ref Range   WBC 7.2 3.4 - 10.8 x10E3/uL   RBC 3.99 3.77 - 5.28 x10E6/uL   Hemoglobin 13.4 11.1 - 15.9 g/dL   Hematocrit 40.8 34.0 - 46.6 %   MCV 102 (H) 79 - 97 fL   MCH 33.6 (H) 26.6 - 33.0 pg   MCHC 32.8 31 - 35 g/dL   RDW 12.3 11.7 - 15.4 %   Platelets 287 150 - 450 x10E3/uL   Neutrophils 67 Not Estab. %   Lymphs 18 Not Estab. %   Monocytes 8 Not Estab. %   Eos 5 Not Estab. %   Basos 1 Not Estab. %   Neutrophils Absolute 4.9 1 - 7 x10E3/uL   Lymphocytes Absolute 1.3 0 - 3 x10E3/uL   Monocytes Absolute 0.6 0 - 0 x10E3/uL   EOS (ABSOLUTE) 0.3 0.0 - 0.4 x10E3/uL   Basophils Absolute 0.1 0 - 0 x10E3/uL   Immature Granulocytes 1 Not Estab. %   Immature Grans (Abs) 0.0 0.0 - 0.1 x10E3/uL  Comprehensive metabolic panel  Result Value Ref Range   Glucose 90 65 - 99 mg/dL   BUN 8 6 - 24 mg/dL   Creatinine, Ser 0.61 0.57 - 1.00 mg/dL   GFR calc non Af Amer 106 >59 mL/min/1.73   GFR calc Af Amer 122 >59 mL/min/1.73   BUN/Creatinine Ratio 13 9 - 23   Sodium 140 134 - 144 mmol/L   Potassium 4.4 3.5 - 5.2 mmol/L   Chloride 99 96 - 106 mmol/L   CO2 26 20 - 29 mmol/L   Calcium 9.6 8.7 - 10.2 mg/dL   Total Protein 7.0 6.0 - 8.5 g/dL   Albumin 4.5 3.8 - 4.8 g/dL   Globulin, Total 2.5 1.5 - 4.5 g/dL   Albumin/Globulin Ratio 1.8 1.2 - 2.2   Bilirubin Total 0.2 0.0 - 1.2 mg/dL   Alkaline Phosphatase 90 48 - 121 IU/L   AST 27 0 - 40 IU/L   ALT 26 0 - 32 IU/L  Lipid Panel w/o Chol/HDL Ratio  Result Value Ref Range   Cholesterol, Total 137 100 - 199 mg/dL   Triglycerides 54 0 - 149 mg/dL   HDL 66 >39 mg/dL   VLDL Cholesterol Cal 12 5 - 40 mg/dL   LDL Chol Calc (NIH) 59 0 - 99 mg/dL  TSH  Result Value Ref Range   TSH 1.920 0.450 - 4.500 uIU/mL  Microalbumin, Urine Waived  Result Value Ref Range   Microalb, Ur Waived 10 0 - 19 mg/L   Creatinine, Urine Waived 50 10 - 300  mg/dL   Microalb/Creat Ratio <30 <30 mg/g      Assessment & Plan:   Problem List Items Addressed This Visit      Respiratory   Acute non-recurrent maxillary sinusitis - Primary    Acute with symptoms over 7 days.  She is obtaining Covid testing tomorrow, is vaccinated, aware to maintain self quarantine until symptoms improve. Will send scripts for Augmentin and Prednisone, is aware steroid may bump sugars up for a few days and to  monitor closely.   - Increased rest - Increasing Fluids - Acetaminophen / ibuprofen as needed for fever/pain.  - Mucinex.  - Saline sinus flushes or a neti pot.  - Humidifying the air Return to office for worsening or ongoing symptoms.      Relevant Medications   amoxicillin-clavulanate (AUGMENTIN) 875-125 MG tablet   predniSONE (DELTASONE) 20 MG tablet     I discussed the assessment and treatment plan with the patient. The patient was provided an opportunity to ask questions and all were answered. The patient agreed with the plan and demonstrated an understanding of the instructions.   The patient was advised to call back or seek an in-person evaluation if the symptoms worsen or if the condition fails to improve as anticipated.   I provided 21+ minutes of time during this encounter.   Follow up plan: Return if symptoms worsen or fail to improve.

## 2020-01-31 ENCOUNTER — Ambulatory Visit
Admission: RE | Admit: 2020-01-31 | Discharge: 2020-01-31 | Disposition: A | Discharge status: Home / Self Care | Payer: BC Managed Care – PPO | Source: Ambulatory Visit | Attending: Nurse Practitioner | Admitting: Nurse Practitioner

## 2020-01-31 DIAGNOSIS — Z1231 Encounter for screening mammogram for malignant neoplasm of breast: Secondary | ICD-10-CM | POA: Diagnosis present

## 2020-04-17 ENCOUNTER — Encounter: Payer: Self-pay | Admitting: Nurse Practitioner

## 2020-04-17 ENCOUNTER — Ambulatory Visit (INDEPENDENT_AMBULATORY_CARE_PROVIDER_SITE_OTHER): Payer: BC Managed Care – PPO | Admitting: Nurse Practitioner

## 2020-04-17 ENCOUNTER — Other Ambulatory Visit: Payer: Self-pay

## 2020-04-17 VITALS — BP 117/71 | HR 83 | Temp 98.4°F | Wt 172.0 lb

## 2020-04-17 DIAGNOSIS — Z23 Encounter for immunization: Secondary | ICD-10-CM

## 2020-04-17 DIAGNOSIS — R8281 Pyuria: Secondary | ICD-10-CM | POA: Diagnosis not present

## 2020-04-17 MED ORDER — NITROFURANTOIN MONOHYD MACRO 100 MG PO CAPS
100.0000 mg | ORAL_CAPSULE | Freq: Two times a day (BID) | ORAL | 0 refills | Status: AC
Start: 2020-04-17 — End: 2020-04-22

## 2020-04-17 NOTE — Progress Notes (Signed)
BP 117/71    Pulse 83    Temp 98.4 F (36.9 C) (Oral)    Wt 172 lb (78 kg)    LMP 11/06/2017 (Approximate)    SpO2 97%    BMI 28.62 kg/m    Subjective:    Patient ID: Holly Morales, female    DOB: 1971-02-05, 49 y.o.   MRN: [id number]  HPI: Holly Morales is a 49 y.o. female  Chief Complaint  Patient presents with   Urinary Tract Infection    since Wednesday   Dysuria   Urinary Frequency   URINARY SYMPTOMS Symptoms present since Wednesday. Dysuria: burning Urinary frequency: yes Urgency: yes Small volume voids: yes Symptom severity: yes Urinary incontinence: no Foul odor: yes Hematuria: no Abdominal pain: no Back pain: no Suprapubic pain/pressure: no Flank pain: no Fever:  no Vomiting: nausea other morning Relief with cranberry juice: none Relief with pyridium: none Status: stable Previous urinary tract infection: never Recurrent urinary tract infection: no Sexual activity: monogamous History of sexually transmitted disease: no Treatments attempted: increasing fluids   Relevant past medical, surgical, family and social history reviewed and updated as indicated. Interim medical history since our last visit reviewed. Allergies and medications reviewed and updated.  Review of Systems  Constitutional: Negative for activity change, appetite change, diaphoresis, fatigue and fever.  Respiratory: Negative for cough, chest tightness, shortness of breath and wheezing.   Cardiovascular: Negative for chest pain, palpitations and leg swelling.  Gastrointestinal: Negative for abdominal distention, abdominal pain, constipation, diarrhea, nausea and vomiting.  Neurological: Negative.   Psychiatric/Behavioral: Negative.     Per HPI unless specifically indicated above     Objective:    BP 117/71    Pulse 83    Temp 98.4 F (36.9 C) (Oral)    Wt 172 lb (78 kg)    LMP 11/06/2017 (Approximate)    SpO2 97%    BMI 28.62 kg/m   Wt Readings from Last 3 Encounters:    04/17/20 172 lb (78 kg)  01/10/20 165 lb (74.8 kg)  07/09/19 169 lb (76.7 kg)    Physical Exam Vitals and nursing note reviewed.  Constitutional:      General: She is awake. She is not in acute distress.    Appearance: She is well-developed and overweight. She is not ill-appearing.  HENT:     Head: Normocephalic.     Right Ear: Hearing normal.     Left Ear: Hearing normal.  Eyes:     General: Lids are normal.        Right eye: No discharge.        Left eye: No discharge.     Conjunctiva/sclera: Conjunctivae normal.     Pupils: Pupils are equal, round, and reactive to light.  Neck:     Thyroid: No thyromegaly.     Vascular: No carotid bruit.  Cardiovascular:     Rate and Rhythm: Normal rate and regular rhythm.     Heart sounds: Normal heart sounds. No murmur heard.  No gallop.   Pulmonary:     Effort: Pulmonary effort is normal. No accessory muscle usage or respiratory distress.     Breath sounds: Normal breath sounds.  Abdominal:     General: Bowel sounds are normal.     Palpations: Abdomen is soft.     Tenderness: There is no abdominal tenderness. There is no right CVA tenderness or left CVA tenderness.  Musculoskeletal:     Cervical back: Normal range of motion and  neck supple.     Right lower leg: No edema.     Left lower leg: No edema.  Lymphadenopathy:     Cervical: No cervical adenopathy.  Skin:    General: Skin is warm and dry.  Neurological:     Mental Status: She is alert and oriented to person, place, and time.  Psychiatric:        Attention and Perception: Attention normal.        Mood and Affect: Mood normal.        Behavior: Behavior normal. Behavior is cooperative.        Thought Content: Thought content normal.        Judgment: Judgment normal.     Results for orders placed or performed in visit on 01/10/20  HIV Antibody (routine testing w rflx)  Result Value Ref Range   HIV Screen 4th Generation wRfx Non Reactive Non Reactive  Bayer DCA Hb A1c  Waived  Result Value Ref Range   HB A1C (BAYER DCA - WAIVED) 5.6 <7.0 %  CBC with Differential/Platelet  Result Value Ref Range   WBC 7.2 3.4 - 10.8 x10E3/uL   RBC 3.99 3.77 - 5.28 x10E6/uL   Hemoglobin 13.4 11.1 - 15.9 g/dL   Hematocrit 93.7 34.2 - 46.6 %   MCV 102 (H) 79 - 97 fL   MCH 33.6 (H) 26.6 - 33.0 pg   MCHC 32.8 31 - 35 g/dL   RDW 87.6 81.1 - 57.2 %   Platelets 287 150 - 450 x10E3/uL   Neutrophils 67 Not Estab. %   Lymphs 18 Not Estab. %   Monocytes 8 Not Estab. %   Eos 5 Not Estab. %   Basos 1 Not Estab. %   Neutrophils Absolute 4.9 1 - 7 x10E3/uL   Lymphocytes Absolute 1.3 0 - 3 x10E3/uL   Monocytes Absolute 0.6 0 - 0 x10E3/uL   EOS (ABSOLUTE) 0.3 0.0 - 0.4 x10E3/uL   Basophils Absolute 0.1 0 - 0 x10E3/uL   Immature Granulocytes 1 Not Estab. %   Immature Grans (Abs) 0.0 0.0 - 0.1 x10E3/uL  Comprehensive metabolic panel  Result Value Ref Range   Glucose 90 65 - 99 mg/dL   BUN 8 6 - 24 mg/dL   Creatinine, Ser 6.20 0.57 - 1.00 mg/dL   GFR calc non Af Amer 106 >59 mL/min/1.73   GFR calc Af Amer 122 >59 mL/min/1.73   BUN/Creatinine Ratio 13 9 - 23   Sodium 140 134 - 144 mmol/L   Potassium 4.4 3.5 - 5.2 mmol/L   Chloride 99 96 - 106 mmol/L   CO2 26 20 - 29 mmol/L   Calcium 9.6 8.7 - 10.2 mg/dL   Total Protein 7.0 6.0 - 8.5 g/dL   Albumin 4.5 3.8 - 4.8 g/dL   Globulin, Total 2.5 1.5 - 4.5 g/dL   Albumin/Globulin Ratio 1.8 1.2 - 2.2   Bilirubin Total 0.2 0.0 - 1.2 mg/dL   Alkaline Phosphatase 90 48 - 121 IU/L   AST 27 0 - 40 IU/L   ALT 26 0 - 32 IU/L  Lipid Panel w/o Chol/HDL Ratio  Result Value Ref Range   Cholesterol, Total 137 100 - 199 mg/dL   Triglycerides 54 0 - 149 mg/dL   HDL 66 >35 mg/dL   VLDL Cholesterol Cal 12 5 - 40 mg/dL   LDL Chol Calc (NIH) 59 0 - 99 mg/dL  TSH  Result Value Ref Range   TSH 1.920 0.450 - 4.500 uIU/mL  Microalbumin, Urine Waived  Result Value Ref Range   Microalb, Ur Waived 10 0 - 19 mg/L   Creatinine, Urine Waived 50 10  - 300 mg/dL   Microalb/Creat Ratio <30 <30 mg/g      Assessment & Plan:   Problem List Items Addressed This Visit      Other   Pyuria - Primary    Acute for 3 days.  UA noting moderate bacteria, 2+ LEUK, 1+ BLD, >30 WBC.  Will send for culture.  Script for [employer] sent in and recommend patient continue to increase fluid intake + take OTC Azo for symptom relief.  If culture returns showing need for alternate abx will alert patient, discussed with her today.  Return to office as needed if ongoing or worsening symptoms.      Relevant Orders   UA/M w/rflx Culture, Routine    Other Visit Diagnoses    Flu vaccine need       Relevant Orders   Flu Vaccine QUAD 36+ mos IM (Completed)       Follow up plan: Return if symptoms worsen or fail to improve.

## 2020-04-17 NOTE — Patient Instructions (Signed)
Urinary Tract Infection, Adult A urinary tract infection (UTI) is an infection of any part of the urinary tract. The urinary tract includes:  The kidneys.  The ureters.  The bladder.  The urethra. These organs make, store, and get rid of pee (urine) in the body. What are the causes? This is caused by germs (bacteria) in your genital area. These germs grow and cause swelling (inflammation) of your urinary tract. What increases the risk? You are more likely to develop this condition if:  You have a small, thin tube (catheter) to drain pee.  You cannot control when you pee or poop (incontinence).  You are female, and: ? You use these methods to prevent pregnancy:  A medicine that kills sperm (spermicide).  A device that blocks sperm (diaphragm). ? You have low levels of a female hormone (estrogen). ? You are pregnant.  You have genes that add to your risk.  You are sexually active.  You take antibiotic medicines.  You have trouble peeing because of: ? A prostate that is bigger than normal, if you are female. ? A blockage in the part of your body that drains pee from the bladder (urethra). ? A kidney stone. ? A nerve condition that affects your bladder (neurogenic bladder). ? Not getting enough to drink. ? Not peeing often enough.  You have other conditions, such as: ? Diabetes. ? A weak disease-fighting system (immune system). ? Sickle cell disease. ? Gout. ? Injury of the spine. What are the signs or symptoms? Symptoms of this condition include:  Needing to pee right away (urgently).  Peeing often.  Peeing small amounts often.  Pain or burning when peeing.  Blood in the pee.  Pee that smells bad or not like normal.  Trouble peeing.  Pee that is cloudy.  Fluid coming from the vagina, if you are female.  Pain in the belly or lower back. Other symptoms include:  Throwing up (vomiting).  No urge to eat.  Feeling mixed up (confused).  Being tired  and grouchy (irritable).  A fever.  Watery poop (diarrhea). How is this treated? This condition may be treated with:  Antibiotic medicine.  Other medicines.  Drinking enough water. Follow these instructions at home:  Medicines  Take over-the-counter and prescription medicines only as told by your doctor.  If you were prescribed an antibiotic medicine, take it as told by your doctor. Do not stop taking it even if you start to feel better. General instructions  Make sure you: ? Pee until your bladder is empty. ? Do not hold pee for a long time. ? Empty your bladder after sex. ? Wipe from front to back after pooping if you are a female. Use each tissue one time when you wipe.  Drink enough fluid to keep your pee pale yellow.  Keep all follow-up visits as told by your doctor. This is important. Contact a doctor if:  You do not get better after 1-2 days.  Your symptoms go away and then come back. Get help right away if:  You have very bad back pain.  You have very bad pain in your lower belly.  You have a fever.  You are sick to your stomach (nauseous).  You are throwing up. Summary  A urinary tract infection (UTI) is an infection of any part of the urinary tract.  This condition is caused by germs in your genital area.  There are many risk factors for a UTI. These include having a small, thin   tube to drain pee and not being able to control when you pee or poop.  Treatment includes antibiotic medicines for germs.  Drink enough fluid to keep your pee pale yellow. This information is not intended to replace advice given to you by your health care provider. Make sure you discuss any questions you have with your health care provider. Document Revised: 07/12/2018 Document Reviewed: 02/01/2018 Elsevier Patient Education  2020 [employer]  

## 2020-04-17 NOTE — Assessment & Plan Note (Signed)
Acute for 3 days.  UA noting moderate bacteria, 2+ LEUK, 1+ BLD, >30 WBC.  Will send for culture.  Script for [employer] sent in and recommend patient continue to increase fluid intake + take OTC Azo for symptom relief.  If culture returns showing need for alternate abx will alert patient, discussed with her today.  Return to office as needed if ongoing or worsening symptoms.

## 2020-04-20 LAB — UA/M W/RFLX CULTURE, ROUTINE
Bilirubin, UA: NEGATIVE
Glucose, UA: NEGATIVE
Ketones, UA: NEGATIVE
Nitrite, UA: NEGATIVE
Protein,UA: NEGATIVE
Specific Gravity, UA: 1.01 (ref 1.005–1.030)
Urobilinogen, Ur: 0.2 mg/dL (ref 0.2–1.0)
pH, UA: 6 (ref 5.0–7.5)

## 2020-04-20 LAB — MICROSCOPIC EXAMINATION: WBC, UA: >30 /hpf — AB (ref 0–5)

## 2020-04-20 LAB — URINE CULTURE, REFLEX

## 2020-07-07 HISTORY — PX: CARPAL TUNNEL RELEASE: SHX101

## 2020-07-17 ENCOUNTER — Other Ambulatory Visit: Payer: Self-pay

## 2020-07-17 ENCOUNTER — Ambulatory Visit (INDEPENDENT_AMBULATORY_CARE_PROVIDER_SITE_OTHER): Payer: BC Managed Care – PPO | Admitting: Nurse Practitioner

## 2020-07-17 ENCOUNTER — Encounter: Payer: Self-pay | Admitting: Nurse Practitioner

## 2020-07-17 VITALS — BP 102/66 | HR 85 | Temp 98.7°F | Ht 66.46 in | Wt 174.0 lb

## 2020-07-17 DIAGNOSIS — E1140 Type 2 diabetes mellitus with diabetic neuropathy, unspecified: Secondary | ICD-10-CM

## 2020-07-17 DIAGNOSIS — F17210 Nicotine dependence, cigarettes, uncomplicated: Secondary | ICD-10-CM

## 2020-07-17 DIAGNOSIS — Z6827 Body mass index (BMI) 27.0-27.9, adult: Secondary | ICD-10-CM

## 2020-07-17 DIAGNOSIS — E1159 Type 2 diabetes mellitus with other circulatory complications: Secondary | ICD-10-CM | POA: Diagnosis not present

## 2020-07-17 DIAGNOSIS — E1169 Type 2 diabetes mellitus with other specified complication: Secondary | ICD-10-CM | POA: Diagnosis not present

## 2020-07-17 DIAGNOSIS — E785 Hyperlipidemia, unspecified: Secondary | ICD-10-CM

## 2020-07-17 DIAGNOSIS — I152 Hypertension secondary to endocrine disorders: Secondary | ICD-10-CM

## 2020-07-17 LAB — BAYER DCA HB A1C WAIVED: HB A1C (BAYER DCA - WAIVED): 5.5 % (ref <7.0)

## 2020-07-17 NOTE — Assessment & Plan Note (Signed)
Chronic, stable.  Continue current medication regimen and adjust as needed.  Lipid panel today. 

## 2020-07-17 NOTE — Patient Instructions (Signed)

## 2020-07-17 NOTE — Assessment & Plan Note (Signed)
Chronic, stable with current A1C 5.5% today with urine ALB 10 recent visit.  Continue current medication regimen and continue reduction of medications at next visit if remains stable and has continued weight loss.  Recommend she continue to monitor BS at home with goal <130 in morning and <180 two hours after a meal.  At this time she wishes to maintain regimen due to benefit with weight.  Return in 6 months as A1C has been well-controlled for well over a year. Physical next visit.

## 2020-07-17 NOTE — Assessment & Plan Note (Signed)
Recommended eating smaller high protein, low fat meals more frequently and exercising 30 mins a day 5 times a week with a goal of 10-15lb weight loss in the next 3 months. Patient voiced their understanding and motivation to adhere to these recommendations.  

## 2020-07-17 NOTE — Assessment & Plan Note (Signed)
Chronic, stable with BP at goal in office today.  Continue Lisinopril, which offers kidney protection.  Urine micro ALB 10 and A:C <30 recent visit.  Recommend she check BP at home at least 3 mornings a week and focus on DASH diet.  Obtain BMP today.  Return in 6 months.

## 2020-07-17 NOTE — Progress Notes (Signed)
BP 102/66   Pulse 85   Temp 98.7 F (37.1 C) (Oral)   Ht 5' 6.46" (1.688 m)   Wt 174 lb (78.9 kg)   LMP 11/06/2017 (Approximate)   SpO2 98%   BMI 27.70 kg/m    Subjective:    Patient ID: Holly Morales, female    DOB: 1971/02/12, 49 y.o.   MRN: [id number]  HPI: Holly Morales is a 49 y.o. female  Chief Complaint  Patient presents with  . Diabetes   DIABETES Recent A1C 5.6% in June,  Continues on Metformin 500 MG QDAY & Saxenda 0.8 MG weekly.   Hypoglycemic episodes:no Polydipsia/polyuria: no Visual disturbance: no Chest pain: no Paresthesias: no Glucose Monitoring: yes             Accucheck frequency: every other day             Fasting glucose: 110 range in morning             Post prandial:             Evening:             Before meals: Taking Insulin?: no             Long acting insulin:             Short acting insulin: Blood Pressure Monitoring: not checking Retinal Examination: Not Up To Date Foot Exam: Up to Date Pneumovax: Up to Date Influenza: Up to Date Aspirin: no, is going to start taking  HYPERTENSION / HYPERLIPIDEMIA Continues on Simvastatin for HLD and Lisinopril for HTN. Has returned to smoking, quit for short period.  Has lots of stressors at home wiwht mom having dementia.  Prior to this she smoked 1/2 PPD, had been smoking since 23 and attempted quitting a couple times.  Satisfied with current treatment? yes Duration of hypertension: chronic BP monitoring frequency: not checking BP range:  BP medication side effects: no Duration of hyperlipidemia: chronic Cholesterol medication side effects: no Cholesterol supplements: none Medication compliance: good compliance Aspirin: no Recent stressors: no Recurrent headaches: no Visual changes: no Palpitations: no Dyspnea: no Chest pain: no Lower extremity edema: no Dizzy/lightheaded: no  Relevant past medical, surgical, family and social history reviewed and updated as indicated. Interim  medical history since our last visit reviewed. Allergies and medications reviewed and updated.  Review of Systems  Constitutional: Negative for activity change, appetite change, diaphoresis, fatigue and fever.  Respiratory: Negative for cough, chest tightness and shortness of breath.   Cardiovascular: Negative for chest pain, palpitations and leg swelling.  Gastrointestinal: Negative for abdominal distention, abdominal pain, constipation, diarrhea, nausea and vomiting.  Endocrine: Negative for cold intolerance, heat intolerance, polydipsia, polyphagia and polyuria.  Neurological: Negative for dizziness, syncope, weakness, light-headedness, numbness and headaches.  Psychiatric/Behavioral: Negative.     Per HPI unless specifically indicated above     Objective:    BP 102/66   Pulse 85   Temp 98.7 F (37.1 C) (Oral)   Ht 5' 6.46" (1.688 m)   Wt 174 lb (78.9 kg)   LMP 11/06/2017 (Approximate)   SpO2 98%   BMI 27.70 kg/m   Wt Readings from Last 3 Encounters:  07/17/20 174 lb (78.9 kg)  04/17/20 172 lb (78 kg)  01/10/20 165 lb (74.8 kg)    Physical Exam Vitals and nursing note reviewed.  Constitutional:      General: She is awake. She is not in acute distress.  Appearance: She is well-developed and overweight. She is not ill-appearing.  HENT:     Head: Normocephalic.     Right Ear: Hearing normal.     Left Ear: Hearing normal.  Eyes:     General: Lids are normal.        Right eye: No discharge.        Left eye: No discharge.     Conjunctiva/sclera: Conjunctivae normal.     Pupils: Pupils are equal, round, and reactive to light.  Neck:     Thyroid: No thyromegaly.     Vascular: No carotid bruit.  Cardiovascular:     Rate and Rhythm: Normal rate and regular rhythm.     Heart sounds: Normal heart sounds. No murmur heard. No gallop.   Pulmonary:     Effort: Pulmonary effort is normal. No accessory muscle usage or respiratory distress.     Breath sounds: Normal breath  sounds.  Abdominal:     General: Bowel sounds are normal.     Palpations: Abdomen is soft.  Musculoskeletal:     Cervical back: Normal range of motion and neck supple.     Right lower leg: No edema.     Left lower leg: No edema.  Lymphadenopathy:     Cervical: No cervical adenopathy.  Skin:    General: Skin is warm and dry.  Neurological:     Mental Status: She is alert and oriented to person, place, and time.  Psychiatric:        Attention and Perception: Attention normal.        Mood and Affect: Mood normal.        Behavior: Behavior normal. Behavior is cooperative.        Thought Content: Thought content normal.        Judgment: Judgment normal.    Results for orders placed or performed in visit on 04/17/20  Microscopic Examination   Urine  Result Value Ref Range   WBC, UA >30 (A) 0 - 5 /hpf   RBC 3-10 (A) 0 - 2 /hpf   Epithelial Cells (non renal) 0-10 0 - 10 /hpf   Bacteria, UA Moderate (A) None seen/Few  Urine Culture, Reflex   Urine  Result Value Ref Range   Urine Culture, Routine Final report (A)    Organism ID, Bacteria Comment (A)   UA/M w/rflx Culture, Routine   Specimen: Urine   Urine  Result Value Ref Range   Specific Gravity, UA 1.010 1.005 - 1.030   pH, UA 6.0 5.0 - 7.5   Color, UA Yellow Yellow   Appearance Ur Cloudy (A) Clear   Leukocytes,UA 2+ (A) Negative   Protein,UA Negative Negative/Trace   Glucose, UA Negative Negative   Ketones, UA Negative Negative   RBC, UA 1+ (A) Negative   Bilirubin, UA Negative Negative   Urobilinogen, Ur 0.2 0.2 - 1.0 mg/dL   Nitrite, UA Negative Negative   Microscopic Examination See below:    Urinalysis Reflex Comment       Assessment & Plan:   Problem List Items Addressed This Visit      Cardiovascular and Mediastinum   Hypertension associated with diabetes (HCC)    Chronic, stable with BP at goal in office today.  Continue Lisinopril, which offers kidney protection.  Urine micro ALB 10 and A:C <30 recent  visit.  Recommend she check BP at home at least 3 mornings a week and focus on DASH diet.  Obtain BMP today.  Return in  6 months.      Relevant Orders   Basic metabolic panel   Bayer DCA Hb M2U Waived     Endocrine   Controlled type 2 diabetes with neuropathy (HCC) - Primary    Chronic, stable with current A1C 5.5% today with urine ALB 10 recent visit.  Continue current medication regimen and continue reduction of medications at next visit if remains stable and has continued weight loss.  Recommend she continue to monitor BS at home with goal <130 in morning and <180 two hours after a meal.  At this time she wishes to maintain regimen due to benefit with weight.  Return in 6 months as A1C has been well-controlled for well over a year. Physical next visit.      Relevant Orders   Bayer DCA Hb A1c Waived   Hyperlipidemia associated with type 2 diabetes mellitus (HCC)    Chronic, stable.  Continue current medication regimen and adjust as needed.  Lipid panel today.      Relevant Orders   Bayer DCA Hb A1c Waived   Lipid Panel w/o Chol/HDL Ratio     Other   BMI 63.3-35.4,TGYBW    Recommended eating smaller high protein, low fat meals more frequently and exercising 30 mins a day 5 times a week with a goal of 10-15lb weight loss in the next 3 months. Patient voiced their understanding and motivation to adhere to these recommendations.       Nicotine dependence, cigarettes, uncomplicated    I have recommended complete cessation of tobacco use. I have discussed various options available for assistance with tobacco cessation including over the counter methods (Nicotine gum, patch and lozenges). We also discussed prescription options (Chantix, Nicotine Inhaler / Nasal Spray). The patient is not interested in pursuing any prescription tobacco cessation options at this time.           Follow up plan: Return in about 6 months (around 01/15/2021) for Annual physical.

## 2020-07-17 NOTE — Assessment & Plan Note (Signed)
I have recommended complete cessation of tobacco use. I have discussed various options available for assistance with tobacco cessation including over the counter methods (Nicotine gum, patch and lozenges). We also discussed prescription options (Chantix, Nicotine Inhaler / Nasal Spray). The patient is not interested in pursuing any prescription tobacco cessation options at this time.  

## 2020-07-18 LAB — BASIC METABOLIC PANEL
BUN/Creatinine Ratio: 17 (ref 9–23)
BUN: 10 mg/dL (ref 6–24)
CO2: 25 mmol/L (ref 20–29)
Calcium: 9.4 mg/dL (ref 8.7–10.2)
Chloride: 101 mmol/L (ref 96–106)
Creatinine, Ser: 0.58 mg/dL (ref 0.57–1.00)
GFR calc Af Amer: 124 mL/min/{1.73_m2} (ref >59)
GFR calc non Af Amer: 108 mL/min/{1.73_m2} (ref >59)
Glucose: 105 mg/dL — ABNORMAL HIGH (ref 65–99)
Potassium: 4.6 mmol/L (ref 3.5–5.2)
Sodium: 139 mmol/L (ref 134–144)

## 2020-07-18 LAB — LIPID PANEL W/O CHOL/HDL RATIO
Cholesterol, Total: 139 mg/dL (ref 100–199)
HDL: 64 mg/dL (ref >39)
LDL Chol Calc (NIH): 61 mg/dL (ref 0–99)
Triglycerides: 68 mg/dL (ref 0–149)
VLDL Cholesterol Cal: 14 mg/dL (ref 5–40)

## 2020-07-18 NOTE — Progress Notes (Signed)
Good morning, please let Oluwanifemi know her labs have returned and electrolytes, kidney function, and cholesterol levels remain within normal range.  If any questions let me know.  Hope the job interview went well.:) Keep being awesome!!  Thank you for allowing me to participate in your care. Kindest regards, Aliciana Ricciardi

## 2020-07-22 ENCOUNTER — Other Ambulatory Visit: Payer: Self-pay | Admitting: Nurse Practitioner

## 2020-09-04 LAB — HM DIABETES EYE EXAM

## 2020-12-23 ENCOUNTER — Other Ambulatory Visit: Payer: Self-pay | Admitting: Nurse Practitioner

## 2020-12-23 DIAGNOSIS — Z1231 Encounter for screening mammogram for malignant neoplasm of breast: Secondary | ICD-10-CM

## 2021-01-13 ENCOUNTER — Other Ambulatory Visit: Payer: Self-pay | Admitting: Nurse Practitioner

## 2021-01-13 ENCOUNTER — Telehealth: Payer: Self-pay

## 2021-01-13 NOTE — Telephone Encounter (Signed)
Scheduled 6/20

## 2021-01-13 NOTE — Telephone Encounter (Signed)
Requested medications are due for refill today.  yes  Requested medications are on the active medications list.  yes  Last refill. 10/21/2019  Future visit scheduled.   yes  Notes to clinic.  Prescription is expired.

## 2021-01-13 NOTE — Telephone Encounter (Signed)
PA initiated and submitted for Saxenda Cover My Meds. Key: [id number]

## 2021-01-22 ENCOUNTER — Encounter: Payer: BC Managed Care – PPO | Admitting: Nurse Practitioner

## 2021-01-25 ENCOUNTER — Ambulatory Visit
Admission: RE | Admit: 2021-01-25 | Discharge: 2021-01-25 | Disposition: A | Discharge status: Home / Self Care | Payer: BC Managed Care – PPO | Source: Ambulatory Visit | Attending: Nurse Practitioner | Admitting: Nurse Practitioner

## 2021-01-25 ENCOUNTER — Ambulatory Visit (INDEPENDENT_AMBULATORY_CARE_PROVIDER_SITE_OTHER): Payer: BC Managed Care – PPO | Admitting: Nurse Practitioner

## 2021-01-25 ENCOUNTER — Other Ambulatory Visit: Payer: Self-pay

## 2021-01-25 ENCOUNTER — Encounter: Payer: Self-pay | Admitting: Nurse Practitioner

## 2021-01-25 ENCOUNTER — Ambulatory Visit
Admission: RE | Admit: 2021-01-25 | Discharge: 2021-01-25 | Disposition: A | Discharge status: Home / Self Care | Payer: BC Managed Care – PPO | Attending: Nurse Practitioner | Admitting: Nurse Practitioner

## 2021-01-25 VITALS — BP 126/71 | HR 82 | Temp 98.4°F | Ht 64.8 in | Wt 175.6 lb

## 2021-01-25 DIAGNOSIS — E1159 Type 2 diabetes mellitus with other circulatory complications: Secondary | ICD-10-CM

## 2021-01-25 DIAGNOSIS — M79672 Pain in left foot: Secondary | ICD-10-CM

## 2021-01-25 DIAGNOSIS — Z1211 Encounter for screening for malignant neoplasm of colon: Secondary | ICD-10-CM | POA: Diagnosis not present

## 2021-01-25 DIAGNOSIS — F17210 Nicotine dependence, cigarettes, uncomplicated: Secondary | ICD-10-CM

## 2021-01-25 DIAGNOSIS — Z0000 Encounter for general adult medical examination without abnormal findings: Secondary | ICD-10-CM

## 2021-01-25 DIAGNOSIS — E1169 Type 2 diabetes mellitus with other specified complication: Secondary | ICD-10-CM

## 2021-01-25 DIAGNOSIS — I152 Hypertension secondary to endocrine disorders: Secondary | ICD-10-CM

## 2021-01-25 DIAGNOSIS — E1140 Type 2 diabetes mellitus with diabetic neuropathy, unspecified: Secondary | ICD-10-CM

## 2021-01-25 DIAGNOSIS — E785 Hyperlipidemia, unspecified: Secondary | ICD-10-CM

## 2021-01-25 LAB — BAYER DCA HB A1C WAIVED: HB A1C (BAYER DCA - WAIVED): 5.8 % (ref <7.0)

## 2021-01-25 NOTE — Progress Notes (Signed)
BP 126/71   Pulse 82   Temp 98.4 F (36.9 C) (Oral)   Ht 5' 4.8" (1.646 m)   Wt 175 lb 9.6 oz (79.7 kg)   LMP 11/06/2017 (Approximate)   SpO2 96%   BMI 29.40 kg/m    Subjective:    Patient ID: Holly Morales, female    DOB: Sep 20, 1970, 50 y.o.   MRN: [id number]  Chief Complaint  Patient presents with   Annual Exam   HPI: Holly Morales is a 50 y.o. female presenting on 01/25/2021 for comprehensive medical examination. Current medical complaints include: left foot pain and swelling  FOOT PAIN  Duration: weeks, started Memorial Day weekend Involved foot: left Mechanism of injury: unknown Location: medial foot Onset: gradual  Severity: mild  Quality:  sharp Frequency: intermittent Radiation:  sometimes up her leg Aggravating factors:  unkown   Alleviating factors: NSAIDs and rest  Status: fluctuating Treatments attempted: rest and ibuprofen  Relief with NSAIDs?:  mild Weakness with weight bearing or walking: no Morning stiffness: no Swelling: yes Redness:  sometimes Bruising: no Paresthesias / decreased sensation: yes , at times Fevers:no   DIABETES  Hypoglycemic episodes:no Polydipsia/polyuria: no Visual disturbance: no Chest pain: no Paresthesias: no Glucose Monitoring: no  Accucheck frequency: n/a  Fasting glucose:  Post prandial:  Evening:  Before meals: Taking Insulin?: no  Long acting insulin:  Short acting insulin: Blood Pressure Monitoring: not checking Retinal Examination: Up to Date Foot Exam: Up to Date Diabetic Education: Completed Pneumovax: Up to Date Influenza: Up to Date Aspirin: no   She currently lives with: boyfriend Menopausal Symptoms: no  Depression Screen done today and results listed below:  Depression screen [facility] 2/9 01/25/2021 07/17/2020 01/10/2020 01/07/2019 09/11/2018  Decreased Interest 0 0 0 0 0  Down, Depressed, Hopeless 0 0 0 0 0  PHQ - 2 Score 0 0 0 0 0  Altered sleeping - - 0 0 2  Tired, decreased energy - - 1 0 2   Change in appetite - - 0 0 0  Feeling bad or failure about yourself  - - 0 0 0  Trouble concentrating - - 1 0 0  Moving slowly or fidgety/restless - - 0 0 0  Suicidal thoughts - - 0 0 0  PHQ-9 Score - - 2 0 4  Difficult doing work/chores - - Not difficult at all Not difficult at all -    The patient does not have a history of falls. I did not complete a risk assessment for falls. A plan of care for falls was not documented.   Past Medical History:  Past Medical History:  Diagnosis Date   Depression    06/25/18 no longer an issue per pt    Diabetes mellitus without complication (HCC)    High cholesterol    Obesity (BMI 30-39.9)     Surgical History:  Past Surgical History:  Procedure Laterality Date   CARPAL TUNNEL RELEASE Right 07/07/2020   LEEP  2009   NO PAST SURGERIES      Medications:  Current Outpatient Medications on File Prior to Visit  Medication Sig   aspirin EC 81 MG tablet Take 81 mg by mouth daily. Swallow whole.   B-D ULTRAFINE III SHORT PEN 31G X 8 MM MISC USE ONE NEEDLE DAILY TO INJECT SAXENDA   cyclobenzaprine (FLEXERIL) 5 MG tablet Take 1 tablet (5 mg total) by mouth 3 (three) times daily as needed for muscle spasms.   glucose blood (ONE TOUCH  ULTRA TEST) test strip USE TO TEST BLOOD SUGAR DAILY   ibuprofen (ADVIL) 800 MG tablet Take 1 tablet (800 mg total) by mouth every 8 (eight) hours as needed.   lisinopril (ZESTRIL) 10 MG tablet Take 1 tablet (10 mg total) by mouth daily.   metFORMIN (GLUCOPHAGE) 500 MG tablet Take 1 tablet (500 mg total) by mouth daily with breakfast.   ONETOUCH DELICA LANCETS 33G MISC USE TO TEST BLOOD SUGAR DAILY   SAXENDA 18 MG/3ML SOPN INJECT 0.4 MLS (2.4 MG TOTAL) INTO THE SKIN DAILY.   simvastatin (ZOCOR) 40 MG tablet Take 1 tablet (40 mg total) by mouth at bedtime.   valACYclovir (VALTREX) 1000 MG tablet TAKE 1 TABLET BY MOUTH TWICE A DAY   No current facility-administered medications on file prior to visit.     Allergies:  Allergies  Allergen Reactions   Jardiance [Empagliflozin]     Candidiasis    Social History:  Social History   Socioeconomic History   Marital status: Divorced    Spouse name: Not on file   Number of children: 0   Years of education: Not on file   Highest education level: High school graduate  Occupational History   Not on file  Tobacco Use   Smoking status: Every Day    Packs/day: 0.50    Pack years: 0.00    Types: Cigarettes    Last attempt to quit: 07/06/2019    Years since quitting: 1.5   Smokeless tobacco: Never  Vaping Use   Vaping Use: Never used  Substance and Sexual Activity   Alcohol use: Yes    Alcohol/week: 1.0 standard drink    Types: 1 Standard drinks or equivalent per week    Comment: on occasion   Drug use: No    Comment: 06/25/18--30 yrs ago smoked marijuana   Sexual activity: Yes  Other Topics Concern   Not on file  Social History Narrative   Lives at home alone   Right handed   Caffeine: 1 pot of coffee every morning, 1 cup of unsweet tea daily, lots of water, very seldom soda.   Social Determinants of Health   Financial Resource Strain: Not on file  Food Insecurity: Not on file  Transportation Needs: Not on file  Physical Activity: Not on file  Stress: Not on file  Social Connections: Not on file  Intimate Partner Violence: Not on file   Social History   Tobacco Use  Smoking Status Every Day   Packs/day: 0.50   Pack years: 0.00   Types: Cigarettes   Last attempt to quit: 07/06/2019   Years since quitting: 1.5  Smokeless Tobacco Never   Social History   Substance and Sexual Activity  Alcohol Use Yes   Alcohol/week: 1.0 standard drink   Types: 1 Standard drinks or equivalent per week   Comment: on occasion    Family History:  Family History  Problem Relation Age of Onset   Diabetes Father    Cancer Father        Prostate   Irregular heart beat Father    Breast cancer Neg Hx     Past medical history,  surgical history, medications, allergies, family history and social history reviewed with patient today and changes made to appropriate areas of the chart.   Review of Systems  Constitutional: Negative.   HENT: Negative.    Eyes: Negative.   Respiratory: Negative.    Cardiovascular: Negative.   Gastrointestinal: Negative.   Genitourinary: Negative.  Musculoskeletal:  Positive for back pain (chronic).       Left foot swelling, see HPI  Skin: Negative.   Neurological:  Positive for dizziness (intermittent, mild). Negative for weakness and headaches.  Psychiatric/Behavioral: Negative.    All other ROS negative except what is listed above and in the HPI.      Objective:    BP 126/71   Pulse 82   Temp 98.4 F (36.9 C) (Oral)   Ht 5' 4.8" (1.646 m)   Wt 175 lb 9.6 oz (79.7 kg)   LMP 11/06/2017 (Approximate)   SpO2 96%   BMI 29.40 kg/m   Wt Readings from Last 3 Encounters:  01/25/21 175 lb 9.6 oz (79.7 kg)  07/17/20 174 lb (78.9 kg)  04/17/20 172 lb (78 kg)    Physical Exam Vitals and nursing note reviewed.  Constitutional:      General: She is not in acute distress.    Appearance: Normal appearance.  HENT:     Head: Normocephalic and atraumatic.     Right Ear: Tympanic membrane, ear canal and external ear normal.     Left Ear: Tympanic membrane, ear canal and external ear normal.     Nose: Nose normal.     Mouth/Throat:     Mouth: Mucous membranes are moist.     Pharynx: Oropharynx is clear.  Eyes:     Conjunctiva/sclera: Conjunctivae normal.  Cardiovascular:     Rate and Rhythm: Normal rate and regular rhythm.     Pulses: Normal pulses.     Heart sounds: Normal heart sounds.  Pulmonary:     Effort: Pulmonary effort is normal.     Breath sounds: Normal breath sounds.  Abdominal:     General: Bowel sounds are normal.     Palpations: Abdomen is soft.     Tenderness: There is no abdominal tenderness.  Musculoskeletal:        General: Swelling (left medial foot)  present. Normal range of motion.     Cervical back: Normal range of motion and neck supple.     Comments: Straight leg raise negative bilaterally  Lymphadenopathy:     Cervical: No cervical adenopathy.  Skin:    General: Skin is warm and dry.  Neurological:     General: No focal deficit present.     Mental Status: She is alert and oriented to person, place, and time.     Cranial Nerves: No cranial nerve deficit.     Coordination: Coordination normal.     Gait: Gait normal.  Psychiatric:        Mood and Affect: Mood normal.        Behavior: Behavior normal.        Thought Content: Thought content normal.        Judgment: Judgment normal.   Diabetic Foot Exam - Simple   Simple Foot Form Visual Inspection No deformities, no ulcerations, no other skin breakdown bilaterally: Yes Sensation Testing See comments: Yes Pulse Check Posterior Tibialis and Dorsalis pulse intact bilaterally: Yes Comments Right foot with monofilament intact. Left foot with areas to her arch and toes that did not feel monofilament.       Results for orders placed or performed in visit on 01/25/21  Bayer DCA Hb A1c Waived  Result Value Ref Range   HB A1C (BAYER DCA - WAIVED) 5.8 <7.0 %      Assessment & Plan:   Problem List Items Addressed This Visit  Cardiovascular and Mediastinum   Hypertension associated with diabetes ([place])    Chronic, stable. BP 126/71 today. Continue current regimen. Check CMP, CBC today. Follow up in 6 months.        Relevant Medications   aspirin EC 81 MG tablet   Other Relevant Orders   Comp Met (CMET)   CBC with Differential     Endocrine   Controlled type 2 diabetes with neuropathy (HCC)    Chronic, below goal. A1C today is 5.8%. Will stop metformin. She is taking saxenda as well which is helping maintain weight loss. Will continue this for now. Foot exam done with some limited sensation to left foot with monofilament testing. Foot pain may be related to  neuropathy. Consider gabapentin if x-ray is normal. Follow-up in 3 months.        Relevant Medications   aspirin EC 81 MG tablet   Other Relevant Orders   Comp Met (CMET)   Vitamin B12   CBC with Differential   Bayer DCA Hb A1c Waived (Completed)   Hyperlipidemia associated with type 2 diabetes mellitus (HCC)    Chronic. Currently on simvastatin. Will check lipid panel today. Follow up in 6 months       Relevant Medications   aspirin EC 81 MG tablet   Other Relevant Orders   Comp Met (CMET)   CBC with Differential   Lipid Panel w/o Chol/HDL Ratio     Other   Nicotine dependence, cigarettes, uncomplicated    Currently smoking 1/2 ppd. Discussed complete tobacco cessation. She is not interested at this time.        Other Visit Diagnoses     Colon cancer screening    -  Primary   Referral placed to GI for colonoscopy   Relevant Orders   Ambulatory referral to Gastroenterology   Left foot pain       No injury, noted swelling and pain x 3 weeks. Will check x-ray. She can use ice, tylenol or ibuprofen for pain. May be related to neuropathy. Check vit b12   Relevant Orders   DG Foot Complete Left   Routine general medical examination at a health care facility       Health maintenance reviewed and updated. Check TSH today.    Relevant Orders   TSH        Follow up plan: Return in about 3 months (around 04/27/2021) for dm, htn.   LABORATORY TESTING:  - Pap smear: up to date  IMMUNIZATIONS:   - Tdap: Tetanus vaccination status reviewed: last tetanus booster within 10 years. - Influenza: Up to date - Pneumovax: Up to date - Prevnar: Up to date - HPV: Not applicable - Zostavax vaccine:  will check with insurance for coverage  SCREENING: -Mammogram: Up to date  - Colonoscopy: Ordered today  - Bone Density: Not applicable  -Hearing Test: Not applicable  -Spirometry: Not applicable   PATIENT COUNSELING:   Advised to take 1 mg of folate supplement per day if  capable of pregnancy.   Sexuality: Discussed sexually transmitted diseases, partner selection, use of condoms, avoidance of unintended pregnancy  and contraceptive alternatives.   Advised to avoid cigarette smoking.  I discussed with the patient that most people either abstain from alcohol or drink within safe limits (<=14/week and <=4 drinks/occasion for males, <=7/weeks and <= 3 drinks/occasion for females) and that the risk for alcohol disorders and other health effects rises proportionally with the number of drinks per week and how often  a drinker exceeds daily limits.  Discussed cessation/primary prevention of drug use and availability of treatment for abuse.   Diet: Encouraged to adjust caloric intake to maintain  or achieve ideal body weight, to reduce intake of dietary saturated fat and total fat, to limit sodium intake by avoiding high sodium foods and not adding table salt, and to maintain adequate dietary potassium and calcium preferably from fresh fruits, vegetables, and low-fat dairy products.    stressed the importance of regular exercise  Injury prevention: Discussed safety belts, safety helmets, smoke detector, smoking near bedding or upholstery.   Dental health: Discussed importance of regular tooth brushing, flossing, and dental visits.    NEXT PREVENTATIVE PHYSICAL DUE IN 1 YEAR. Return in about 3 months (around 04/27/2021) for dm, htn.

## 2021-01-25 NOTE — Assessment & Plan Note (Signed)
Currently smoking 1/2 ppd. Discussed complete tobacco cessation. She is not interested at this time.

## 2021-01-25 NOTE — Assessment & Plan Note (Signed)
Chronic. Currently on simvastatin. Will check lipid panel today. Follow up in 6 months

## 2021-01-25 NOTE — Assessment & Plan Note (Signed)
Chronic, stable. BP 126/71 today. Continue current regimen. Check CMP, CBC today. Follow up in 6 months.

## 2021-01-25 NOTE — Assessment & Plan Note (Signed)
Chronic, below goal. A1C today is 5.8%. Will stop metformin. She is taking saxenda as well which is helping maintain weight loss. Will continue this for now. Foot exam done with some limited sensation to left foot with monofilament testing. Foot pain may be related to neuropathy. Consider gabapentin if x-ray is normal. Follow-up in 3 months.

## 2021-01-26 ENCOUNTER — Other Ambulatory Visit: Payer: Self-pay

## 2021-01-26 DIAGNOSIS — E1169 Type 2 diabetes mellitus with other specified complication: Secondary | ICD-10-CM

## 2021-01-26 DIAGNOSIS — E785 Hyperlipidemia, unspecified: Secondary | ICD-10-CM

## 2021-01-26 LAB — CBC WITH DIFFERENTIAL/PLATELET
Basophils Absolute: 0.1 10*3/uL (ref 0.0–0.2)
Basos: 1 %
EOS (ABSOLUTE): 0.3 10*3/uL (ref 0.0–0.4)
Eos: 3 %
Hematocrit: 41.4 % (ref 34.0–46.6)
Hemoglobin: 13.8 g/dL (ref 11.1–15.9)
Immature Grans (Abs): 0 10*3/uL (ref 0.0–0.1)
Immature Granulocytes: 0 %
Lymphocytes Absolute: 1.6 10*3/uL (ref 0.7–3.1)
Lymphs: 23 %
MCH: 32.6 pg (ref 26.6–33.0)
MCHC: 33.3 g/dL (ref 31.5–35.7)
MCV: 98 fL — ABNORMAL HIGH (ref 79–97)
Monocytes Absolute: 0.6 10*3/uL (ref 0.1–0.9)
Monocytes: 8 %
Neutrophils Absolute: 4.7 10*3/uL (ref 1.4–7.0)
Neutrophils: 65 %
Platelets: 264 10*3/uL (ref 150–450)
RBC: 4.23 x10E6/uL (ref 3.77–5.28)
RDW: 12 % (ref 11.7–15.4)
WBC: 7.3 10*3/uL (ref 3.4–10.8)

## 2021-01-26 LAB — LIPID PANEL W/O CHOL/HDL RATIO
Cholesterol, Total: 191 mg/dL (ref 100–199)
HDL: 68 mg/dL (ref >39)
LDL Chol Calc (NIH): 106 mg/dL — ABNORMAL HIGH (ref 0–99)
Triglycerides: 95 mg/dL (ref 0–149)
VLDL Cholesterol Cal: 17 mg/dL (ref 5–40)

## 2021-01-26 LAB — COMPREHENSIVE METABOLIC PANEL
ALT: 23 IU/L (ref 0–32)
AST: 22 IU/L (ref 0–40)
Albumin/Globulin Ratio: 2 (ref 1.2–2.2)
Albumin: 4.9 g/dL (ref 3.8–4.9)
Alkaline Phosphatase: 89 IU/L (ref 44–121)
BUN/Creatinine Ratio: 16 (ref 9–23)
BUN: 10 mg/dL (ref 6–24)
Bilirubin Total: 0.4 mg/dL (ref 0.0–1.2)
CO2: 23 mmol/L (ref 20–29)
Calcium: 10 mg/dL (ref 8.7–10.2)
Chloride: 100 mmol/L (ref 96–106)
Creatinine, Ser: 0.62 mg/dL (ref 0.57–1.00)
Globulin, Total: 2.4 g/dL (ref 1.5–4.5)
Glucose: 104 mg/dL — ABNORMAL HIGH (ref 65–99)
Potassium: 4.4 mmol/L (ref 3.5–5.2)
Sodium: 140 mmol/L (ref 134–144)
Total Protein: 7.3 g/dL (ref 6.0–8.5)
eGFR: 108 mL/min/{1.73_m2} (ref >59)

## 2021-01-26 LAB — TSH: TSH: 1.86 u[IU]/mL (ref 0.450–4.500)

## 2021-01-26 LAB — VITAMIN B12: Vitamin B-12: 439 pg/mL (ref 232–1245)

## 2021-01-26 MED ORDER — LISINOPRIL 10 MG PO TABS: 10.0000 mg | ORAL_TABLET | Freq: Every day | ORAL | 4 refills | Status: DC

## 2021-01-26 MED ORDER — SIMVASTATIN 40 MG PO TABS: 40.0000 mg | ORAL_TABLET | Freq: Every day | ORAL | 4 refills | Status: DC

## 2021-01-26 MED ORDER — IBUPROFEN 800 MG PO TABS: 800.0000 mg | ORAL_TABLET | Freq: Three times a day (TID) | ORAL | 5 refills | Status: DC | PRN

## 2021-01-26 MED ORDER — CYCLOBENZAPRINE HCL 5 MG PO TABS: 5.0000 mg | ORAL_TABLET | Freq: Three times a day (TID) | ORAL | 2 refills | Status: DC | PRN

## 2021-01-26 MED ORDER — SAXENDA 18 MG/3ML ~~LOC~~ SOPN: 2.4000 mg | PEN_INJECTOR | Freq: Every day | SUBCUTANEOUS | 6 refills | Status: DC

## 2021-01-26 NOTE — Addendum Note (Signed)
Addended by: Rodman Pickle A on: 01/26/2021 11:21 AM   Modules accepted: Orders

## 2021-01-26 NOTE — Telephone Encounter (Signed)
Patient seen yesterday for CPE

## 2021-01-26 NOTE — Progress Notes (Signed)
Patient aware of results, no further questions.  ?

## 2021-01-28 LAB — URIC ACID: Uric Acid: 3.8 mg/dL (ref 3.0–7.2)

## 2021-01-28 LAB — SPECIMEN STATUS REPORT

## 2021-02-01 ENCOUNTER — Ambulatory Visit
Admission: RE | Admit: 2021-02-01 | Discharge: 2021-02-01 | Disposition: A | Discharge status: Home / Self Care | Payer: BC Managed Care – PPO | Source: Ambulatory Visit | Attending: Nurse Practitioner | Admitting: Nurse Practitioner

## 2021-02-01 ENCOUNTER — Other Ambulatory Visit: Payer: Self-pay

## 2021-02-01 DIAGNOSIS — Z1231 Encounter for screening mammogram for malignant neoplasm of breast: Secondary | ICD-10-CM | POA: Diagnosis not present

## 2021-02-02 NOTE — Progress Notes (Signed)
Good afternoon let Holly Morales know her mammogram is normal.

## 2021-03-03 ENCOUNTER — Telehealth (INDEPENDENT_AMBULATORY_CARE_PROVIDER_SITE_OTHER): Payer: Self-pay | Admitting: Gastroenterology

## 2021-03-03 DIAGNOSIS — Z1211 Encounter for screening for malignant neoplasm of colon: Secondary | ICD-10-CM

## 2021-03-03 MED ORDER — PEG 3350-KCL-NA BICARB-NACL 420 G PO SOLR: 4000.0000 mL | Freq: Once | ORAL | 0 refills | Status: AC

## 2021-03-03 NOTE — Progress Notes (Signed)
Gastroenterology Pre-Procedure Review  Request Date: 04/15/21 Requesting Physician: Dr. Tobi Bastos  PATIENT REVIEW QUESTIONS: The patient responded to the following health history questions as indicated:    1. Are you having any GI issues? no 2. Do you have a personal history of Polyps? no 3. Do you have a family history of Colon Cancer or Polyps? no 4. Diabetes Mellitus? Type II 5. Joint replacements in the past 12 months?no 6. Major health problems in the past 3 months?no 7. Any artificial heart valves, MVP, or defibrillator?no    MEDICATIONS & ALLERGIES:    Patient reports the following regarding taking any anticoagulation/antiplatelet therapy:   Plavix, Coumadin, Eliquis, Xarelto, Lovenox, Pradaxa, Brilinta, or Effient? no Aspirin? yes (81 mg)  Patient confirms/reports the following medications:  Current Outpatient Medications  Medication Sig Dispense Refill   aspirin EC 81 MG tablet Take 81 mg by mouth daily. Swallow whole.     B-D ULTRAFINE III SHORT PEN 31G X 8 MM MISC USE ONE NEEDLE DAILY TO INJECT SAXENDA 100 each 11   cyclobenzaprine (FLEXERIL) 5 MG tablet Take 1 tablet (5 mg total) by mouth 3 (three) times daily as needed for muscle spasms. 90 tablet 2   glucose blood (ONE TOUCH ULTRA TEST) test strip USE TO TEST BLOOD SUGAR DAILY 100 each 12   ibuprofen (ADVIL) 800 MG tablet Take 1 tablet (800 mg total) by mouth every 8 (eight) hours as needed. 90 tablet 5   Liraglutide -Weight Management (SAXENDA) 18 MG/3ML SOPN Inject 2.4 mg into the skin daily. 15 mL 6   lisinopril (ZESTRIL) 10 MG tablet Take 1 tablet (10 mg total) by mouth daily. 90 tablet 4   metFORMIN (GLUCOPHAGE) 500 MG tablet Take 1 tablet (500 mg total) by mouth daily with breakfast. 90 tablet 4   ONETOUCH DELICA LANCETS 33G MISC USE TO TEST BLOOD SUGAR DAILY 100 each 12   simvastatin (ZOCOR) 40 MG tablet Take 1 tablet (40 mg total) by mouth at bedtime. 90 tablet 4   valACYclovir (VALTREX) 1000 MG tablet TAKE 1 TABLET  BY MOUTH TWICE A DAY 60 tablet 2   No current facility-administered medications for this visit.    Patient confirms/reports the following allergies:  Allergies  Allergen Reactions   Jardiance [Empagliflozin]     Candidiasis    No orders of the defined types were placed in this encounter.   AUTHORIZATION INFORMATION Primary Insurance: 1D#: Group #:  Secondary Insurance: 1D#: Group #:  SCHEDULE INFORMATION: Date: 04/15/21 Time: Location: [facility]

## 2021-04-14 ENCOUNTER — Encounter: Payer: Self-pay | Admitting: Gastroenterology

## 2021-04-15 ENCOUNTER — Ambulatory Visit
Admission: RE | Admit: 2021-04-15 | Discharge: 2021-04-15 | Disposition: A | Discharge status: Home / Self Care | Payer: BC Managed Care – PPO | Attending: Gastroenterology | Admitting: Gastroenterology

## 2021-04-15 ENCOUNTER — Ambulatory Visit: Payer: BC Managed Care – PPO | Admitting: Registered Nurse

## 2021-04-15 ENCOUNTER — Encounter: Payer: Self-pay | Admitting: Gastroenterology

## 2021-04-15 ENCOUNTER — Encounter
Admission: RE | Disposition: A | Discharge status: Home / Self Care | Payer: Self-pay | Source: Home / Self Care | Attending: Gastroenterology

## 2021-04-15 DIAGNOSIS — Z7984 Long term (current) use of oral hypoglycemic drugs: Secondary | ICD-10-CM | POA: Diagnosis not present

## 2021-04-15 DIAGNOSIS — E119 Type 2 diabetes mellitus without complications: Secondary | ICD-10-CM | POA: Insufficient documentation

## 2021-04-15 DIAGNOSIS — Z6827 Body mass index (BMI) 27.0-27.9, adult: Secondary | ICD-10-CM | POA: Insufficient documentation

## 2021-04-15 DIAGNOSIS — E7800 Pure hypercholesterolemia, unspecified: Secondary | ICD-10-CM | POA: Diagnosis not present

## 2021-04-15 DIAGNOSIS — Z79899 Other long term (current) drug therapy: Secondary | ICD-10-CM | POA: Insufficient documentation

## 2021-04-15 DIAGNOSIS — F17210 Nicotine dependence, cigarettes, uncomplicated: Secondary | ICD-10-CM | POA: Insufficient documentation

## 2021-04-15 DIAGNOSIS — K635 Polyp of colon: Secondary | ICD-10-CM | POA: Diagnosis not present

## 2021-04-15 DIAGNOSIS — E669 Obesity, unspecified: Secondary | ICD-10-CM | POA: Diagnosis not present

## 2021-04-15 DIAGNOSIS — Z7982 Long term (current) use of aspirin: Secondary | ICD-10-CM | POA: Diagnosis not present

## 2021-04-15 DIAGNOSIS — Z1211 Encounter for screening for malignant neoplasm of colon: Secondary | ICD-10-CM | POA: Diagnosis not present

## 2021-04-15 DIAGNOSIS — Z888 Allergy status to other drugs, medicaments and biological substances status: Secondary | ICD-10-CM | POA: Diagnosis not present

## 2021-04-15 DIAGNOSIS — Z833 Family history of diabetes mellitus: Secondary | ICD-10-CM | POA: Insufficient documentation

## 2021-04-15 DIAGNOSIS — Z8249 Family history of ischemic heart disease and other diseases of the circulatory system: Secondary | ICD-10-CM | POA: Insufficient documentation

## 2021-04-15 HISTORY — PX: COLONOSCOPY WITH PROPOFOL: SHX5780

## 2021-04-15 SURGERY — COLONOSCOPY WITH PROPOFOL
Anesthesia: General

## 2021-04-15 MED ORDER — LIDOCAINE HCL (PF) 2 % IJ SOLN
INTRAMUSCULAR | Status: AC
  Filled 2021-04-15: qty 25

## 2021-04-15 MED ORDER — PROPOFOL 10 MG/ML IV BOLUS
INTRAVENOUS | Status: DC | PRN
  Administered 2021-04-15: 80 mg via INTRAVENOUS

## 2021-04-15 MED ORDER — PROPOFOL 500 MG/50ML IV EMUL
INTRAVENOUS | Status: AC
  Filled 2021-04-15: qty 250

## 2021-04-15 MED ORDER — PHENYLEPHRINE HCL (PRESSORS) 10 MG/ML IV SOLN
INTRAVENOUS | Status: AC
  Filled 2021-04-15: qty 1

## 2021-04-15 MED ORDER — PROPOFOL 500 MG/50ML IV EMUL
INTRAVENOUS | Status: DC | PRN
  Administered 2021-04-15: 150 ug/kg/min via INTRAVENOUS

## 2021-04-15 MED ORDER — SODIUM CHLORIDE 0.9 % IV SOLN
INTRAVENOUS | Status: DC
  Administered 2021-04-15: 1000 mL via INTRAVENOUS

## 2021-04-15 MED ORDER — LIDOCAINE HCL (CARDIAC) PF 100 MG/5ML IV SOSY
PREFILLED_SYRINGE | INTRAVENOUS | Status: DC | PRN
  Administered 2021-04-15: 100 mg via INTRAVENOUS

## 2021-04-15 NOTE — Anesthesia Postprocedure Evaluation (Signed)
Anesthesia Post Note  Patient: Holly Morales  Procedure(s) Performed: COLONOSCOPY WITH PROPOFOL  Patient location during evaluation: Endoscopy Anesthesia Type: General Level of consciousness: awake and alert Pain management: pain level controlled Vital Signs Assessment: post-procedure vital signs reviewed and stable Respiratory status: spontaneous breathing, nonlabored ventilation and respiratory function stable Cardiovascular status: blood pressure returned to baseline and stable Postop Assessment: no apparent nausea or vomiting Anesthetic complications: no   No notable events documented.   Last Vitals:  Vitals:   04/15/21 0817 04/15/21 0827  BP: 110/66 117/72  Pulse: 87 71  Resp: 13 16  Temp:    SpO2: 100% 98%    Last Pain:  Vitals:   04/15/21 0827  TempSrc:   PainSc: 0-No pain                 Foye Deer

## 2021-04-15 NOTE — Transfer of Care (Signed)
Immediate Anesthesia Transfer of Care Note  Patient: Holly Morales  Procedure(s) Performed: COLONOSCOPY WITH PROPOFOL  Patient Location: Endoscopy Unit  Anesthesia Type:General  Level of Consciousness: drowsy  Airway & Oxygen Therapy: Patient Spontanous Breathing  Post-op Assessment: Report given to RN and Post -op Vital signs reviewed and stable  Post vital signs: Reviewed and stable  Last Vitals:  Vitals Value Taken Time  BP    Temp    Pulse    Resp 23 04/15/21 0807  SpO2      Last Pain:  Vitals:   04/15/21 0708  TempSrc: Temporal  PainSc: 0-No pain         Complications: No notable events documented.

## 2021-04-15 NOTE — Anesthesia Preprocedure Evaluation (Addendum)
Anesthesia Evaluation   Patient awake    Reviewed: Allergy & Precautions, H&P , NPO status , Patient's Chart, lab work & pertinent test results  Airway Mallampati: III  TM Distance: >3 FB Neck ROM: full    Dental no notable dental hx. (+) Teeth Intact   Pulmonary neg pulmonary ROS, Current SmokerPatient did not abstain from smoking.,    Pulmonary exam normal        Cardiovascular Exercise Tolerance: Good hypertension, Normal cardiovascular exam+ Valvular Problems/Murmurs AI and MR      Neuro/Psych negative neurological ROS  negative psych ROS   GI/Hepatic Neg liver ROS, GERD  ,  Endo/Other  negative endocrine ROSdiabetes, Type 2  Renal/GU negative Renal ROS  negative genitourinary   Musculoskeletal  (+) Arthritis , Osteoarthritis,    Abdominal (+) + obese,   Peds  Hematology negative hematology ROS (+)   Anesthesia Other Findings Past Medical History: No date: Depression     Comment:  06/25/18 no longer an issue per pt  No date: Diabetes mellitus without complication (HCC) No date: High cholesterol No date: Obesity (BMI 30-39.9)  Past Surgical History: 07/07/2020: CARPAL TUNNEL RELEASE; Right 2009: LEEP No date: NO PAST SURGERIES     Reproductive/Obstetrics negative OB ROS                            Anesthesia Physical Anesthesia Plan  ASA: 3  Anesthesia Plan: General   Post-op Pain Management:    Induction: Intravenous  PONV Risk Score and Plan: Propofol infusion, TIVA and Treatment may vary due to age or medical condition  Airway Management Planned: Nasal Cannula and Natural Airway  Additional Equipment:   Intra-op Plan:   Post-operative Plan:   Informed Consent: I have reviewed the patients History and Physical, chart, labs and discussed the procedure including the risks, benefits and alternatives for the proposed anesthesia with the patient or authorized  representative who has indicated his/her understanding and acceptance.     Dental Advisory Given  Plan Discussed with: Anesthesiologist, CRNA and Surgeon  Anesthesia Plan Comments:         Anesthesia Quick Evaluation

## 2021-04-15 NOTE — H&P (Signed)
Wyline Mood, MD [address], [place], [place], [address] [address], [address], [place], [place], [address] Phone: [phone]  Fax: [phone]  Primary Care Physician:  Marjie Skiff, NP   Pre-Procedure History & Physical: HPI:  Holly Morales is a 50 y.o. female is here for an colonoscopy.   Past Medical History:  Diagnosis Date   Depression    06/25/18 no longer an issue per pt    Diabetes mellitus without complication (HCC)    High cholesterol    Obesity (BMI 30-39.9)     Past Surgical History:  Procedure Laterality Date   CARPAL TUNNEL RELEASE Right 07/07/2020   LEEP  2009   NO PAST SURGERIES      Prior to Admission medications   Medication Sig Start Date End Date Taking? Authorizing Provider  aspirin EC 81 MG tablet Take 81 mg by mouth daily. Swallow whole.   Yes [provider]  B-D ULTRAFINE III SHORT PEN 31G X 8 MM MISC USE ONE NEEDLE DAILY TO INJECT SAXENDA 01/13/21  Yes Cannady, Jolene T, NP  cyclobenzaprine (FLEXERIL) 5 MG tablet Take 1 tablet (5 mg total) by mouth 3 (three) times daily as needed for muscle spasms. 01/26/21  Yes Cannady, Jolene T, NP  glucose blood (ONE TOUCH ULTRA TEST) test strip USE TO TEST BLOOD SUGAR DAILY 11/30/17  Yes Crissman, Redge Gainer, MD  Liraglutide -Weight Management (SAXENDA) 18 MG/3ML SOPN Inject 2.4 mg into the skin daily. 01/26/21  Yes Cannady, Jolene T, NP  lisinopril (ZESTRIL) 10 MG tablet Take 1 tablet (10 mg total) by mouth daily. 01/26/21  Yes Cannady, Corrie Dandy T, NP  ONETOUCH DELICA LANCETS 33G MISC USE TO TEST BLOOD SUGAR DAILY 11/30/17  Yes Crissman, Redge Gainer, MD  simvastatin (ZOCOR) 40 MG tablet Take 1 tablet (40 mg total) by mouth at bedtime. 01/26/21  Yes Cannady, Jolene T, NP  ibuprofen (ADVIL) 800 MG tablet Take 1 tablet (800 mg total) by mouth every 8 (eight) hours as needed. 01/26/21   Aura Dials T, NP  metFORMIN (GLUCOPHAGE) 500 MG tablet Take 1 tablet (500 mg total) by mouth daily with  breakfast. Patient not taking: Reported on 03/03/2021 01/10/20   Aura Dials T, NP  valACYclovir (VALTREX) 1000 MG tablet TAKE 1 TABLET BY MOUTH TWICE A DAY 01/16/18   Steele Sizer, MD    Allergies as of 03/03/2021 - Review Complete 03/03/2021  Allergen Reaction Noted   Jardiance [empagliflozin]  06/21/2016    Family History  Problem Relation Age of Onset   Diabetes Father    Cancer Father        Prostate   Irregular heart beat Father    Breast cancer Neg Hx     Social History   Socioeconomic History   Marital status: Media planner    Spouse name: Not on file   Number of children: 0   Years of education: Not on file   Highest education level: High school graduate  Occupational History   Not on file  Tobacco Use   Smoking status: Every Day    Packs/day: 0.50    Types: Cigarettes    Last attempt to quit: 07/06/2019    Years since quitting: 1.7   Smokeless tobacco: Never  Vaping Use   Vaping Use: Never used  Substance and Sexual Activity   Alcohol use: Yes    Alcohol/week: 1.0 standard drink    Types: 1 Standard drinks or equivalent per week  Comment: on occasion   Drug use: No    Comment: 06/25/18--30 yrs ago smoked marijuana   Sexual activity: Yes  Other Topics Concern   Not on file  Social History Narrative   Lives at home alone   Right handed   Caffeine: 1 pot of coffee every morning, 1 cup of unsweet tea daily, lots of water, very seldom soda.   Social Determinants of Health   Financial Resource Strain: Not on file  Food Insecurity: Not on file  Transportation Needs: Not on file  Physical Activity: Not on file  Stress: Not on file  Social Connections: Not on file  Intimate Partner Violence: Not on file    Review of Systems: See HPI, otherwise negative ROS  Physical Exam: BP (!) 135/91   Pulse 85   Temp (!) 96.3 F (35.7 C) (Temporal)   Resp 18   Ht 5\' 7"  (1.702 m)   Wt 80 kg   LMP 11/06/2017 (Approximate)   SpO2 100%   BMI 27.62  kg/m  General:   Alert,  pleasant and cooperative in NAD Head:  Normocephalic and atraumatic. Neck:  Supple; no masses or thyromegaly. Lungs:  Clear throughout to auscultation, normal respiratory effort.    Heart:  +S1, +S2, Regular rate and rhythm, No edema. Abdomen:  Soft, nontender and nondistended. Normal bowel sounds, without guarding, and without rebound.   Neurologic:  Alert and  oriented x4;  grossly normal neurologically.  Impression/Plan: Holly Morales is here for an colonoscopy to be performed for Screening colonoscopy average risk   Risks, benefits, limitations, and alternatives regarding  colonoscopy have been reviewed with the patient.  Questions have been answered.  All parties agreeable.   Ronnald Nian, MD  04/15/2021, 7:45 AM

## 2021-04-15 NOTE — Op Note (Signed)
[facility] Gastroenterology Patient Name: Holly Morales Procedure Date: 04/15/2021 7:27 AM MRN: [id number] Account #: [id number] Date of Birth: 1971-07-01 Admit Type: Outpatient Age: 50 Room: [facility] ENDO ROOM 3 Gender: Female Note Status: Finalized Instrument Name: Nelda Marseille [id number] Procedure:             Colonoscopy Indications:           Screening for colorectal malignant neoplasm Providers:             Wyline Mood MD, MD Referring MD:          Dorie Rank. Harvest Dark (Referring MD) Medicines:             Monitored Anesthesia Care Complications:         No immediate complications. Procedure:             Pre-Anesthesia Assessment:                        - Prior to the procedure, a History and Physical was                         performed, and patient medications, allergies and                         sensitivities were reviewed. The patient's tolerance                         of previous anesthesia was reviewed.                        - The risks and benefits of the procedure and the                         sedation options and risks were discussed with the                         patient. All questions were answered and informed                         consent was obtained.                        - ASA Grade Assessment: II - A patient with mild                         systemic disease.                        After obtaining informed consent, the colonoscope was                         passed under direct vision. Throughout the procedure,                         the patient's blood pressure, pulse, and oxygen                         saturations were monitored continuously. The                         Colonoscope was introduced  through the anus and                         advanced to the the cecum, identified by the                         appendiceal orifice. The colonoscopy was performed                         with ease. The patient tolerated the procedure well.                          The quality of the bowel preparation was good. Findings:      The perianal and digital rectal examinations were normal.      A 5 mm polyp was found in the transverse colon. The polyp was sessile.       The polyp was removed with a cold snare. Resection and retrieval were       complete.      The exam was otherwise without abnormality on direct and retroflexion       views. Impression:            - One 5 mm polyp in the transverse colon, removed with                         a cold snare. Resected and retrieved.                        - The examination was otherwise normal on direct and                         retroflexion views. Recommendation:        - Discharge patient to home (with escort).                        - Resume previous diet.                        - Continue present medications.                        - Await pathology results.                        - Repeat colonoscopy for surveillance based on                         pathology results. Procedure Code(s):     --- Professional ---                        [phone], Colonoscopy, flexible; with removal of                         tumor(s), polyp(s), or other lesion(s) by snare                         technique Diagnosis Code(s):     --- Professional ---  Z12.11, Encounter for screening for malignant neoplasm                         of colon                        K63.5, Polyp of colon CPT copyright 2019 [employer]. All rights reserved. The codes documented in this report are preliminary and upon coder review may  be revised to meet current compliance requirements. Wyline Mood, MD Wyline Mood MD, MD 04/15/2021 8:04:54 AM This report has been signed electronically. Number of Addenda: 0 Note Initiated On: 04/15/2021 7:27 AM Scope Withdrawal Time: 0 hours 8 minutes 57 seconds  Total Procedure Duration: 0 hours 12 minutes 8 seconds  Estimated Blood Loss:  Estimated blood  loss: none.      [facility]

## 2021-04-16 ENCOUNTER — Encounter: Payer: Self-pay | Admitting: Gastroenterology

## 2021-04-16 LAB — SURGICAL PATHOLOGY

## 2021-04-26 ENCOUNTER — Other Ambulatory Visit: Payer: Self-pay | Admitting: Nurse Practitioner

## 2021-04-26 NOTE — Telephone Encounter (Signed)
Requested medications are due for refill today.  yes  Requested medications are on the active medications list.  yes  Last refill. 01/26/2021  Future visit scheduled.   yes  Notes to clinic.  Medication not delegated. 

## 2021-04-27 ENCOUNTER — Ambulatory Visit: Payer: BC Managed Care – PPO | Admitting: Nurse Practitioner

## 2021-04-27 ENCOUNTER — Encounter: Payer: Self-pay | Admitting: Nurse Practitioner

## 2021-04-27 ENCOUNTER — Other Ambulatory Visit: Payer: Self-pay

## 2021-04-27 VITALS — BP 103/68 | HR 73 | Temp 98.9°F | Wt 182.2 lb

## 2021-04-27 DIAGNOSIS — E785 Hyperlipidemia, unspecified: Secondary | ICD-10-CM

## 2021-04-27 DIAGNOSIS — Z6827 Body mass index (BMI) 27.0-27.9, adult: Secondary | ICD-10-CM

## 2021-04-27 DIAGNOSIS — R8281 Pyuria: Secondary | ICD-10-CM

## 2021-04-27 DIAGNOSIS — E1140 Type 2 diabetes mellitus with diabetic neuropathy, unspecified: Secondary | ICD-10-CM

## 2021-04-27 DIAGNOSIS — E1159 Type 2 diabetes mellitus with other circulatory complications: Secondary | ICD-10-CM | POA: Diagnosis not present

## 2021-04-27 DIAGNOSIS — I152 Hypertension secondary to endocrine disorders: Secondary | ICD-10-CM

## 2021-04-27 DIAGNOSIS — R300 Dysuria: Secondary | ICD-10-CM | POA: Diagnosis not present

## 2021-04-27 DIAGNOSIS — Z23 Encounter for immunization: Secondary | ICD-10-CM | POA: Diagnosis not present

## 2021-04-27 DIAGNOSIS — Z1159 Encounter for screening for other viral diseases: Secondary | ICD-10-CM

## 2021-04-27 DIAGNOSIS — R87612 Low grade squamous intraepithelial lesion on cytologic smear of cervix (LGSIL): Secondary | ICD-10-CM

## 2021-04-27 DIAGNOSIS — E1169 Type 2 diabetes mellitus with other specified complication: Secondary | ICD-10-CM | POA: Diagnosis not present

## 2021-04-27 DIAGNOSIS — F17210 Nicotine dependence, cigarettes, uncomplicated: Secondary | ICD-10-CM

## 2021-04-27 LAB — MICROSCOPIC EXAMINATION

## 2021-04-27 LAB — URINALYSIS, ROUTINE W REFLEX MICROSCOPIC
Bilirubin, UA: NEGATIVE
Glucose, UA: NEGATIVE
Ketones, UA: NEGATIVE
Nitrite, UA: NEGATIVE
Protein,UA: NEGATIVE
Specific Gravity, UA: 1.015 (ref 1.005–1.030)
Urobilinogen, Ur: 0.2 mg/dL (ref 0.2–1.0)
pH, UA: 6.5 (ref 5.0–7.5)

## 2021-04-27 LAB — BAYER DCA HB A1C WAIVED: HB A1C (BAYER DCA - WAIVED): 5.9 % — ABNORMAL HIGH (ref 4.8–5.6)

## 2021-04-27 MED ORDER — AMOXICILLIN-POT CLAVULANATE 875-125 MG PO TABS: 1.0000 | ORAL_TABLET | Freq: Two times a day (BID) | ORAL | 0 refills | Status: AC

## 2021-04-27 NOTE — Assessment & Plan Note (Signed)
I have recommended complete cessation of tobacco use. I have discussed various options available for assistance with tobacco cessation including over the counter methods (Nicotine gum, patch and lozenges). We also discussed prescription options (Chantix, Nicotine Inhaler / Nasal Spray). The patient is not interested in pursuing any prescription tobacco cessation options at this time.  She is interested in lung cancer screening, will place referral today.

## 2021-04-27 NOTE — Assessment & Plan Note (Addendum)
Chronic, stable.  Continue current medication regimen and adjust as needed.  Lipid panel next visit. 

## 2021-04-27 NOTE — Patient Instructions (Signed)

## 2021-04-27 NOTE — Progress Notes (Addendum)
BP 103/68   Pulse 73   Temp 98.9 F (37.2 C) (Oral)   Wt 182 lb 3.2 oz (82.6 kg)   LMP 11/06/2017 (Approximate)   SpO2 99%   BMI 28.54 kg/m    Subjective:    Patient ID: Holly Morales, female    DOB: 05/27/1971, 50 y.o.   MRN: [id number]  HPI: Holly Morales is a 50 y.o. female  Chief Complaint  Patient presents with   Diabetes    Patient is here for her 3 month follow up.   Hypertension   Urinary Tract Infection    Patient states she thinks she may have a UTI. She has the sense of urinary frequency and burning with urination. Patient first notice symptoms last week. Patient states it has gotten a little better.    History of abnormal pap on 01/07/2019 -- was referred to GYN, but today she reports never attending -- referral states she was seen August 2020, but no notes on this.  DIABETES Recent A1C 5.8% in June. Continues on Saxenda 0.8 MG weekly, not taking Metformin due to good control past visits.   Hypoglycemic episodes:no Polydipsia/polyuria: no Visual disturbance: no Chest pain: no Paresthesias: no Glucose Monitoring: yes             Accucheck frequency: every other day             Fasting glucose: 118-130 range             Post prandial:             Evening:             Before meals: Taking Insulin?: no             Long acting insulin:             Short acting insulin: Blood Pressure Monitoring: not checking Retinal Examination: Up To Date Foot Exam: Up to Date Pneumovax: Up to Date Influenza: Up to Date Aspirin: no, is going to start taking   HYPERTENSION / HYPERLIPIDEMIA Continues on Simvastatin for HLD and Lisinopril for HTN. Continues to smoke at this time 1/2 to 1 PPD -- started back to smoking 2011, has been smoking for about 30 years.  Was 11 when started smoking.   Satisfied with current treatment? yes Duration of hypertension: chronic BP monitoring frequency: not checking BP range:  BP medication side effects: no Duration of hyperlipidemia:  chronic Cholesterol medication side effects: no Cholesterol supplements: none Medication compliance: good compliance Aspirin: no Recent stressors: no Recurrent headaches: no Visual changes: no Palpitations: no Dyspnea: no Chest pain: no Lower extremity edema: no Dizzy/lightheaded: no  URINARY SYMPTOMS Started with symptoms about 1 to 1 1/2 weeks ago. Dysuria: burning Urinary frequency: yes Urgency: yes Small volume voids: yes Symptom severity: yes Urinary incontinence: no Foul odor: no Hematuria: no Abdominal pain: no Back pain: no Suprapubic pain/pressure: no Flank pain: no Fever:  no Vomiting: no Relief with cranberry juice: yes Relief with pyridium: no Status: stable Previous urinary tract infection: yes Recurrent urinary tract infection: no Sexual activity: monogomous History of sexually transmitted disease:  Treatments attempted: increased fluids    Relevant past medical, surgical, family and social history reviewed and updated as indicated. Interim medical history since our last visit reviewed. Allergies and medications reviewed and updated.  Review of Systems  Constitutional:  Negative for activity change, appetite change, diaphoresis, fatigue and fever.  Respiratory:  Negative for cough, chest tightness and shortness of  breath.   Cardiovascular:  Negative for chest pain, palpitations and leg swelling.  Gastrointestinal:  Negative for abdominal distention, abdominal pain, constipation, diarrhea, nausea and vomiting.  Endocrine: Negative for cold intolerance, heat intolerance, polydipsia, polyphagia and polyuria.  Neurological:  Negative for dizziness, syncope, weakness, light-headedness, numbness and headaches.  Psychiatric/Behavioral: Negative.     Per HPI unless specifically indicated above     Objective:    BP 103/68   Pulse 73   Temp 98.9 F (37.2 C) (Oral)   Wt 182 lb 3.2 oz (82.6 kg)   LMP 11/06/2017 (Approximate)   SpO2 99%   BMI 28.54  kg/m   Wt Readings from Last 3 Encounters:  04/27/21 182 lb 3.2 oz (82.6 kg)  04/15/21 176 lb 5.9 oz (80 kg)  01/25/21 175 lb 9.6 oz (79.7 kg)    Physical Exam Vitals and nursing note reviewed.  Constitutional:      General: She is awake. She is not in acute distress.    Appearance: She is well-developed and overweight. She is not ill-appearing.  HENT:     Head: Normocephalic.     Right Ear: Hearing normal.     Left Ear: Hearing normal.  Eyes:     General: Lids are normal.        Right eye: No discharge.        Left eye: No discharge.     Conjunctiva/sclera: Conjunctivae normal.     Pupils: Pupils are equal, round, and reactive to light.  Neck:     Thyroid: No thyromegaly.     Vascular: No carotid bruit.  Cardiovascular:     Rate and Rhythm: Normal rate and regular rhythm.     Heart sounds: Normal heart sounds. No murmur heard.   No gallop.  Pulmonary:     Effort: Pulmonary effort is normal. No accessory muscle usage or respiratory distress.     Breath sounds: Normal breath sounds.  Abdominal:     General: Bowel sounds are normal. There is no distension.     Palpations: Abdomen is soft.     Tenderness: There is no abdominal tenderness. There is no right CVA tenderness or left CVA tenderness.  Musculoskeletal:     Cervical back: Normal range of motion and neck supple.     Right lower leg: No edema.     Left lower leg: No edema.  Lymphadenopathy:     Cervical: No cervical adenopathy.  Skin:    General: Skin is warm and dry.  Neurological:     Mental Status: She is alert and oriented to person, place, and time.  Psychiatric:        Attention and Perception: Attention normal.        Mood and Affect: Mood normal.        Behavior: Behavior normal. Behavior is cooperative.        Thought Content: Thought content normal.        Judgment: Judgment normal.  Diabetic Foot Exam - Simple   Simple Foot Form Visual Inspection No deformities, no ulcerations, no other skin  breakdown bilaterally: Yes Sensation Testing Intact to touch and monofilament testing bilaterally: Yes Pulse Check Posterior Tibialis and Dorsalis pulse intact bilaterally: Yes Comments     Results for orders placed or performed during the hospital encounter of 04/15/21  Surgical pathology  Result Value Ref Range   SURGICAL PATHOLOGY      SURGICAL PATHOLOGY CASE: [id number] PATIENT: Hilaria Ota Surgical Pathology Report  Specimen Submitted: A. Colon polyp, transverse; cold snare  Clinical History: Colon cancer screening Z12.11.  Polyps      DIAGNOSIS: A. COLON POLYP, TRANSVERSE; COLD SNARE: - FEATURES SUGGESTIVE OF HYPERPLASTIC POLYP. - NEGATIVE FOR DYSPLASIA AND MALIGNANCY.   GROSS DESCRIPTION: A. Labeled: Cold snare transverse colon polyp Received: Formalin Collection time: 7:58 AM on 04/15/2021 Placed into formalin time: 7:58 AM on 04/15/2021 Tissue fragment(s): 1 Size: 0.3 x 0.2 x 0.1 cm Description: Tan-white soft tissue fragment Entirely submitted in 1 cassette.  RB 04/15/2021  Final Diagnosis performed by Georgeanna Harrison, MD.   Electronically signed 04/16/2021 10:28:23AM The electronic signature indicates that the named Attending Pathologist has evaluated the specimen Technical component performed at [facility], [address], [place], [place] [address] Lab: [phone] Dir: Valor Quaintance Schimke, MD, MMM  Professional component performed at [facility], [address] [place], [place], [place] [address] Lab: [phone] Dir: Georgiann Cocker. Rubinas, MD       Assessment & Plan:   Problem List Items Addressed This Visit       Cardiovascular and Mediastinum   Hypertension associated with diabetes (HCC)    Chronic, stable with BP at goal in office today.  Continue Lisinopril, which offers kidney protection.  Urine micro ALB 10 and A:C <30 last check -- recheck next visit.  Recommend she check BP at home at least 3 mornings a week and focus on  DASH diet.  Obtain BMP today.  Return in 6 months.      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  Continue current medication regimen and adjust as needed.  Recommend she continue to monitor BS at home with goal <130 in morning and <180 two hours after a meal - if elevations she is to notify provider and we may restart Metformin.  At this time she wishes to maintain regimen due to benefit with weight.  Return in 6 months as A1C has been well-controlled for well over a year.      Hyperlipidemia associated with type 2 diabetes mellitus (HCC)    Chronic, stable.  Continue current medication regimen and adjust as needed.  Lipid panel next visit.        Other   BMI 27.0-27.9,adult    Recommended eating smaller high protein, low fat meals more frequently and exercising 30 mins a day 5 times a week with a goal of 10-15lb weight loss in the next 3 months. Patient voiced their understanding and motivation to adhere to these recommendations.       Pap smear abnormality of cervix with LGSIL    She does not recall attending GYN as was instructed -- will place new referral today and highly recommend she attend as needs repeat pap and possible colposcopy.      Relevant Orders   Ambulatory referral to Gynecology   Nicotine dependence, cigarettes, uncomplicated    I have recommended complete cessation of tobacco use. I have discussed various options available for assistance with tobacco cessation including over the counter methods (Nicotine gum, patch and lozenges). We also discussed prescription options (Chantix, Nicotine Inhaler / Nasal Spray). The patient is not interested in pursuing any prescription tobacco cessation options at this time.  She is interested in lung cancer screening, will place referral today.  Relevant Orders   Ambulatory Referral for Lung Cancer Scre   Other Visit Diagnoses     Dysuria       UA noting 2+ BLD, 3+ LEU, few bacteria, sent for culture and start Augmentin, adjust as needed based on culture results.   Relevant Orders   Urinalysis, Routine w reflex microscopic   Pyuria       Urine for culture.   Relevant Orders   Urine Culture   Flu vaccine need       Flu vaccine today   Relevant Orders   Flu Vaccine QUAD 6+ mos PF IM (Fluarix Quad PF) (Completed)   Need for hepatitis C screening test       Hep C screening today per guidelines, discussed with patient.   Relevant Orders   Hepatitis C antibody        Follow up plan: Return in about 6 months (around 10/25/2021) for T2DM, HTN/HLD.

## 2021-04-27 NOTE — Assessment & Plan Note (Signed)
Recommended eating smaller high protein, low fat meals more frequently and exercising 30 mins a day 5 times a week with a goal of 10-15lb weight loss in the next 3 months. Patient voiced their understanding and motivation to adhere to these recommendations.  

## 2021-04-27 NOTE — Assessment & Plan Note (Signed)
Chronic, stable with current A1C 5.9% today, mild increase but remaining at goal.  Continue current medication regimen and adjust as needed.  Recommend she continue to monitor BS at home with goal <130 in morning and <180 two hours after a meal - if elevations she is to notify provider and we may restart Metformin.  At this time she wishes to maintain regimen due to benefit with weight.  Return in 6 months as A1C has been well-controlled for well over a year.

## 2021-04-27 NOTE — Assessment & Plan Note (Signed)
Chronic, stable with BP at goal in office today.  Continue Lisinopril, which offers kidney protection.  Urine micro ALB 10 and A:C <30 last check -- recheck next visit.  Recommend she check BP at home at least 3 mornings a week and focus on DASH diet.  Obtain BMP today.  Return in 6 months.

## 2021-04-27 NOTE — Assessment & Plan Note (Signed)
She does not recall attending GYN as was instructed -- will place new referral today and highly recommend she attend as needs repeat pap and possible colposcopy.

## 2021-04-28 ENCOUNTER — Encounter: Payer: Self-pay | Admitting: Gastroenterology

## 2021-04-28 LAB — BASIC METABOLIC PANEL
BUN/Creatinine Ratio: 30 — ABNORMAL HIGH (ref 9–23)
BUN: 18 mg/dL (ref 6–24)
CO2: 26 mmol/L (ref 20–29)
Calcium: 9.7 mg/dL (ref 8.7–10.2)
Chloride: 101 mmol/L (ref 96–106)
Creatinine, Ser: 0.6 mg/dL (ref 0.57–1.00)
Glucose: 109 mg/dL — ABNORMAL HIGH (ref 65–99)
Potassium: 4.3 mmol/L (ref 3.5–5.2)
Sodium: 141 mmol/L (ref 134–144)
eGFR: 109 mL/min/{1.73_m2} (ref >59)

## 2021-04-28 LAB — HEPATITIS C ANTIBODY: Hep C Virus Ab: <0.1 s/co ratio (ref 0.0–0.9)

## 2021-04-28 NOTE — Progress Notes (Signed)
Good afternoon crew, please let Holly Morales know her labs have returned and overall remain stable.  No changes needed.  Keep up the good work!! Keep being amazing!!  Thank you for allowing me to participate in your care.  I appreciate you. Kindest regards, Cayle Cordoba

## 2021-05-01 LAB — URINE CULTURE

## 2021-05-01 NOTE — Progress Notes (Signed)
Please let Kennith Center know her urine came back showing no growth, your can stop taking the antibiotic if you have not completed course.  All was clear.  Any questions? Keep being awesome!!  Thank you for allowing me to participate in your care.  I appreciate you. Kindest regards, Lisabeth Mian

## 2021-05-17 ENCOUNTER — Telehealth (INDEPENDENT_AMBULATORY_CARE_PROVIDER_SITE_OTHER): Payer: BC Managed Care – PPO | Admitting: Nurse Practitioner

## 2021-05-17 ENCOUNTER — Encounter: Payer: Self-pay | Admitting: Nurse Practitioner

## 2021-05-17 VITALS — Ht 67.0 in | Wt 180.0 lb

## 2021-05-17 DIAGNOSIS — J0100 Acute maxillary sinusitis, unspecified: Secondary | ICD-10-CM

## 2021-05-17 DIAGNOSIS — N951 Menopausal and female climacteric states: Secondary | ICD-10-CM | POA: Insufficient documentation

## 2021-05-17 MED ORDER — PREDNISONE 10 MG PO TABS: 10.0000 mg | ORAL_TABLET | Freq: Every day | ORAL | 0 refills | Status: DC

## 2021-05-17 MED ORDER — AMOXICILLIN-POT CLAVULANATE 875-125 MG PO TABS: 1.0000 | ORAL_TABLET | Freq: Two times a day (BID) | ORAL | 0 refills | Status: AC

## 2021-05-17 NOTE — Progress Notes (Signed)
Ht '5\' 7"'$  (1.702 m)   Wt 180 lb (81.6 kg)   LMP 11/06/2017 (Approximate)   BMI 28.19 kg/m    Subjective:    Patient ID: Holly Morales, female    DOB: 14-Feb-1971, 50 y.o.   MRN: [id number]  HPI: Holly Morales is a 50 y.o. female  Chief Complaint  Patient presents with   Sinus Problem   UPPER RESPIRATORY TRACT INFECTION Worst symptom: Fever: no Cough: yes Shortness of breath: yes Wheezing: yes Chest pain: no Chest tightness: no Chest congestion: yes Nasal congestion: yes Runny nose: yes Post nasal drip: yes Sneezing: yes Sore throat: yes Swollen glands: yes Sinus pressure: yes Headache: yes Face pain: yes Toothache: no Ear pain: no bilateral Ear pressure: yes bilateral Eyes red/itching:no Eye drainage/crusting: no  Vomiting: no Rash: no Fatigue: yes Sick contacts: no Strep contacts: no  Context: worse Recurrent sinusitis: no Relief with OTC cold/cough medications: yes  Treatments attempted:  Mucinex x 3 days, nyquil to help with cough     Relevant past medical, surgical, family and social history reviewed and updated as indicated. Interim medical history since our last visit reviewed. Allergies and medications reviewed and updated.  Review of Systems  Constitutional:  Positive for fatigue. Negative for fever.  HENT:  Positive for congestion, postnasal drip, rhinorrhea, sinus pressure, sinus pain, sneezing and sore throat. Negative for dental problem and ear pain.   Respiratory:  Positive for cough, shortness of breath and wheezing.   Cardiovascular:  Positive for chest pain.  Gastrointestinal:  Negative for vomiting.  Skin:  Negative for rash.  Neurological:  Negative for headaches.   Per HPI unless specifically indicated above     Objective:    Ht $R'5\' 7"'nB$  (1.702 m)   Wt 180 lb (81.6 kg)   LMP 11/06/2017 (Approximate)   BMI 28.19 kg/m   Wt Readings from Last 3 Encounters:  05/17/21 180 lb (81.6 kg)  04/27/21 182 lb 3.2 oz (82.6 kg)  04/15/21  176 lb 5.9 oz (80 kg)    Physical Exam Vitals and nursing note reviewed.  Constitutional:      General: She is not in acute distress.    Appearance: She is not ill-appearing.  HENT:     Head: Normocephalic.     Right Ear: Hearing normal.     Left Ear: Hearing normal.     Nose: Nose normal.  Pulmonary:     Effort: Pulmonary effort is normal. No respiratory distress.  Neurological:     Mental Status: She is alert.  Psychiatric:        Mood and Affect: Mood normal.        Behavior: Behavior normal.        Thought Content: Thought content normal.        Judgment: Judgment normal.    Results for orders placed or performed in visit on 04/27/21  Urine Culture   Specimen: Urine   UR  Result Value Ref Range   Urine Culture, Routine Final report    Organism ID, Bacteria Comment   Microscopic Examination   BLD  Result Value Ref Range   WBC, UA 11-30 (A) 0 - 5 /hpf   RBC 3-10 (A) 0 - 2 /hpf   Epithelial Cells (non renal) 0-10 0 - 10 /hpf   Mucus, UA Present (A) Not Estab.   Bacteria, UA Few (A) None seen/Few  Bayer DCA Hb A1c Waived  Result Value Ref Range   HB A1C (BAYER DCA -  WAIVED) 5.9 (H) 4.8 - 5.6 %  Basic metabolic panel  Result Value Ref Range   Glucose 109 (H) 65 - 99 mg/dL   BUN 18 6 - 24 mg/dL   Creatinine, Ser 0.60 0.57 - 1.00 mg/dL   eGFR 109 >59 mL/min/1.73   BUN/Creatinine Ratio 30 (H) 9 - 23   Sodium 141 134 - 144 mmol/L   Potassium 4.3 3.5 - 5.2 mmol/L   Chloride 101 96 - 106 mmol/L   CO2 26 20 - 29 mmol/L   Calcium 9.7 8.7 - 10.2 mg/dL  Hepatitis C antibody  Result Value Ref Range   Hep C Virus Ab <0.1 0.0 - 0.9 s/co ratio  Urinalysis, Routine w reflex microscopic  Result Value Ref Range   Specific Gravity, UA 1.015 1.005 - 1.030   pH, UA 6.5 5.0 - 7.5   Color, UA Yellow Yellow   Appearance Ur Cloudy (A) Clear   Leukocytes,UA 3+ (A) Negative   Protein,UA Negative Negative/Trace   Glucose, UA Negative Negative   Ketones, UA Negative Negative    RBC, UA 2+ (A) Negative   Bilirubin, UA Negative Negative   Urobilinogen, Ur 0.2 0.2 - 1.0 mg/dL   Nitrite, UA Negative Negative   Microscopic Examination See below:       Assessment & Plan:   Problem List Items Addressed This Visit   None Visit Diagnoses     Acute non-recurrent maxillary sinusitis    -  Primary   Augumentin and prednisone sent to the pharmacy. Continue with OTC medications for symptoms management. Use Tylenol/Ibuprofen for Fever/pain.    Relevant Medications   amoxicillin-clavulanate (AUGMENTIN) 875-125 MG tablet   predniSONE (DELTASONE) 10 MG tablet        Follow up plan: Return if symptoms worsen or fail to improve.   This visit was completed via MyChart due to the restrictions of the COVID-19 pandemic. All issues as above were discussed and addressed. Physical exam was done as above through visual confirmation on MyChart. If it was felt that the patient should be evaluated in the office, they were directed there. The patient verbally consented to this visit. Location of the patient: Home Location of the provider: Office Those involved with this call:  Provider: Jon Billings, NP CMA: Elizabeth Palau, CMA Front Desk/Registration: Myrlene Broker This encounter was conducted via Video.  I spent 15 dedicated to the care of this patient on the date of this encounter to include previsit review of 20, face to face time with the patient, and post visit ordering of testing.

## 2021-05-24 ENCOUNTER — Telehealth: Payer: Self-pay

## 2021-05-24 NOTE — Telephone Encounter (Signed)
Copied from CRM [id number]. Topic: General - Other >> May 24, 2021  8:23 AM Jaquita Rector A wrote: Reason for CRM: Patient called in to inform Aura Dials that she have taken her antibiotics and the Prednisone but still not feeling any better. Asking Jolene if there is something that she can get for the cough. Per patient cough is deep and wont break up and feeling strangled when coughing. Would like a call back please ASAP  Ph# [phone]   Routing to provider to advise.

## 2021-05-25 NOTE — Telephone Encounter (Signed)
Patient notified and appointment scheduled for tomorrow.

## 2021-05-26 ENCOUNTER — Ambulatory Visit
Admission: RE | Admit: 2021-05-26 | Discharge: 2021-05-26 | Disposition: A | Discharge status: Home / Self Care | Payer: BC Managed Care – PPO | Source: Home / Self Care | Attending: Nurse Practitioner | Admitting: Nurse Practitioner

## 2021-05-26 ENCOUNTER — Ambulatory Visit
Admission: RE | Admit: 2021-05-26 | Discharge: 2021-05-26 | Disposition: A | Discharge status: Home / Self Care | Payer: BC Managed Care – PPO | Source: Ambulatory Visit | Attending: Nurse Practitioner | Admitting: Nurse Practitioner

## 2021-05-26 ENCOUNTER — Encounter: Payer: Self-pay | Admitting: Nurse Practitioner

## 2021-05-26 ENCOUNTER — Ambulatory Visit: Payer: BC Managed Care – PPO | Admitting: Nurse Practitioner

## 2021-05-26 ENCOUNTER — Other Ambulatory Visit: Payer: Self-pay

## 2021-05-26 VITALS — BP 122/73 | HR 75 | Ht 67.0 in | Wt 180.0 lb

## 2021-05-26 DIAGNOSIS — R0602 Shortness of breath: Secondary | ICD-10-CM | POA: Diagnosis present

## 2021-05-26 DIAGNOSIS — J0100 Acute maxillary sinusitis, unspecified: Secondary | ICD-10-CM

## 2021-05-26 MED ORDER — PREDNISONE 10 MG PO TABS: 10.0000 mg | ORAL_TABLET | Freq: Every day | ORAL | 0 refills | Status: DC

## 2021-05-26 MED ORDER — ALBUTEROL SULFATE HFA 108 (90 BASE) MCG/ACT IN AERS
2.0000 | INHALATION_SPRAY | Freq: Four times a day (QID) | RESPIRATORY_TRACT | 0 refills | Status: DC | PRN
Start: 2021-05-26 — End: 2021-11-05

## 2021-05-26 MED ORDER — DOXYCYCLINE HYCLATE 100 MG PO TABS: 100.0000 mg | ORAL_TABLET | Freq: Two times a day (BID) | ORAL | 0 refills | Status: DC

## 2021-05-26 MED ORDER — HYDROCOD POLST-CPM POLST ER 10-8 MG/5ML PO SUER: 5.0000 mL | Freq: Two times a day (BID) | ORAL | 0 refills | Status: DC | PRN

## 2021-05-26 MED ORDER — BENZONATATE 200 MG PO CAPS: 200.0000 mg | ORAL_CAPSULE | Freq: Two times a day (BID) | ORAL | 0 refills | Status: DC | PRN

## 2021-05-26 NOTE — Progress Notes (Signed)
BP 122/73   Pulse 75   Ht _0  (1.702 m)   Wt 180 lb (81.6 kg)   LMP 11/06/2017 (Approximate)   BMI 28.19 kg/m    Subjective:    Patient ID: Holly Morales, female    DOB: 07-Aug-1971, 50 y.o.   MRN: [id number]  HPI: Holly Morales is a 50 y.o. female  Chief Complaint  Patient presents with   Cough   UPPER RESPIRATORY TRACT INFECTION Worst symptom: Fever: no Cough: yes Shortness of breath: no Wheezing: yes Chest pain: no Chest tightness: no Chest congestion: yes Nasal congestion: yes Runny nose: yes Post nasal drip: yes Sneezing: yes Sore throat: yes Swollen glands: yes Sinus pressure: yes Headache: yes Face pain: yes Toothache: no Ear pain: no bilateral Ear pressure: yes left Eyes red/itching:no Eye drainage/crusting: no  Vomiting: no Rash: no Fatigue: yes Sick contacts: no Strep contacts: no  Context: stable Recurrent sinusitis: no Relief with OTC cold/cough medications: no  Treatments attempted:  prendisone and antibiotics   Relevant past medical, surgical, family and social history reviewed and updated as indicated. Interim medical history since our last visit reviewed. Allergies and medications reviewed and updated.  Review of Systems  Constitutional:  Positive for fatigue. Negative for fever.  HENT:  Positive for congestion, postnasal drip, rhinorrhea, sinus pressure, sinus pain, sneezing and sore throat. Negative for dental problem and ear pain.   Respiratory:  Positive for cough, shortness of breath and wheezing.   Cardiovascular:  Negative for chest pain.  Gastrointestinal:  Negative for vomiting.  Skin:  Negative for rash.  Neurological:  Positive for headaches.   Per HPI unless specifically indicated above     Objective:    BP 122/73   Pulse 75   Ht _1  (1.702 m)   Wt 180 lb (81.6 kg)   LMP 11/06/2017 (Approximate)   BMI 28.19 kg/m   Wt Readings from Last 3 Encounters:  05/26/21 180 lb (81.6 kg)  05/17/21 180 lb (81.6 kg)   04/27/21 182 lb 3.2 oz (82.6 kg)    Physical Exam Vitals and nursing note reviewed.  Constitutional:      General: She is not in acute distress.    Appearance: Normal appearance. She is normal weight. She is not ill-appearing, toxic-appearing or diaphoretic.  HENT:     Head: Normocephalic.     Right Ear: External ear normal. No tenderness. A middle ear effusion is present. Tympanic membrane is not erythematous.     Left Ear: External ear normal. No tenderness. A middle ear effusion is present. Tympanic membrane is not erythematous.     Nose: Congestion present.     Mouth/Throat:     Mouth: Mucous membranes are moist.     Pharynx: Oropharynx is clear.  Eyes:     General:        Right eye: No discharge.        Left eye: No discharge.     Extraocular Movements: Extraocular movements intact.     Conjunctiva/sclera: Conjunctivae normal.     Pupils: Pupils are equal, round, and reactive to light.  Cardiovascular:     Rate and Rhythm: Normal rate and regular rhythm.     Heart sounds: No murmur heard. Pulmonary:     Effort: Pulmonary effort is normal. No respiratory distress.     Breath sounds: Wheezing present. No rales.  Musculoskeletal:     Cervical back: Normal range of motion and neck supple.  Skin:  General: Skin is warm and dry.     Capillary Refill: Capillary refill takes less than 2 seconds.  Neurological:     General: No focal deficit present.     Mental Status: She is alert and oriented to person, place, and time. Mental status is at baseline.  Psychiatric:        Mood and Affect: Mood normal.        Behavior: Behavior normal.        Thought Content: Thought content normal.        Judgment: Judgment normal.    Results for orders placed or performed in visit on 04/27/21  Urine Culture   Specimen: Urine   UR  Result Value Ref Range   Urine Culture, Routine Final report    Organism ID, Bacteria Comment   Microscopic Examination   BLD  Result Value Ref Range    WBC, UA 11-30 (A) 0 - 5 /hpf   RBC 3-10 (A) 0 - 2 /hpf   Epithelial Cells (non renal) 0-10 0 - 10 /hpf   Mucus, UA Present (A) Not Estab.   Bacteria, UA Few (A) None seen/Few  Bayer DCA Hb A1c Waived  Result Value Ref Range   HB A1C (BAYER DCA - WAIVED) 5.9 (H) 4.8 - 5.6 %  Basic metabolic panel  Result Value Ref Range   Glucose 109 (H) 65 - 99 mg/dL   BUN 18 6 - 24 mg/dL   Creatinine, Ser 0.60 0.57 - 1.00 mg/dL   eGFR 109 >59 mL/min/1.73   BUN/Creatinine Ratio 30 (H) 9 - 23   Sodium 141 134 - 144 mmol/L   Potassium 4.3 3.5 - 5.2 mmol/L   Chloride 101 96 - 106 mmol/L   CO2 26 20 - 29 mmol/L   Calcium 9.7 8.7 - 10.2 mg/dL  Hepatitis C antibody  Result Value Ref Range   Hep C Virus Ab <0.1 0.0 - 0.9 s/co ratio  Urinalysis, Routine w reflex microscopic  Result Value Ref Range   Specific Gravity, UA 1.015 1.005 - 1.030   pH, UA 6.5 5.0 - 7.5   Color, UA Yellow Yellow   Appearance Ur Cloudy (A) Clear   Leukocytes,UA 3+ (A) Negative   Protein,UA Negative Negative/Trace   Glucose, UA Negative Negative   Ketones, UA Negative Negative   RBC, UA 2+ (A) Negative   Bilirubin, UA Negative Negative   Urobilinogen, Ur 0.2 0.2 - 1.0 mg/dL   Nitrite, UA Negative Negative   Microscopic Examination See below:       Assessment & Plan:   Problem List Items Addressed This Visit   None Visit Diagnoses     Acute non-recurrent maxillary sinusitis    -  Primary   Chest xray ordered. Antibiotic changed to doxycycline. Prednisone x12 days. Advised sugars may increase. Rest and stay hydrated. Albuterol PRN for SOB.   Relevant Medications   doxycycline (VIBRA-TABS) 100 MG tablet   chlorpheniramine-HYDROcodone (TUSSIONEX PENNKINETIC ER) 10-8 MG/5ML SUER   benzonatate (TESSALON) 200 MG capsule   predniSONE (DELTASONE) 10 MG tablet   SOB (shortness of breath)       Relevant Orders   DG Chest 2 View        Follow up plan: No follow-ups on file.

## 2021-05-26 NOTE — Progress Notes (Signed)
Please let patient know that there is not evidence of pneumonia. Continue with plan as discussed at the visit.

## 2021-05-27 LAB — HM PAP SMEAR

## 2021-05-27 LAB — RESULTS CONSOLE HPV: CHL HPV: NEGATIVE

## 2021-07-29 ENCOUNTER — Other Ambulatory Visit: Payer: Self-pay | Admitting: Nurse Practitioner

## 2021-07-29 NOTE — Telephone Encounter (Signed)
Not delegated. °

## 2021-08-29 ENCOUNTER — Other Ambulatory Visit: Payer: Self-pay | Admitting: Nurse Practitioner

## 2021-08-29 NOTE — Telephone Encounter (Signed)
Requested Prescriptions  Pending Prescriptions Disp Refills   ibuprofen (ADVIL) 800 MG tablet [Pharmacy Med Name: IBUPROFEN 800 MG TABLET] 90 tablet 5    Sig: TAKE 1 TABLET BY MOUTH EVERY 8 HOURS AS NEEDED     Analgesics:  NSAIDS Passed - 08/29/2021 10:05 AM      Passed - Cr in normal range and within 360 days    Creatinine, Ser  Date Value Ref Range Status  04/27/2021 0.60 0.57 - 1.00 mg/dL Final         Passed - HGB in normal range and within 360 days    Hemoglobin  Date Value Ref Range Status  01/25/2021 13.8 11.1 - 15.9 g/dL Final         Passed - Patient is not pregnant      Passed - Valid encounter within last 12 months    Recent Outpatient Visits          3 months ago Acute non-recurrent maxillary sinusitis   [place], NP   3 months ago Acute non-recurrent maxillary sinusitis   [facility] Jon Billings, NP   4 months ago Controlled type 2 diabetes with neuropathy ([place])   [place], Barbaraann Faster, NP   7 months ago Colon cancer screening   [place], Lauren A, NP   1 year ago Controlled type 2 diabetes with neuropathy ([place])   [place], Barbaraann Faster, NP      Future Appointments            In 1 month Cannady, Barbaraann Faster, NP [facility]

## 2021-10-22 NOTE — Patient Instructions (Incomplete)

## 2021-10-25 ENCOUNTER — Ambulatory Visit: Payer: BC Managed Care – PPO | Admitting: Nurse Practitioner

## 2021-10-25 DIAGNOSIS — F17210 Nicotine dependence, cigarettes, uncomplicated: Secondary | ICD-10-CM

## 2021-10-25 DIAGNOSIS — Z6827 Body mass index (BMI) 27.0-27.9, adult: Secondary | ICD-10-CM

## 2021-10-25 DIAGNOSIS — E1140 Type 2 diabetes mellitus with diabetic neuropathy, unspecified: Secondary | ICD-10-CM

## 2021-10-25 DIAGNOSIS — E1159 Type 2 diabetes mellitus with other circulatory complications: Secondary | ICD-10-CM

## 2021-10-25 DIAGNOSIS — E1169 Type 2 diabetes mellitus with other specified complication: Secondary | ICD-10-CM

## 2021-10-31 NOTE — Patient Instructions (Signed)

## 2021-11-05 ENCOUNTER — Encounter: Payer: Self-pay | Admitting: Nurse Practitioner

## 2021-11-05 ENCOUNTER — Ambulatory Visit: Payer: BC Managed Care – PPO | Admitting: Nurse Practitioner

## 2021-11-05 VITALS — BP 103/70 | HR 86 | Temp 97.9°F | Ht 67.0 in | Wt 188.6 lb

## 2021-11-05 DIAGNOSIS — I080 Rheumatic disorders of both mitral and aortic valves: Secondary | ICD-10-CM | POA: Diagnosis not present

## 2021-11-05 DIAGNOSIS — F418 Other specified anxiety disorders: Secondary | ICD-10-CM

## 2021-11-05 DIAGNOSIS — I152 Hypertension secondary to endocrine disorders: Secondary | ICD-10-CM

## 2021-11-05 DIAGNOSIS — E1140 Type 2 diabetes mellitus with diabetic neuropathy, unspecified: Secondary | ICD-10-CM

## 2021-11-05 DIAGNOSIS — E1159 Type 2 diabetes mellitus with other circulatory complications: Secondary | ICD-10-CM | POA: Diagnosis not present

## 2021-11-05 DIAGNOSIS — F17210 Nicotine dependence, cigarettes, uncomplicated: Secondary | ICD-10-CM

## 2021-11-05 DIAGNOSIS — H6123 Impacted cerumen, bilateral: Secondary | ICD-10-CM

## 2021-11-05 DIAGNOSIS — E1169 Type 2 diabetes mellitus with other specified complication: Secondary | ICD-10-CM

## 2021-11-05 DIAGNOSIS — E785 Hyperlipidemia, unspecified: Secondary | ICD-10-CM

## 2021-11-05 LAB — MICROALBUMIN, URINE WAIVED
Creatinine, Urine Waived: 50 mg/dL (ref 10–300)
Microalb, Ur Waived: 10 mg/L (ref 0–19)
Microalb/Creat Ratio: <30 mg/g (ref <30)

## 2021-11-05 LAB — BAYER DCA HB A1C WAIVED: HB A1C (BAYER DCA - WAIVED): 5.7 % — ABNORMAL HIGH (ref 4.8–5.6)

## 2021-11-05 MED ORDER — SIMVASTATIN 40 MG PO TABS: 40.0000 mg | ORAL_TABLET | Freq: Every day | ORAL | 4 refills | Status: DC

## 2021-11-05 MED ORDER — CITALOPRAM HYDROBROMIDE 10 MG PO TABS: 10.0000 mg | ORAL_TABLET | Freq: Every day | ORAL | 3 refills | Status: DC

## 2021-11-05 MED ORDER — SAXENDA 18 MG/3ML ~~LOC~~ SOPN: 3.0000 mg | PEN_INJECTOR | Freq: Every day | SUBCUTANEOUS | 6 refills | Status: DC

## 2021-11-05 MED ORDER — LISINOPRIL 10 MG PO TABS: 10.0000 mg | ORAL_TABLET | Freq: Every day | ORAL | 4 refills | Status: DC

## 2021-11-05 MED ORDER — HYDROXYZINE PAMOATE 25 MG PO CAPS: 25.0000 mg | ORAL_CAPSULE | Freq: Three times a day (TID) | ORAL | 0 refills | Status: DC | PRN

## 2021-11-05 NOTE — Assessment & Plan Note (Signed)
Chronic, stable.  Continue current medication regimen and adjust as needed.  Lipid panel today. 

## 2021-11-05 NOTE — Assessment & Plan Note (Signed)
Noted on echo in 2009, no current symptoms, monitor closely. 

## 2021-11-05 NOTE — Assessment & Plan Note (Signed)
Chronic, stable with BP at goal in office today.  Continue Lisinopril, which offers kidney protection.  Urine micro ALB 10 and A:C <04 November 2021.  Recommend she check BP at home at least 3 mornings a week and focus on DASH diet.  Obtain CMP today.  Return in 6 months. ?

## 2021-11-05 NOTE — Assessment & Plan Note (Signed)
Chronic, stable with current A1c 5.7% today, downward trend.  Urine ALB 10 and A:C <30 today.  Continue current medication regimen and adjust as needed.  Recommend she continue to monitor BS at home with goal <130 in morning and <180 two hours after a meal - if elevations she is to notify provider and we may restart Metformin.  At this time she wishes to maintain regimen due to benefit with weight.  Return in 6 months as A1c has been well-controlled for well over a year. ?

## 2021-11-05 NOTE — Progress Notes (Signed)
? ?BP 103/70   Pulse 86   Temp 97.9 ?F (36.6 ?C) (Oral)   Ht 5\' 7"  (1.702 m)   Wt 188 lb 9.6 oz (85.5 kg)   LMP 11/06/2017 (Approximate)   SpO2 99%   BMI 29.54 kg/m?   ? ?Subjective:  ? ? Patient ID: Holly Morales, female    DOB: February 28, 1971, 51 y.o.   MRN: [id number] ? ?HPI: ?Holly Morales is a 51 y.o. female ? ?Chief Complaint  ?Patient presents with  ? Diabetes  ?  Patient declines having a recent Diabetic Eye Exam.   ? Hypertension  ? Hyperlipidemia  ? Ear Problem  ?  Patient states sometimes her left ear feels as if it is stopped up and she is not sure. Patient states she is not sure what is going on because when she pushes it, it just feels she can't explain.   ? Anxiety  ?  Patient states she would like to discuss with provider today about possibly starting something for her Anxiety as she is dealing with some things with her mother.   ? ?History of abnormal pap on 01/07/2019 -- was referred to GYN, went to [facility] in fall 2022 and reports this was normal.  ? ?DIABETES ?Recent A1c 5.9% September. Continues on Saxenda 3 MG daily, not taking Metformin due to good control past visits.   ?Hypoglycemic episodes:no ?Polydipsia/polyuria: no ?Visual disturbance: no ?Chest pain: no ?Paresthesias: no ?Glucose Monitoring: yes ?            Accucheck frequency: every other day ?            Fasting glucose: 112 to 130 range ?            Post prandial: ?            Evening: ?            Before meals: ?Taking Insulin?: no ?            Long acting insulin: ?            Short acting insulin: ?Blood Pressure Monitoring: not checking ?Retinal Examination: Not Up To Date -- [facility] ?Foot Exam: Up to Date ?Pneumovax: Up to Date ?Influenza: Up to Date ?Aspirin: yes ?  ?HYPERTENSION / HYPERLIPIDEMIA ?Continues on Simvastatin for HLD and Lisinopril for HTN.  ? ?Continues to smoke at this time <1 PPD -- started back to smoking 2011, has been smoking for about 30 years.  Was 11 when started smoking.   ?Satisfied with  current treatment? yes ?Duration of hypertension: chronic ?BP monitoring frequency: not checking ?BP range:  ?BP medication side effects: no ?Duration of hyperlipidemia: chronic ?Cholesterol medication side effects: no ?Cholesterol supplements: none ?Medication compliance: good compliance ?Aspirin: no ?Recent stressors: no ?Recurrent headaches: no ?Visual changes: no ?Palpitations: no ?Dyspnea: no ?Chest pain: no ?Lower extremity edema: no ?Dizzy/lightheaded: no ? ?EAR PRESSURE ?Having fullness to left ear. ?Duration: weeks ?Involved ear(s): left ?Fever: no ?Otorrhea: no ?Upper respiratory infection symptoms: no ?Pruritus: no ?Hearing loss: no ?Water immersion no ?Using Q-tips: no ?Recurrent otitis media: no ?Status: stable ?Treatments attempted: none  ? ?ANXIETY/STRESS ?Mother is struggling with dementia, having lots of anxiety with this. 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 ?Duration:uncontrolled ?Anxious mood: yes  ?Excessive worrying: yes ?Irritability: yes  ?Sweating: no ?Nausea: no ?Palpitations:no ?Hyperventilation: no ?Panic attacks: no ?Agoraphobia: no  ?Obscessions/compulsions: no ?Depressed mood: yes ? ?  11/05/2021  ? 11:01 AM 01/25/2021  ?  9:13 AM 07/17/2020  ?  8:32 AM 01/10/2020  ?  8:57 AM 01/07/2019  ?  9:34 AM  ?Depression screen PHQ 2/9  ?Decreased Interest 0 0 0 0 0  ?Down, Depressed, Hopeless 0 0 0 0 0  ?PHQ - 2 Score 0 0 0 0 0  ?Altered sleeping 0   0 0  ?Tired, decreased energy 2   1 0  ?Change in appetite 1   0 0  ?Feeling bad or failure about yourself  0   0 0  ?Trouble concentrating 0   1 0  ?Moving slowly or fidgety/restless 0   0 0  ?Suicidal thoughts 0   0 0  ?PHQ-9 Score 3   2 0  ?Difficult doing work/chores Not difficult at all   Not difficult at all Not difficult at all  ?Anhedonia: no ?Weight changes: no ?Insomnia: yes hard to stay asleep  ?Hypersomnia: no ?Fatigue/loss of energy: no ?Feelings of worthlessness:  no ?Feelings of guilt: no ?Impaired concentration/indecisiveness: no ?Suicidal ideations: no  ?Crying spells: no ?Recent Stressors/Life Changes: yes ?  Relationship problems: no ?  Family stress: yes   ?  Financial stress: no  ?  Job stress: no  ?  Recent death/loss: no  ? ?  11/05/2021  ? 11:02 AM 01/07/2019  ?  9:34 AM 09/11/2018  ?  8:30 AM  ?GAD 7 : Generalized Anxiety Score  ?Nervous, Anxious, on Edge 2 0 1  ?Control/stop worrying 2 0 0  ?Worry too much - different things 3 0 1  ?Trouble relaxing 1 0 0  ?Restless 0 0 0  ?Easily annoyed or irritable 3 0 0  ?Afraid - awful might happen 2 0 0  ?Total GAD 7 Score 13 0 2  ?Anxiety Difficulty Somewhat difficult  Not difficult at all  ? ?Relevant past medical, surgical, family and social history reviewed and updated as indicated. Interim medical history since our last visit reviewed. ?Allergies and medications reviewed and updated. ? ?Review of Systems  ?Constitutional:  Negative for activity change, appetite change, diaphoresis, fatigue and fever.  ?Respiratory:  Negative for cough, chest tightness and shortness of breath.   ?Cardiovascular:  Negative for chest pain, palpitations and leg swelling.  ?Gastrointestinal:  Negative for abdominal distention, abdominal pain, constipation, diarrhea, nausea and vomiting.  ?Endocrine: Negative for cold intolerance, heat intolerance, polydipsia, polyphagia and polyuria.  ?Neurological:  Negative for dizziness, syncope, weakness, light-headedness, numbness and headaches.  ?Psychiatric/Behavioral: Negative.    ? ?Per HPI unless specifically indicated above ? ?   ?Objective:  ?  ?BP 103/70   Pulse 86   Temp 97.9 ?F (36.6 ?C) (Oral)   Ht 5\' 7"  (1.702 m)   Wt 188 lb 9.6 oz (85.5 kg)   LMP 11/06/2017 (Approximate)   SpO2 99%   BMI 29.54 kg/m?   ?Wt Readings from Last 3 Encounters:  ?11/05/21 188 lb 9.6 oz (85.5 kg)  ?05/26/21 180 lb (81.6 kg)  ?05/17/21 180 lb (81.6 kg)  ?  ?Physical Exam ?Vitals and nursing note reviewed.   ?Constitutional:   ?   General: She is awake. She is not in acute distress. ?   Appearance: She is well-developed and overweight. She is not ill-appearing.  ?HENT:  ?   Head: Normocephalic.  ?   Right Ear: Hearing, ear canal and external ear normal. There is impacted cerumen.  ?   Left Ear: Hearing, ear canal and external ear  normal. There is impacted cerumen.  ?   Ears:  ?   Comments: Bilateral ears irrigated by CMA, full clearance after procedure and tolerate well. ?Eyes:  ?   General: Lids are normal.     ?   Right eye: No discharge.     ?   Left eye: No discharge.  ?   Conjunctiva/sclera: Conjunctivae normal.  ?   Pupils: Pupils are equal, round, and reactive to light.  ?Neck:  ?   Thyroid: No thyromegaly.  ?   Vascular: No carotid bruit.  ?Cardiovascular:  ?   Rate and Rhythm: Normal rate and regular rhythm.  ?   Heart sounds: Murmur heard.  ?Systolic murmur is present with a grade of 2/6.  ?  No gallop.  ?Pulmonary:  ?   Effort: Pulmonary effort is normal. No accessory muscle usage or respiratory distress.  ?   Breath sounds: Normal breath sounds.  ?Abdominal:  ?   General: Bowel sounds are normal. There is no distension.  ?   Palpations: Abdomen is soft.  ?   Tenderness: There is no abdominal tenderness. There is no right CVA tenderness or left CVA tenderness.  ?Musculoskeletal:  ?   Cervical back: Normal range of motion and neck supple.  ?   Right lower leg: No edema.  ?   Left lower leg: No edema.  ?Lymphadenopathy:  ?   Cervical: No cervical adenopathy.  ?Skin: ?   General: Skin is warm and dry.  ?Neurological:  ?   Mental Status: She is alert and oriented to person, place, and time.  ?Psychiatric:     ?   Attention and Perception: Attention normal.     ?   Mood and Affect: Mood normal.     ?   Behavior: Behavior normal. Behavior is cooperative.     ?   Thought Content: Thought content normal.     ?   Judgment: Judgment normal.  ? ?Results for orders placed or performed in visit on 11/05/21  ?Bayer DCA Hb  A1c Waived  ?Result Value Ref Range  ? HB A1C (BAYER DCA - WAIVED) 5.7 (H) 4.8 - 5.6 %  ?Microalbumin, Urine Waived  ?Result Value Ref Range  ? Microalb, Ur Waived 10 0 - 19 mg/L  ? Creatinine, Urine Waived 50 10 - 300 mg/dL  ? Mic

## 2021-11-05 NOTE — Assessment & Plan Note (Signed)
I have recommended complete cessation of tobacco use. I have discussed various options available for assistance with tobacco cessation including over the counter methods (Nicotine gum, patch and lozenges). We also discussed prescription options (Chantix, Nicotine Inhaler / Nasal Spray). The patient is not interested in pursuing any prescription tobacco cessation options at this time.  She is interested in lung cancer screening, will place referral today.  

## 2021-11-05 NOTE — Assessment & Plan Note (Signed)
Due to mother with dementia.  Discussed at length treatment options, including no treatment.  At this time will start Celexa 10 MG daily and Vistaril as needed.  Educated her on these and side effects + use.  She is aware not to cold [place] stop Celexa.  Alert provider if any issues.  Denies SI/HI.  Return in 6 weeks. ?

## 2021-11-06 LAB — COMPREHENSIVE METABOLIC PANEL
ALT: 20 IU/L (ref 0–32)
AST: 22 IU/L (ref 0–40)
Albumin/Globulin Ratio: 1.6 (ref 1.2–2.2)
Albumin: 4.5 g/dL (ref 3.8–4.9)
Alkaline Phosphatase: 83 IU/L (ref 44–121)
BUN/Creatinine Ratio: 13 (ref 9–23)
BUN: 8 mg/dL (ref 6–24)
Bilirubin Total: 0.4 mg/dL (ref 0.0–1.2)
CO2: 24 mmol/L (ref 20–29)
Calcium: 9.8 mg/dL (ref 8.7–10.2)
Chloride: 100 mmol/L (ref 96–106)
Creatinine, Ser: 0.62 mg/dL (ref 0.57–1.00)
Globulin, Total: 2.8 g/dL (ref 1.5–4.5)
Glucose: 93 mg/dL (ref 70–99)
Potassium: 4.1 mmol/L (ref 3.5–5.2)
Sodium: 138 mmol/L (ref 134–144)
Total Protein: 7.3 g/dL (ref 6.0–8.5)
eGFR: 107 mL/min/{1.73_m2} (ref >59)

## 2021-11-06 LAB — LIPID PANEL W/O CHOL/HDL RATIO
Cholesterol, Total: 146 mg/dL (ref 100–199)
HDL: 67 mg/dL (ref >39)
LDL Chol Calc (NIH): 69 mg/dL (ref 0–99)
Triglycerides: 46 mg/dL (ref 0–149)
VLDL Cholesterol Cal: 10 mg/dL (ref 5–40)

## 2021-11-06 NOTE — Progress Notes (Signed)
Contacted via [place] ? ? ?Good morning Holly Morales, your labs have returned.  Kidney function, creatinine and eGFR, remains normal, as is liver function, AST and ALT.  Cholesterol labs remain stable.  Great job!!!  Any questions? ?Keep being amazing!!  Thank you for allowing me to participate in your care.  I appreciate you. ?Kindest regards, ?Floreen Teegarden ?

## 2021-11-08 ENCOUNTER — Encounter: Payer: Self-pay | Admitting: Nurse Practitioner

## 2021-11-08 MED ORDER — SIMVASTATIN 20 MG PO TABS: 40.0000 mg | ORAL_TABLET | Freq: Every day | ORAL | 3 refills | Status: DC

## 2021-12-05 ENCOUNTER — Other Ambulatory Visit: Payer: Self-pay | Admitting: Nurse Practitioner

## 2021-12-06 NOTE — Telephone Encounter (Signed)
Requested Prescriptions  ?Pending Prescriptions Disp Refills  ?? hydrOXYzine (VISTARIL) 25 MG capsule [Pharmacy Med Name: HYDROXYZINE PAM 25 MG CAP] 90 capsule 0  ?  Sig: TAKE 1 CAPSULE (25 MG TOTAL) BY MOUTH EVERY 8 (EIGHT) HOURS AS NEEDED.  ?  ? Ear, Nose, and Throat:  Antihistamines 2 Passed - 12/05/2021 12:48 PM  ?  ?  Passed - Cr in normal range and within 360 days  ?  Creatinine, Ser  ?Date Value Ref Range Status  ?11/05/2021 0.62 0.57 - 1.00 mg/dL Final  ?   ?  ?  Passed - Valid encounter within last 12 months  ?  Recent Outpatient Visits   ?      ? 1 month ago Controlled type 2 diabetes with neuropathy (HCC)  ? [facility] [place], [place] T, NP  ? 6 months ago Acute non-recurrent maxillary sinusitis  ? [facility] Larae Grooms, NP  ? 6 months ago Acute non-recurrent maxillary sinusitis  ? [facility] Larae Grooms, NP  ? 7 months ago Controlled type 2 diabetes with neuropathy (HCC)  ? [facility] [place], [place] T, NP  ? 10 months ago Colon cancer screening  ? [facility], Jake Church, NP  ?  ?  ?Future Appointments   ?        ? In 1 week Cannady, Dorie Rank, NP [facility]  ?  ? ?  ?  ?  ? ?

## 2021-12-12 NOTE — Patient Instructions (Signed)

## 2021-12-17 ENCOUNTER — Ambulatory Visit: Payer: BC Managed Care – PPO | Admitting: Nurse Practitioner

## 2021-12-17 ENCOUNTER — Encounter: Payer: Self-pay | Admitting: Nurse Practitioner

## 2021-12-17 DIAGNOSIS — F418 Other specified anxiety disorders: Secondary | ICD-10-CM

## 2021-12-17 MED ORDER — CITALOPRAM HYDROBROMIDE 10 MG PO TABS: 10.0000 mg | ORAL_TABLET | Freq: Every day | ORAL | 4 refills | Status: DC

## 2021-12-17 NOTE — Assessment & Plan Note (Signed)
Due to mother with dementia, symptoms improved at this time with addition of Celexa.  At this time continue Celexa 10 MG daily and Vistaril as needed.  Educated her on these and side effects + use.  She is aware not to cold [place] stop Celexa.  Alert provider if any issues.  Denies SI/HI.  Return in September. ?

## 2021-12-17 NOTE — Progress Notes (Signed)
? ?BP 125/71   Pulse 94   Temp 98.5 ?F (36.9 ?C) (Oral)   Wt 193 lb (87.5 kg)   LMP 11/06/2017 (Approximate)   SpO2 96%   BMI 30.23 kg/m?   ? ?Subjective:  ? ? Patient ID: Holly Morales, female    DOB: 1970-08-25, 51 y.o.   MRN: [id number] ? ?HPI: ?Holly Morales is a 51 y.o. female ? ?Chief Complaint  ?Patient presents with  ? Anxiety  ? ?ANXIETY/STRESS ?She started Celexa 10 MG daily last visit, along with Vistaril due to situational anxiety caring for mother with dementia.   ?Duration:controlled ?Anxious mood:  improved   ?Excessive worrying: no ?Irritability: no  ?Sweating: no ?Nausea: no ?Palpitations:no ?Hyperventilation: no ?Panic attacks: no ?Agoraphobia: no  ?Obscessions/compulsions: no ?Depressed mood: no ? ?  01-06-2022  ?  4:23 PM 11/05/2021  ? 11:01 AM 01/25/2021  ?  9:13 AM 07/17/2020  ?  8:32 AM 01/10/2020  ?  8:57 AM  ?Depression screen PHQ 2/9  ?Decreased Interest 0 0 0 0 0  ?Down, Depressed, Hopeless 0 0 0 0 0  ?PHQ - 2 Score 0 0 0 0 0  ?Altered sleeping 0 0   0  ?Tired, decreased energy _0 ?Change in appetite 0 1   0  ?Feeling bad or failure about yourself  0 0   0  ?Trouble concentrating 0 0   1  ?Moving slowly or fidgety/restless 0 0   0  ?Suicidal thoughts 0 0   0  ?PHQ-9 Score _1 ?Difficult doing work/chores Somewhat difficult Not difficult at all   Not difficult at all  ?Anhedonia: no ?Weight changes: no ?Insomnia: none ?Hypersomnia: no ?Fatigue/loss of energy: no ?Feelings of worthlessness: no ?Feelings of guilt: no ?Impaired concentration/indecisiveness: no ?Suicidal ideations: no  ?Crying spells: no ?Recent Stressors/Life Changes: yes ?  Relationship problems: no ?  Family stress: yes   ?  Financial stress: no  ?  Job stress: no  ?  Recent death/loss: no  ? ?  01/06/22  ?  4:23 PM 11/05/2021  ? 11:02 AM 01/07/2019  ?  9:34 AM 09/11/2018  ?  8:30 AM  ?GAD 7 : Generalized Anxiety Score  ?Nervous, Anxious, on Edge 0 2 0 1  ?Control/stop worrying 0 2 0 0  ?Worry too much - different  things 0 3 0 1  ?Trouble relaxing 0 1 0 0  ?Restless 0 0 0 0  ?Easily annoyed or irritable 0 3 0 0  ?Afraid - awful might happen 0 2 0 0  ?Total GAD 7 Score 0 13 0 2  ?Anxiety Difficulty Not difficult at all Somewhat difficult  Not difficult at all  ? ? ?Relevant past medical, surgical, family and social history reviewed and updated as indicated. Interim medical history since our last visit reviewed. ?Allergies and medications reviewed and updated. ? ?Review of Systems  ?Constitutional:  Negative for activity change, appetite change, diaphoresis, fatigue and fever.  ?Respiratory:  Negative for cough, chest tightness and shortness of breath.   ?Cardiovascular:  Negative for chest pain, palpitations and leg swelling.  ?Gastrointestinal: Negative.   ?Neurological: Negative.   ?Psychiatric/Behavioral: Negative.    ? ?Per HPI unless specifically indicated above ? ?   ?Objective:  ?  ?BP 125/71   Pulse 94   Temp 98.5 ?F (36.9 ?C) (Oral)   Wt 193 lb (87.5 kg)   LMP 11/06/2017 (Approximate)   SpO2  96%   BMI 30.23 kg/m?   ?Wt Readings from Last 3 Encounters:  ?12/17/21 193 lb (87.5 kg)  ?11/05/21 188 lb 9.6 oz (85.5 kg)  ?05/26/21 180 lb (81.6 kg)  ?  ?Physical Exam ?Vitals and nursing note reviewed.  ?Constitutional:   ?   General: She is awake. She is not in acute distress. ?   Appearance: She is well-developed and overweight. She is not ill-appearing.  ?HENT:  ?   Head: Normocephalic.  ?   Right Ear: Hearing normal.  ?   Left Ear: Hearing normal.  ?Eyes:  ?   General: Lids are normal.     ?   Right eye: No discharge.     ?   Left eye: No discharge.  ?   Conjunctiva/sclera: Conjunctivae normal.  ?   Pupils: Pupils are equal, round, and reactive to light.  ?Neck:  ?   Thyroid: No thyromegaly.  ?   Vascular: No carotid bruit.  ?Cardiovascular:  ?   Rate and Rhythm: Normal rate and regular rhythm.  ?   Heart sounds: Normal heart sounds. No murmur heard. ?  No gallop.  ?Pulmonary:  ?   Effort: Pulmonary effort is normal.  No accessory muscle usage or respiratory distress.  ?   Breath sounds: Normal breath sounds.  ?Abdominal:  ?   General: Bowel sounds are normal. There is no distension.  ?   Palpations: Abdomen is soft.  ?   Tenderness: There is no abdominal tenderness.  ?Musculoskeletal:  ?   Cervical back: Normal range of motion and neck supple.  ?   Right lower leg: No edema.  ?   Left lower leg: No edema.  ?Lymphadenopathy:  ?   Cervical: No cervical adenopathy.  ?Skin: ?   General: Skin is warm and dry.  ?Neurological:  ?   Mental Status: She is alert and oriented to person, place, and time.  ?Psychiatric:     ?   Attention and Perception: Attention normal.     ?   Mood and Affect: Mood normal.     ?   Behavior: Behavior normal. Behavior is cooperative.     ?   Thought Content: Thought content normal.     ?   Judgment: Judgment normal.  ? ?Results for orders placed or performed in visit on 11/05/21  ?Bayer DCA Hb A1c Waived  ?Result Value Ref Range  ? HB A1C (BAYER DCA - WAIVED) 5.7 (H) 4.8 - 5.6 %  ?Microalbumin, Urine Waived  ?Result Value Ref Range  ? Microalb, Ur Waived 10 0 - 19 mg/L  ? Creatinine, Urine Waived 50 10 - 300 mg/dL  ? Microalb/Creat Ratio <30 <30 mg/g  ?Comprehensive metabolic panel  ?Result Value Ref Range  ? Glucose 93 70 - 99 mg/dL  ? BUN 8 6 - 24 mg/dL  ? Creatinine, Ser 0.62 0.57 - 1.00 mg/dL  ? eGFR 107 >59 mL/min/1.73  ? BUN/Creatinine Ratio 13 9 - 23  ? Sodium 138 134 - 144 mmol/L  ? Potassium 4.1 3.5 - 5.2 mmol/L  ? Chloride 100 96 - 106 mmol/L  ? CO2 24 20 - 29 mmol/L  ? Calcium 9.8 8.7 - 10.2 mg/dL  ? Total Protein 7.3 6.0 - 8.5 g/dL  ? Albumin 4.5 3.8 - 4.9 g/dL  ? Globulin, Total 2.8 1.5 - 4.5 g/dL  ? Albumin/Globulin Ratio 1.6 1.2 - 2.2  ? Bilirubin Total 0.4 0.0 - 1.2 mg/dL  ? Alkaline Phosphatase 83  44 - 121 IU/L  ? AST 22 0 - 40 IU/L  ? ALT 20 0 - 32 IU/L  ?Lipid Panel w/o Chol/HDL Ratio  ?Result Value Ref Range  ? Cholesterol, Total 146 100 - 199 mg/dL  ? Triglycerides 46 0 - 149 mg/dL  ?  HDL 67 >39 mg/dL  ? VLDL Cholesterol Cal 10 5 - 40 mg/dL  ? LDL Chol Calc (NIH) 69 0 - 99 mg/dL  ? ?   ?Assessment & Plan:  ? ?Problem List Items Addressed This Visit   ? ?  ? Other  ? Situational anxiety  ?  Due to mother with dementia, symptoms improved at this time with addition of Celexa.  At this time continue Celexa 10 MG daily and Vistaril as needed.  Educated her on these and side effects + use.  She is aware not to cold [place] stop Celexa.  Alert provider if any issues.  Denies SI/HI.  Return in September. ? ?  ?  ? Relevant Medications  ? citalopram (CELEXA) 10 MG tablet  ?  ? ?Follow up plan: ?Return in about 5 months (around 05/09/2022) for Annual physical with diabetes check. ? ? ? ? ? ? ? ?

## 2021-12-21 ENCOUNTER — Other Ambulatory Visit: Payer: Self-pay

## 2021-12-21 DIAGNOSIS — Z122 Encounter for screening for malignant neoplasm of respiratory organs: Secondary | ICD-10-CM

## 2021-12-21 DIAGNOSIS — F17210 Nicotine dependence, cigarettes, uncomplicated: Secondary | ICD-10-CM

## 2021-12-21 DIAGNOSIS — Z87891 Personal history of nicotine dependence: Secondary | ICD-10-CM

## 2021-12-30 ENCOUNTER — Other Ambulatory Visit: Payer: Self-pay | Admitting: Nurse Practitioner

## 2022-01-03 NOTE — Telephone Encounter (Signed)
Requested Prescriptions  Pending Prescriptions Disp Refills  . hydrOXYzine (VISTARIL) 25 MG capsule [Pharmacy Med Name: HYDROXYZINE PAM 25 MG CAP] 270 capsule 1    Sig: TAKE 1 CAPSULE (25 MG TOTAL) BY MOUTH EVERY 8 (EIGHT) HOURS AS NEEDED.     Ear, Nose, and Throat:  Antihistamines 2 Passed - 12/30/2021  2:33 PM      Passed - Cr in normal range and within 360 days    Creatinine, Ser  Date Value Ref Range Status  11/05/2021 0.62 0.57 - 1.00 mg/dL Final         Passed - Valid encounter within last 12 months    Recent Outpatient Visits          2 weeks ago Situational anxiety   [place], [place] T, NP   1 month ago Controlled type 2 diabetes with neuropathy ([place])   [place], [place] T, NP   7 months ago Acute non-recurrent maxillary sinusitis   [place], NP   7 months ago Acute non-recurrent maxillary sinusitis   [place], NP   8 months ago Controlled type 2 diabetes with neuropathy [facility])   [place], Barbaraann Faster, NP      Future Appointments            In 4 months Cannady, Barbaraann Faster, NP [facility]

## 2022-01-11 ENCOUNTER — Ambulatory Visit (INDEPENDENT_AMBULATORY_CARE_PROVIDER_SITE_OTHER): Payer: BC Managed Care – PPO | Admitting: Acute Care

## 2022-01-11 ENCOUNTER — Encounter: Payer: Self-pay | Admitting: Acute Care

## 2022-01-11 DIAGNOSIS — R911 Solitary pulmonary nodule: Secondary | ICD-10-CM | POA: Diagnosis not present

## 2022-01-11 DIAGNOSIS — F17210 Nicotine dependence, cigarettes, uncomplicated: Secondary | ICD-10-CM

## 2022-01-11 NOTE — Progress Notes (Signed)
Virtual Visit via Telephone Note  I connected with Holly Morales on 01/11/22 at 12:00 PM EDT by telephone and verified that I am speaking with the correct person using two identifiers.  Location: Patient:  At Home Provider: [address], [place], [place], [address]    I discussed the limitations, risks, security and privacy concerns of performing an evaluation and management service by telephone and the availability of in person appointments. I also discussed with the patient that there may be a patient responsible charge related to this service. The patient expressed understanding and agreed to proceed.   Shared Decision Making Visit Lung Cancer Screening Program [phone])   Eligibility: Age 1 y.o. Pack Years Smoking History Calculation 23 pack year smoking history (# packs/per year x # years smoked) Recent History of coughing up blood  no Unexplained weight loss? no ( >Than 15 pounds within the last 6 months ) Prior History Lung / other cancer no (Diagnosis within the last 5 years already requiring surveillance chest CT Scans). Smoking Status Current Smoker Former Smokers: Years since quit: .NA  Quit Date: NA  Visit Components: Discussion included one or more decision making aids. yes Discussion included risk/benefits of screening. yes Discussion included potential follow up diagnostic testing for abnormal scans. yes Discussion included meaning and risk of over diagnosis. yes Discussion included meaning and risk of False Positives. yes Discussion included meaning of total radiation exposure. yes  Counseling Included: Importance of adherence to annual lung cancer LDCT screening. yes Impact of comorbidities on ability to participate in the program. yes Ability and willingness to under diagnostic treatment. yes  Smoking Cessation Counseling: Current Smokers:  Discussed importance of smoking cessation. yes Information about tobacco cessation classes and interventions  provided to patient. yes Patient provided with "ticket" for LDCT Scan. yes Symptomatic Patient. no  Counseling NA Diagnosis Code: Tobacco Use Z72.0 Asymptomatic Patient yes  Counseling (Intermediate counseling: > three minutes counseling) M0947 Former Smokers:  Discussed the importance of maintaining cigarette abstinence. yes Diagnosis Code: Personal History of Nicotine Dependence. S96.283 Information about tobacco cessation classes and interventions provided to patient. Yes Patient provided with "ticket" for LDCT Scan. yes Written Order for Lung Cancer Screening with LDCT placed in Epic. Yes (CT Chest Lung Cancer Screening Low Dose W/O CM) [id number] Z12.2-Screening of respiratory organs Z87.891-Personal history of nicotine dependence  I have spent 25 minutes of face to face/ virtual visit   time with  Ms. Wolman discussing the risks and benefits of lung cancer screening. We viewed / discussed a power point together that explained in detail the above noted topics. We paused at intervals to allow for questions to be asked and answered to ensure understanding.We discussed that the single most powerful action that she can take to decrease her risk of developing lung cancer is to quit smoking. We discussed whether or not she is ready to commit to setting a quit date. We discussed options for tools to aid in quitting smoking including nicotine replacement therapy, non-nicotine medications, support groups, Quit Smart classes, and behavior modification. We discussed that often times setting smaller, more achievable goals, such as eliminating 1 cigarette a day for a week and then 2 cigarettes a day for a week can be helpful in slowly decreasing the number of cigarettes smoked. This allows for a sense of accomplishment as well as providing a clinical benefit. I provided  her  with smoking cessation  information  with contact information for community resources, classes, free nicotine replacement  therapy, and  access to mobile apps, text messaging, and on-line smoking cessation help. I have also provided  her With   the office contact information in the event her needs to contact me, or the screening staff. We discussed the time and location of the scan, and that either Abigail Miyamoto RN, Karlton Lemon, RN  or I will call / send a letter with the results within 24-72 hours of receiving them. The patient verbalized understanding of all of  the above and had no further questions upon leaving the office. They have my contact information in the event they have any further questions.  I spent 3 minutes counseling on smoking cessation and the health risks of continued tobacco abuse.  I explained to the patient that there has been a high incidence of coronary artery disease noted on these exams. I explained that this is a non-gated exam therefore degree or severity cannot be determined. This patient is on statin therapy. I have asked the patient to follow-up with their PCP regarding any incidental finding of coronary artery disease and management with diet or medication as their PCP  feels is clinically indicated. The patient verbalized understanding of the above and had no further questions upon completion of the visit.      Bevelyn Ngo, NP 01/11/2022

## 2022-01-11 NOTE — Patient Instructions (Signed)
Thank you for participating in the [facility]. It was our pleasure to meet you today. We will call you with the results of your scan within the next few days. Your scan will be assigned a Lung RADS category score by the physicians reading the scans.  This Lung RADS score determines follow up scanning.  See below for description of categories, and follow up screening recommendations. We will be in touch to schedule your follow up screening annually or based on recommendations of our providers. We will fax a copy of your scan results to your Primary Care Physician, or the physician who referred you to the program, to ensure they have the results. Please call the office if you have any questions or concerns regarding your scanning experience or results.  Our office number is [phone]. Please speak with Denise Phelps, RN. , or  Denise Buckner RN, They are  our Lung Cancer Screening RN.'s If They are unavailable when you call, Please leave a message on the voice mail. We will return your call at our earliest convenience.This voice mail is monitored several times a day.  Remember, if your scan is normal, we will scan you annually as long as you continue to meet the criteria for the program. (Age 55-77, Current smoker or smoker who has quit within the last 15 years). If you are a smoker, remember, quitting is the single most powerful action that you can take to decrease your risk of lung cancer and other pulmonary, breathing related problems. We know quitting is hard, and we are here to help.  Please let us know if there is anything we can do to help you meet your goal of quitting. If you are a former smoker, congratulations. We are proud of you! Remain smoke free! Remember you can refer friends or family members through the number above.  We will screen them to make sure they meet criteria for the program. Thank you for helping us take better care of you by  participating in Lung Screening.  You can receive free nicotine replacement therapy ( patches, gum or mints) by calling 1-800-QUIT NOW. Please call so we can get you on the path to becoming  a non-smoker. I know it is hard, but you can do this!  Lung RADS Categories:  Lung RADS 1: no nodules or definitely non-concerning nodules.  Recommendation is for a repeat annual scan in 12 months.  Lung RADS 2:  nodules that are non-concerning in appearance and behavior with a very low likelihood of becoming an active cancer. Recommendation is for a repeat annual scan in 12 months.  Lung RADS 3: nodules that are probably non-concerning , includes nodules with a low likelihood of becoming an active cancer.  Recommendation is for a 6-month repeat screening scan. Often noted after an upper respiratory illness. We will be in touch to make sure you have no questions, and to schedule your 6-month scan.  Lung RADS 4 A: nodules with concerning findings, recommendation is most often for a follow up scan in 3 months or additional testing based on our provider's assessment of the scan. We will be in touch to make sure you have no questions and to schedule the recommended 3 month follow up scan.  Lung RADS 4 B:  indicates findings that are concerning. We will be in touch with you to schedule additional diagnostic testing based on our provider's  assessment of the scan.  Other options for assistance in smoking cessation (   As covered by your insurance benefits)  Hypnosis for smoking cessation  [employer] [phone]  Acupuncture for smoking cessation  [facility] [phone]   

## 2022-01-12 ENCOUNTER — Ambulatory Visit
Admission: RE | Admit: 2022-01-12 | Discharge: 2022-01-12 | Disposition: A | Discharge status: Home / Self Care | Payer: BC Managed Care – PPO | Source: Ambulatory Visit

## 2022-01-12 DIAGNOSIS — F17210 Nicotine dependence, cigarettes, uncomplicated: Secondary | ICD-10-CM

## 2022-01-12 DIAGNOSIS — Z87891 Personal history of nicotine dependence: Secondary | ICD-10-CM

## 2022-01-12 DIAGNOSIS — Z122 Encounter for screening for malignant neoplasm of respiratory organs: Secondary | ICD-10-CM

## 2022-01-14 ENCOUNTER — Other Ambulatory Visit: Payer: Self-pay | Admitting: Acute Care

## 2022-01-14 DIAGNOSIS — Z122 Encounter for screening for malignant neoplasm of respiratory organs: Secondary | ICD-10-CM

## 2022-01-14 DIAGNOSIS — F17210 Nicotine dependence, cigarettes, uncomplicated: Secondary | ICD-10-CM

## 2022-01-18 ENCOUNTER — Encounter: Payer: Self-pay | Admitting: Nurse Practitioner

## 2022-01-18 ENCOUNTER — Telehealth: Payer: Self-pay

## 2022-01-18 NOTE — Telephone Encounter (Signed)
The denial letter does not have any alternatives listed, but they do not have the appeal information if you want to send one of the appeal letters for the patient and we can fax it over to them. Please advise?

## 2022-01-18 NOTE — Telephone Encounter (Signed)
Prior authorization was initiated via CoverMyMeds for prescription Saxenda. Awaiting determination.  KEY: [id number]

## 2022-01-18 NOTE — Telephone Encounter (Signed)
Prior authorization was denied for prescription Saxenda via CoverMyMeds.

## 2022-01-19 NOTE — Telephone Encounter (Signed)
Appeal letter was faxed back to [facility] along with denial letter.

## 2022-02-28 ENCOUNTER — Other Ambulatory Visit: Payer: Self-pay | Admitting: Nurse Practitioner

## 2022-03-01 NOTE — Telephone Encounter (Signed)
Requested medication (s) are due for refill today: yes  Requested medication (s) are on the active medication list: yes  Last refill:  08/29/21 #90/5  Future visit scheduled: yes  Notes to clinic:  Unable to refill per protocol due to failed labs, no updated results.     Requested Prescriptions  Pending Prescriptions Disp Refills   ibuprofen (ADVIL) 800 MG tablet [Pharmacy Med Name: IBUPROFEN 800 MG TABLET] 90 tablet 5    Sig: TAKE 1 TABLET BY MOUTH EVERY 8 HOURS AS NEEDED     Analgesics:  NSAIDS Failed - 02/28/2022  2:14 AM      Failed - Manual Review: Labs are only required if the patient has taken medication for more than 8 weeks.      Failed - HGB in normal range and within 360 days    Hemoglobin  Date Value Ref Range Status  01/25/2021 13.8 11.1 - 15.9 g/dL Final         Failed - PLT in normal range and within 360 days    Platelets  Date Value Ref Range Status  01/25/2021 264 150 - 450 x10E3/uL Final         Failed - HCT in normal range and within 360 days    Hematocrit  Date Value Ref Range Status  01/25/2021 41.4 34.0 - 46.6 % Final         Passed - Cr in normal range and within 360 days    Creatinine, Ser  Date Value Ref Range Status  11/05/2021 0.62 0.57 - 1.00 mg/dL Final         Passed - eGFR is 30 or above and within 360 days    GFR calc Af Amer  Date Value Ref Range Status  07/17/2020 124 >59 mL/min/1.73 Final    Comment:    **In accordance with recommendations from the NKF-ASN Task force,**   [facility] is in the process of updating its eGFR calculation to the   2021 CKD-EPI creatinine equation that estimates kidney function   without a race variable.    GFR calc non Af Amer  Date Value Ref Range Status  07/17/2020 108 >59 mL/min/1.73 Final   eGFR  Date Value Ref Range Status  11/05/2021 107 >59 mL/min/1.73 Final         Passed - Patient is not pregnant      Passed - Valid encounter within last 12 months    Recent Outpatient Visits            2 months ago Situational anxiety   [place], [place] T, NP   3 months ago Controlled type 2 diabetes with neuropathy ([address])   [place], Jolene T, NP   9 months ago Acute non-recurrent maxillary sinusitis   [place], NP   9 months ago Acute non-recurrent maxillary sinusitis   [place], NP   10 months ago Controlled type 2 diabetes with neuropathy [facility])   [place], Barbaraann Faster, NP       Future Appointments             In 2 months Cannady, Barbaraann Faster, NP [facility], [facility]

## 2022-03-03 ENCOUNTER — Other Ambulatory Visit: Payer: Self-pay | Admitting: Nurse Practitioner

## 2022-03-03 DIAGNOSIS — Z1231 Encounter for screening mammogram for malignant neoplasm of breast: Secondary | ICD-10-CM

## 2022-04-01 ENCOUNTER — Other Ambulatory Visit: Payer: Self-pay | Admitting: Nurse Practitioner

## 2022-04-01 NOTE — Telephone Encounter (Signed)
Requested Prescriptions  Pending Prescriptions Disp Refills  . hydrOXYzine (VISTARIL) 25 MG capsule [Pharmacy Med Name: HYDROXYZINE PAM 25 MG CAP] 270 capsule 0    Sig: TAKE 1 CAPSULE (25 MG TOTAL) BY MOUTH EVERY 8 (EIGHT) HOURS AS NEEDED.     Ear, Nose, and Throat:  Antihistamines 2 Passed - 04/01/2022  2:17 AM      Passed - Cr in normal range and within 360 days    Creatinine, Ser  Date Value Ref Range Status  11/05/2021 0.62 0.57 - 1.00 mg/dL Final         Passed - Valid encounter within last 12 months    Recent Outpatient Visits          3 months ago Situational anxiety   [facility] [place], Holly T, NP   4 months ago Controlled type 2 diabetes with neuropathy (HCC)   [facility] [place], Holly T, NP   10 months ago Acute non-recurrent maxillary sinusitis   [facility] Holly Grooms, NP   10 months ago Acute non-recurrent maxillary sinusitis   [facility] Holly Grooms, NP   11 months ago Controlled type 2 diabetes with neuropathy [facility])   [facility] Cannady, Holly Rank, NP      Future Appointments            In 1 month Cannady, Holly Rank, NP [facility], [facility]

## 2022-04-18 ENCOUNTER — Ambulatory Visit
Admission: RE | Admit: 2022-04-18 | Discharge: 2022-04-18 | Disposition: A | Discharge status: Home / Self Care | Payer: BC Managed Care – PPO | Source: Ambulatory Visit | Attending: Nurse Practitioner | Admitting: Nurse Practitioner

## 2022-04-18 DIAGNOSIS — Z1231 Encounter for screening mammogram for malignant neoplasm of breast: Secondary | ICD-10-CM | POA: Diagnosis present

## 2022-04-20 NOTE — Progress Notes (Signed)
Contacted via MyChart   Normal mammogram, may repeat in one year:)

## 2022-05-13 NOTE — Patient Instructions (Signed)

## 2022-05-16 ENCOUNTER — Ambulatory Visit (INDEPENDENT_AMBULATORY_CARE_PROVIDER_SITE_OTHER): Payer: BC Managed Care – PPO | Admitting: Nurse Practitioner

## 2022-05-16 ENCOUNTER — Encounter: Payer: Self-pay | Admitting: Nurse Practitioner

## 2022-05-16 VITALS — BP 135/83 | HR 82 | Temp 98.8°F | Ht 66.5 in | Wt 206.9 lb

## 2022-05-16 DIAGNOSIS — E1159 Type 2 diabetes mellitus with other circulatory complications: Secondary | ICD-10-CM

## 2022-05-16 DIAGNOSIS — Z0000 Encounter for general adult medical examination without abnormal findings: Secondary | ICD-10-CM

## 2022-05-16 DIAGNOSIS — E6609 Other obesity due to excess calories: Secondary | ICD-10-CM

## 2022-05-16 DIAGNOSIS — I080 Rheumatic disorders of both mitral and aortic valves: Secondary | ICD-10-CM

## 2022-05-16 DIAGNOSIS — E785 Hyperlipidemia, unspecified: Secondary | ICD-10-CM

## 2022-05-16 DIAGNOSIS — E1140 Type 2 diabetes mellitus with diabetic neuropathy, unspecified: Secondary | ICD-10-CM | POA: Diagnosis not present

## 2022-05-16 DIAGNOSIS — F17210 Nicotine dependence, cigarettes, uncomplicated: Secondary | ICD-10-CM

## 2022-05-16 DIAGNOSIS — E559 Vitamin D deficiency, unspecified: Secondary | ICD-10-CM

## 2022-05-16 DIAGNOSIS — Z23 Encounter for immunization: Secondary | ICD-10-CM

## 2022-05-16 DIAGNOSIS — I152 Hypertension secondary to endocrine disorders: Secondary | ICD-10-CM

## 2022-05-16 DIAGNOSIS — Z6827 Body mass index (BMI) 27.0-27.9, adult: Secondary | ICD-10-CM

## 2022-05-16 DIAGNOSIS — F418 Other specified anxiety disorders: Secondary | ICD-10-CM

## 2022-05-16 DIAGNOSIS — E1169 Type 2 diabetes mellitus with other specified complication: Secondary | ICD-10-CM | POA: Diagnosis not present

## 2022-05-16 DIAGNOSIS — Z6832 Body mass index (BMI) 32.0-32.9, adult: Secondary | ICD-10-CM

## 2022-05-16 LAB — BAYER DCA HB A1C WAIVED: HB A1C (BAYER DCA - WAIVED): 7.1 % — ABNORMAL HIGH (ref 4.8–5.6)

## 2022-05-16 MED ORDER — OZEMPIC (0.25 OR 0.5 MG/DOSE) 2 MG/1.5ML ~~LOC~~ SOPN: PEN_INJECTOR | SUBCUTANEOUS | 4 refills | Status: DC

## 2022-05-16 MED ORDER — HYDROXYZINE PAMOATE 25 MG PO CAPS: 25.0000 mg | ORAL_CAPSULE | Freq: Three times a day (TID) | ORAL | 4 refills | Status: DC | PRN

## 2022-05-16 MED ORDER — NICOTINE 21 MG/24HR TD PT24: 21.0000 mg | MEDICATED_PATCH | Freq: Every day | TRANSDERMAL | 0 refills | Status: DC

## 2022-05-16 MED ORDER — SIMVASTATIN 40 MG PO TABS: 40.0000 mg | ORAL_TABLET | Freq: Every day | ORAL | 4 refills | Status: DC

## 2022-05-16 MED ORDER — NICOTINE 14 MG/24HR TD PT24: 14.0000 mg | MEDICATED_PATCH | Freq: Every day | TRANSDERMAL | 0 refills | Status: AC

## 2022-05-16 MED ORDER — CYCLOBENZAPRINE HCL 5 MG PO TABS: ORAL_TABLET | ORAL | 2 refills | Status: DC

## 2022-05-16 MED ORDER — NICOTINE 7 MG/24HR TD PT24: 7.0000 mg | MEDICATED_PATCH | Freq: Every day | TRANSDERMAL | 4 refills | Status: AC

## 2022-05-16 MED ORDER — LISINOPRIL 10 MG PO TABS: 10.0000 mg | ORAL_TABLET | Freq: Every day | ORAL | 4 refills | Status: DC

## 2022-05-16 NOTE — Progress Notes (Signed)
BP 135/83   Pulse 82   Temp 98.8 F (37.1 C) (Oral)   Ht 5' 6.5" (1.689 m)   Wt 206 lb 14.4 oz (93.8 kg)   LMP 11/06/2017 (Approximate)   SpO2 96%   BMI 32.89 kg/m    Subjective:    Patient ID: Holly Morales, female    DOB: 12/04/1970, 51 y.o.   MRN: [id number]  HPI: Holly Morales is a 51 y.o. female presenting on 05/16/2022 for comprehensive medical examination. Current medical complaints include:none  She currently lives with: significant other Menopausal Symptoms: no  History of abnormal pap on 01/07/2019 -- was referred to GYN, went to [facility] in Fall 2022 and reports this was normal and overall exam was good -- no records in chart.   DIABETES A1c last visit was 5.7%. Continues on Saxenda 3 MG daily, ran out of this and insurance would not cover -- when went to restart it made her very nauseous and struggled with this.  Has gained some weight. Hypoglycemic episodes:no Polydipsia/polyuria: no Visual disturbance: no Chest pain: no Paresthesias: no Glucose Monitoring: yes             Accucheck frequency: every other day             Fasting glucose: 120 range             Post prandial:             Evening:             Before meals: Taking Insulin?: no             Long acting insulin:             Short acting insulin: Blood Pressure Monitoring: not checking Retinal Examination: Not Up To Date -- [facility] Foot Exam: Up to Date Pneumovax: Up to Date Influenza: Up to Date Aspirin: yes   HYPERTENSION / HYPERLIPIDEMIA Continues on Simvastatin for HLD and Lisinopril for HTN.    Continues to smoke at this time 1/2 PPD -- started back to smoking 2011, has been smoking for about 30 years.  Was 29 when started smoking.  Had initial lung CA cancer screening on 01/12/22 - mild centrilobular emphysema and aortic atherosclerosis. Satisfied with current treatment? yes Duration of hypertension: chronic BP monitoring frequency: not checking BP range:  BP medication side  effects: no Duration of hyperlipidemia: chronic Cholesterol medication side effects: no Cholesterol supplements: none Medication compliance: good compliance Aspirin: no Recent stressors: no Recurrent headaches: no Visual changes: no Palpitations: no Dyspnea: no Chest pain: no Lower extremity edema: no Dizzy/lightheaded: no  ANXIETY/STRESS Her mother has dementia and is caregiver to her, overall is doing okay at this time.  Taking Vistaril 25 MG BID PRN, not taking Celexa -- decided not to take it due to concerns for side effects. Duration: stable Anxious mood: no Excessive worrying: no Irritability: yes, occasional Sweating: no Nausea: no Palpitations:no Hyperventilation: no Panic attacks: no Agoraphobia: no  Obscessions/compulsions: no Depressed mood: no    05/16/2022    8:19 AM 12/17/2021    4:23 PM 11/05/2021   11:01 AM 01/25/2021    9:13 AM 07/17/2020    8:32 AM  Depression screen PHQ 2/9  Decreased Interest 1 0 0 0 0  Down, Depressed, Hopeless 0 0 0 0 0  PHQ - 2 Score 1 0 0 0 0  Altered sleeping 2 0 0    Tired, decreased energy 2 1 2  Change in appetite 0 0 1    Feeling bad or failure about yourself  0 0 0    Trouble concentrating 0 0 0    Moving slowly or fidgety/restless 0 0 0    Suicidal thoughts 0 0 0    PHQ-9 Score _0 Difficult doing work/chores Not difficult at all Somewhat difficult Not difficult at all         05/16/2022    8:19 AM 12/17/2021    4:23 PM 11/05/2021   11:02 AM 01/07/2019    9:34 AM  GAD 7 : Generalized Anxiety Score  Nervous, Anxious, on Edge 1 0 2 0  Control/stop worrying 0 0 2 0  Worry too much - different things 0 0 3 0  Trouble relaxing 0 0 1 0  Restless 0 0 0 0  Easily annoyed or irritable 0 0 3 0  Afraid - awful might happen 2 0 2 0  Total GAD 7 Score 3 0 13 0  Anxiety Difficulty Not difficult at all Not difficult at all Somewhat difficult    Functional Status Survey: Is the patient deaf or have difficulty hearing?:  No Does the patient have difficulty seeing, even when wearing glasses/contacts?: No Does the patient have difficulty concentrating, remembering, or making decisions?: No Does the patient have difficulty walking or climbing stairs?: No Does the patient have difficulty dressing or bathing?: No Does the patient have difficulty doing errands alone such as visiting a doctor's office or shopping?: No      07/17/2020    8:32 AM 01/25/2021    9:13 AM 04/15/2021    7:07 AM 12/17/2021    4:23 PM 05/16/2022    8:20 AM  Fall Risk  Falls in the past year? 0 1  0 0  Was there an injury with Fall? 0 0  0 0  Fall Risk Category Calculator 0 1  0 0  Fall Risk Category Low Low  Low Low  Patient Fall Risk Level  Low fall risk Low fall risk Low fall risk Low fall risk  Patient at Risk for Falls Due to  No Fall Risks  No Fall Risks No Fall Risks  Fall risk Follow up Falls evaluation completed Falls evaluation completed  Falls evaluation completed Falls prevention discussed    Past Medical History:  Past Medical History:  Diagnosis Date   Depression    06/25/18 no longer an issue per pt    Diabetes mellitus without complication (HCC)    High cholesterol    Obesity (BMI 30-39.9)     Surgical History:  Past Surgical History:  Procedure Laterality Date   CARPAL TUNNEL RELEASE Right 07/07/2020   COLONOSCOPY WITH PROPOFOL N/A 04/15/2021   Procedure: COLONOSCOPY WITH PROPOFOL;  Surgeon: Jonathon Bellows, MD;  Location: [facility] ENDOSCOPY;  Service: Gastroenterology;  Laterality: N/A;   LEEP  2009   NO PAST SURGERIES      Medications:  Current Outpatient Medications on File Prior to Visit  Medication Sig   aspirin EC 81 MG tablet Take 81 mg by mouth daily. Swallow whole.   B-D ULTRAFINE III SHORT PEN 31G X 8 MM MISC USE ONE NEEDLE DAILY TO INJECT SAXENDA   glucose blood (ONE TOUCH ULTRA TEST) test strip USE TO TEST BLOOD SUGAR DAILY   ibuprofen (ADVIL) 800 MG tablet TAKE 1 TABLET BY MOUTH EVERY 8 HOURS AS NEEDED    ONETOUCH DELICA LANCETS 22G MISC USE TO TEST BLOOD SUGAR DAILY  No current facility-administered medications on file prior to visit.    Allergies:  Allergies  Allergen Reactions   Jardiance [Empagliflozin]     Candidiasis    Social History:  Social History   Socioeconomic History   Marital status: Soil scientist    Spouse name: Not on file   Number of children: 0   Years of education: Not on file   Highest education level: High school graduate  Occupational History   Not on file  Tobacco Use   Smoking status: Every Day    Packs/day: 0.75    Years: 30.00    Total pack years: 22.50    Types: Cigarettes    Last attempt to quit: 07/06/2019    Years since quitting: 2.8   Smokeless tobacco: Never  Vaping Use   Vaping Use: Never used  Substance and Sexual Activity   Alcohol use: Yes    Alcohol/week: 1.0 standard drink of alcohol    Types: 1 Standard drinks or equivalent per week    Comment: on occasion   Drug use: No    Comment: 06/25/18--30 yrs ago smoked marijuana   Sexual activity: Yes  Other Topics Concern   Not on file  Social History Narrative   Lives at home alone   Right handed   Caffeine: 1 pot of coffee every morning, 1 cup of unsweet tea daily, lots of water, very seldom soda.   Social Determinants of Health   Financial Resource Strain: Not on file  Food Insecurity: Not on file  Transportation Needs: Not on file  Physical Activity: Not on file  Stress: Not on file  Social Connections: Not on file  Intimate Partner Violence: Not on file   Social History   Tobacco Use  Smoking Status Every Day   Packs/day: 0.75   Years: 30.00   Total pack years: 22.50   Types: Cigarettes   Last attempt to quit: 07/06/2019   Years since quitting: 2.8  Smokeless Tobacco Never   Social History   Substance and Sexual Activity  Alcohol Use Yes   Alcohol/week: 1.0 standard drink of alcohol   Types: 1 Standard drinks or equivalent per week   Comment: on  occasion    Family History:  Family History  Problem Relation Age of Onset   Dementia Mother    Diabetes Father    Cancer Father        Prostate   Irregular heart beat Father    Breast cancer Neg Hx     Past medical history, surgical history, medications, allergies, family history and social history reviewed with patient today and changes made to appropriate areas of the chart.   ROS All other ROS negative except what is listed above and in the HPI.      Objective:    BP 135/83   Pulse 82   Temp 98.8 F (37.1 C) (Oral)   Ht 5' 6.5" (1.689 m)   Wt 206 lb 14.4 oz (93.8 kg)   LMP 11/06/2017 (Approximate)   SpO2 96%   BMI 32.89 kg/m   Wt Readings from Last 3 Encounters:  05/16/22 206 lb 14.4 oz (93.8 kg)  12/17/21 193 lb (87.5 kg)  11/05/21 188 lb 9.6 oz (85.5 kg)    Physical Exam Vitals and nursing note reviewed.  Constitutional:      General: She is awake. She is not in acute distress.    Appearance: She is well-developed and well-groomed. She is obese. She is not ill-appearing or toxic-appearing.  HENT:     Head: Normocephalic and atraumatic.     Right Ear: Hearing, tympanic membrane, ear canal and external ear normal. No drainage.     Left Ear: Hearing, tympanic membrane, ear canal and external ear normal. No drainage.     Nose: Nose normal.     Right Sinus: No maxillary sinus tenderness or frontal sinus tenderness.     Left Sinus: No maxillary sinus tenderness or frontal sinus tenderness.     Mouth/Throat:     Mouth: Mucous membranes are moist.     Pharynx: Oropharynx is clear. Uvula midline. No pharyngeal swelling, oropharyngeal exudate or posterior oropharyngeal erythema.  Eyes:     General: Lids are normal.        Right eye: No discharge.        Left eye: No discharge.     Extraocular Movements: Extraocular movements intact.     Conjunctiva/sclera: Conjunctivae normal.     Pupils: Pupils are equal, round, and reactive to light.     Visual Fields: Right  eye visual fields normal and left eye visual fields normal.  Neck:     Thyroid: No thyromegaly.     Vascular: No carotid bruit.     Trachea: Trachea normal.  Cardiovascular:     Rate and Rhythm: Normal rate and regular rhythm.     Heart sounds: Normal heart sounds. No murmur heard.    No gallop.  Pulmonary:     Effort: Pulmonary effort is normal. No accessory muscle usage or respiratory distress.     Breath sounds: Normal breath sounds.  Abdominal:     General: Bowel sounds are normal.     Palpations: Abdomen is soft. There is no hepatomegaly or splenomegaly.     Tenderness: There is no abdominal tenderness.  Musculoskeletal:        General: Normal range of motion.     Cervical back: Normal range of motion and neck supple.     Right lower leg: No edema.     Left lower leg: No edema.  Lymphadenopathy:     Head:     Right side of head: No submental, submandibular, tonsillar, preauricular or posterior auricular adenopathy.     Left side of head: No submental, submandibular, tonsillar, preauricular or posterior auricular adenopathy.     Cervical: No cervical adenopathy.  Skin:    General: Skin is warm and dry.     Capillary Refill: Capillary refill takes less than 2 seconds.     Findings: No rash.  Neurological:     Mental Status: She is alert and oriented to person, place, and time.     Gait: Gait is intact.     Deep Tendon Reflexes: Reflexes are normal and symmetric.     Reflex Scores:      Brachioradialis reflexes are 2+ on the right side and 2+ on the left side.      Patellar reflexes are 2+ on the right side and 2+ on the left side. Psychiatric:        Attention and Perception: Attention normal.        Mood and Affect: Mood normal.        Speech: Speech normal.        Behavior: Behavior normal. Behavior is cooperative.        Thought Content: Thought content normal.        Judgment: Judgment normal.    Diabetic Foot Exam - Simple   Simple Foot Form Visual  Inspection  No deformities, no ulcerations, no other skin breakdown bilaterally: Yes Sensation Testing Intact to touch and monofilament testing bilaterally: Yes Pulse Check Posterior Tibialis and Dorsalis pulse intact bilaterally: Yes Comments     Results for orders placed or performed in visit on 11/05/21  Bayer DCA Hb A1c Waived  Result Value Ref Range   HB A1C (BAYER DCA - WAIVED) 5.7 (H) 4.8 - 5.6 %  Microalbumin, Urine Waived  Result Value Ref Range   Microalb, Ur Waived 10 0 - 19 mg/L   Creatinine, Urine Waived 50 10 - 300 mg/dL   Microalb/Creat Ratio <30 <30 mg/g  Comprehensive metabolic panel  Result Value Ref Range   Glucose 93 70 - 99 mg/dL   BUN 8 6 - 24 mg/dL   Creatinine, Ser 0.62 0.57 - 1.00 mg/dL   eGFR 107 >59 mL/min/1.73   BUN/Creatinine Ratio 13 9 - 23   Sodium 138 134 - 144 mmol/L   Potassium 4.1 3.5 - 5.2 mmol/L   Chloride 100 96 - 106 mmol/L   CO2 24 20 - 29 mmol/L   Calcium 9.8 8.7 - 10.2 mg/dL   Total Protein 7.3 6.0 - 8.5 g/dL   Albumin 4.5 3.8 - 4.9 g/dL   Globulin, Total 2.8 1.5 - 4.5 g/dL   Albumin/Globulin Ratio 1.6 1.2 - 2.2   Bilirubin Total 0.4 0.0 - 1.2 mg/dL   Alkaline Phosphatase 83 44 - 121 IU/L   AST 22 0 - 40 IU/L   ALT 20 0 - 32 IU/L  Lipid Panel w/o Chol/HDL Ratio  Result Value Ref Range   Cholesterol, Total 146 100 - 199 mg/dL   Triglycerides 46 0 - 149 mg/dL   HDL 67 >39 mg/dL   VLDL Cholesterol Cal 10 5 - 40 mg/dL   LDL Chol Calc (NIH) 69 0 - 99 mg/dL      Assessment & Plan:   Problem List Items Addressed This Visit       Cardiovascular and Mediastinum   Hypertension associated with diabetes ([place])    Chronic, stable with BP at goal in office today.  Continue Lisinopril, which offers kidney protection.  Urine micro ALB 10 and A:C <04 November 2021.  Recommend she check BP at home at least 3 mornings a week and focus on DASH diet.  LABS: CBC, CMP, TSH.  Refills sent in. Return in 3 months.      Relevant Medications    Semaglutide,0.25 or 0.5MG /DOS, (OZEMPIC, 0.25 OR 0.5 MG/DOSE,) 2 MG/1.5ML SOPN   lisinopril (ZESTRIL) 10 MG tablet   simvastatin (ZOCOR) 40 MG tablet   Other Relevant Orders   Bayer DCA Hb A1c Waived   CBC with Differential/Platelet   Comprehensive metabolic panel   TSH   MITRAL REGURGITATION    Noted on echo in 2009, no current symptoms, monitor closely.      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  Urine ALB 10 and A:C <04 November 2021.  Will continue off Saxenda and start Ozempic which may benefit weight loss further and diabetes, educated her on this.  Recommend she continue to monitor BS at home with goal <130 in morning and <180 two hours after a meal - if elevations she is to notify provider and we may restart Metformin.  Return  in 3 months for A1c check.      Relevant Medications   Semaglutide,0.25 or 0.5MG/DOS, (OZEMPIC, 0.25 OR 0.5 MG/DOSE,) 2 MG/1.5ML SOPN   lisinopril (ZESTRIL) 10 MG tablet   simvastatin (ZOCOR) 40 MG tablet   Other Relevant Orders   Bayer DCA Hb A1c Waived   Hyperlipidemia associated with type 2 diabetes mellitus (HCC)    Chronic, stable.  Continue current medication regimen and adjust as needed.  Lipid panel today.      Relevant Medications   Semaglutide,0.25 or 0.5MG/DOS, (OZEMPIC, 0.25 OR 0.5 MG/DOSE,) 2 MG/1.5ML SOPN   lisinopril (ZESTRIL) 10 MG tablet   simvastatin (ZOCOR) 40 MG tablet   Other Relevant Orders   Bayer DCA Hb A1c Waived   Comprehensive metabolic panel   Lipid Panel w/o Chol/HDL Ratio     Other   Nicotine dependence, cigarettes, uncomplicated    Start patches, wishes to trial these.  Educated on all methods and this is her preference.  Continue annual lung screening.      Relevant Medications   nicotine (NICODERM CQ - DOSED IN MG/24 HOURS)  21 mg/24hr patch   nicotine (NICODERM CQ - DOSED IN MG/24 HOURS) 14 mg/24hr patch (Start on 06/27/2022)   nicotine (NICODERM CQ - DOSED IN MG/24 HR) 7 mg/24hr patch (Start on 07/12/2022)   Obesity    BMI 32.89.  Recommended eating smaller high protein, low fat meals more frequently and exercising 30 mins a day 5 times a week with a goal of 10-15lb weight loss in the next 3 months. Patient voiced their understanding and motivation to adhere to these recommendations.       Relevant Medications   Semaglutide,0.25 or 0.5MG/DOS, (OZEMPIC, 0.25 OR 0.5 MG/DOSE,) 2 MG/1.5ML SOPN   Situational anxiety    Due to mother with dementia, stable at this time.  Continue Vistaril as needed.  Educated her on these and side effects + use.   Alert provider if any issues.  Denies SI/HI.        Relevant Medications   hydrOXYzine (VISTARIL) 25 MG capsule   Other Visit Diagnoses     Vitamin D deficiency       History of low levels reported, check on labs today and start supplement as needed.   Relevant Orders   VITAMIN D 25 Hydroxy (Vit-D Deficiency, Fractures)   Flu vaccine need       Flu vaccine in office today.   Relevant Orders   Flu Vaccine QUAD 6+ mos PF IM (Fluarix Quad PF) (Completed)   Encounter for annual physical exam       Annual physical today with labs and health maintenance reviewed, discussed with patient.        Follow up plan: Return in about 3 months (around 08/16/2022) for T2DM, HTN/HLD.   LABORATORY TESTING:  - Pap smear: up to date  IMMUNIZATIONS:   - Tdap: Tetanus vaccination status reviewed: last tetanus booster within 10 years. - Influenza: Administered today - Pneumovax: Up to date - Prevnar: Not applicable - COVID: Up to date - HPV: Not applicable - Shingrix vaccine: Refused  SCREENING: -Mammogram: Up to date  - Colonoscopy: Up to date  - Bone Density: Not applicable  -Hearing Test: Not applicable  -Spirometry: Not applicable   PATIENT COUNSELING:   Advised to  take 1 mg of folate supplement per day if capable of pregnancy.   Sexuality: Discussed sexually transmitted diseases, partner selection, use of condoms, avoidance of unintended pregnancy  and contraceptive alternatives.  Advised to avoid cigarette smoking.  I discussed with the patient that most people either abstain from alcohol or drink within safe limits (<=14/week and <=4 drinks/occasion for males, <=7/weeks and <= 3 drinks/occasion for females) and that the risk for alcohol disorders and other health effects rises proportionally with the number of drinks per week and how often a drinker exceeds daily limits.  Discussed cessation/primary prevention of drug use and availability of treatment for abuse.   Diet: Encouraged to adjust caloric intake to maintain  or achieve ideal body weight, to reduce intake of dietary saturated fat and total fat, to limit sodium intake by avoiding high sodium foods and not adding table salt, and to maintain adequate dietary potassium and calcium preferably from fresh fruits, vegetables, and low-fat dairy products.    Stressed the importance of regular exercise  Injury prevention: Discussed safety belts, safety helmets, smoke detector, smoking near bedding or upholstery.   Dental health: Discussed importance of regular tooth brushing, flossing, and dental visits.    NEXT PREVENTATIVE PHYSICAL DUE IN 1 YEAR. Return in about 3 months (around 08/16/2022) for T2DM, HTN/HLD.

## 2022-05-16 NOTE — Assessment & Plan Note (Signed)
Chronic, stable with current A1c 7.1% today, upward trend.  Urine ALB 10 and A:C <04 November 2021.  Will continue off Saxenda and start Ozempic which may benefit weight loss further and diabetes, educated her on this.  Recommend she continue to monitor BS at home with goal <130 in morning and <180 two hours after a meal - if elevations she is to notify provider and we may restart Metformin.  Return in 3 months for A1c check.

## 2022-05-16 NOTE — Assessment & Plan Note (Signed)
Chronic, stable.  Continue current medication regimen and adjust as needed.  Lipid panel today. 

## 2022-05-16 NOTE — Assessment & Plan Note (Signed)
Noted on echo in 2009, no current symptoms, monitor closely. 

## 2022-05-16 NOTE — Assessment & Plan Note (Signed)
Chronic, stable with BP at goal in office today.  Continue Lisinopril, which offers kidney protection.  Urine micro ALB 10 and A:C <04 November 2021.  Recommend she check BP at home at least 3 mornings a week and focus on DASH diet.  LABS: CBC, CMP, TSH.  Refills sent in. Return in 3 months.

## 2022-05-16 NOTE — Assessment & Plan Note (Signed)
BMI 32.89.  Recommended eating smaller high protein, low fat meals more frequently and exercising 30 mins a day 5 times a week with a goal of 10-15lb weight loss in the next 3 months. Patient voiced their understanding and motivation to adhere to these recommendations. ? ?

## 2022-05-16 NOTE — Assessment & Plan Note (Signed)
Start patches, wishes to trial these.  Educated on all methods and this is her preference.  Continue annual lung screening.

## 2022-05-16 NOTE — Assessment & Plan Note (Signed)
Due to mother with dementia, stable at this time.  Continue Vistaril as needed.  Educated her on these and side effects + use.   Alert provider if any issues.  Denies SI/HI.   

## 2022-05-17 ENCOUNTER — Encounter: Payer: Self-pay | Admitting: Nurse Practitioner

## 2022-05-17 DIAGNOSIS — E559 Vitamin D deficiency, unspecified: Secondary | ICD-10-CM | POA: Insufficient documentation

## 2022-05-17 LAB — CBC WITH DIFFERENTIAL/PLATELET
Basophils Absolute: 0.1 10*3/uL (ref 0.0–0.2)
Basos: 1 %
EOS (ABSOLUTE): 0.3 10*3/uL (ref 0.0–0.4)
Eos: 4 %
Hematocrit: 42.3 % (ref 34.0–46.6)
Hemoglobin: 14.2 g/dL (ref 11.1–15.9)
Immature Grans (Abs): 0 10*3/uL (ref 0.0–0.1)
Immature Granulocytes: 0 %
Lymphocytes Absolute: 1.5 10*3/uL (ref 0.7–3.1)
Lymphs: 17 %
MCH: 32.7 pg (ref 26.6–33.0)
MCHC: 33.6 g/dL (ref 31.5–35.7)
MCV: 98 fL — ABNORMAL HIGH (ref 79–97)
Monocytes Absolute: 0.6 10*3/uL (ref 0.1–0.9)
Monocytes: 7 %
Neutrophils Absolute: 6.3 10*3/uL (ref 1.4–7.0)
Neutrophils: 71 %
Platelets: 268 10*3/uL (ref 150–450)
RBC: 4.34 x10E6/uL (ref 3.77–5.28)
RDW: 11.6 % — ABNORMAL LOW (ref 11.7–15.4)
WBC: 8.8 10*3/uL (ref 3.4–10.8)

## 2022-05-17 LAB — COMPREHENSIVE METABOLIC PANEL
ALT: 25 IU/L (ref 0–32)
AST: 21 IU/L (ref 0–40)
Albumin/Globulin Ratio: 2 (ref 1.2–2.2)
Albumin: 4.8 g/dL (ref 3.8–4.9)
Alkaline Phosphatase: 96 IU/L (ref 44–121)
BUN/Creatinine Ratio: 13 (ref 9–23)
BUN: 8 mg/dL (ref 6–24)
Bilirubin Total: 0.4 mg/dL (ref 0.0–1.2)
CO2: 21 mmol/L (ref 20–29)
Calcium: 9.8 mg/dL (ref 8.7–10.2)
Chloride: 100 mmol/L (ref 96–106)
Creatinine, Ser: 0.63 mg/dL (ref 0.57–1.00)
Globulin, Total: 2.4 g/dL (ref 1.5–4.5)
Glucose: 178 mg/dL — ABNORMAL HIGH (ref 70–99)
Potassium: 4.1 mmol/L (ref 3.5–5.2)
Sodium: 140 mmol/L (ref 134–144)
Total Protein: 7.2 g/dL (ref 6.0–8.5)
eGFR: 107 mL/min/{1.73_m2} (ref >59)

## 2022-05-17 LAB — LIPID PANEL W/O CHOL/HDL RATIO
Cholesterol, Total: 165 mg/dL (ref 100–199)
HDL: 48 mg/dL (ref >39)
LDL Chol Calc (NIH): 89 mg/dL (ref 0–99)
Triglycerides: 162 mg/dL — ABNORMAL HIGH (ref 0–149)
VLDL Cholesterol Cal: 28 mg/dL (ref 5–40)

## 2022-05-17 LAB — TSH: TSH: 2.58 u[IU]/mL (ref 0.450–4.500)

## 2022-05-17 LAB — VITAMIN D 25 HYDROXY (VIT D DEFICIENCY, FRACTURES): Vit D, 25-Hydroxy: 24.5 ng/mL — ABNORMAL LOW (ref 30.0–100.0)

## 2022-05-17 NOTE — Progress Notes (Signed)
Contacted via MyChart   Good evening Holly Morales, your labs have returned: - Cholesterol labs stable, continue Simvastatin daily. - Vitamin D level a little low, would recommend taking Vitamin D3 2000 units daily for bone health. - CBC shows no anemia or infection. - Kidney function, creatinine and eGFR, remains normal, as is liver function, AST and ALT.  Thyroid lab normal.  Any questions? Keep being awesome!!  Thank you for allowing me to participate in your care.  I appreciate you. Kindest regards, Claribel Sachs

## 2022-05-20 ENCOUNTER — Encounter: Payer: Self-pay | Admitting: Nurse Practitioner

## 2022-05-20 ENCOUNTER — Other Ambulatory Visit: Payer: Self-pay | Admitting: Nurse Practitioner

## 2022-05-20 MED ORDER — NICOTINE 21 MG/24HR TD PT24: 21.0000 mg | MEDICATED_PATCH | Freq: Every day | TRANSDERMAL | 0 refills | Status: AC

## 2022-06-16 LAB — HM DIABETES EYE EXAM

## 2022-06-20 ENCOUNTER — Encounter: Payer: Self-pay | Admitting: Nurse Practitioner

## 2022-08-13 NOTE — Patient Instructions (Signed)
Diabetes Mellitus and Skin Care Diabetes, also called diabetes mellitus, can lead to skin problems. If blood sugar (glucose) is not well controlled, it can cause problems over time. These problems include: Damage to nerves. This can affect your ability to feel wounds. This means you may not notice small skin injuries that could lead to bigger problems. This can also decrease the amount that you sweat, causing dry skin. Damage to blood vessels. The lack of blood flow can cause skin to break down. It can also slow healing time, which can lead to infections. Areas of skin that become thick or discolored. Common skin conditions There are certain skin conditions that often affect people with diabetes. These include: Dry skin. Thin skin. The skin on the feet may get thinner, break more easily, and heal more slowly than normal. Skin infections from bacteria. These include: Styes. These are infections near the eyelid. Boils. These are bumps filled with pus. Infected hair follicles. Infections of the skin around the nails. Fungal skin infections. These are most common in areas where skin rubs together, such as in the armpits or under the breasts. Common skin changes Diabetes can also cause the skin to change. You may develop: Dark, velvety markings on your skin. These may appear on your face, neck, armpits, inner thighs, and groin. Red, raised, scar-like tissue that may itch, feel painful, or become a wound. Blisters on your feet, toes, hands, or fingers. Thick, wax-like areas of skin. In most cases, these occur on the hands, forehead, or toes. Brown or red, ring-shaped or half-ring-shaped patches of skin on the ears or fingers. Pea-shaped, yellow bumps that may be itchy and have a red ring around them. This may affect your arms, feet, buttocks, and the top of your hands. Round, discolored patches of tan skin that do not hurt or itch. These may look like age spots. Supplies needed: Mild soap or  gentle skin cleanser. Lotion. How to care for dry, itchy skin Frequent high glucose levels can cause skin to become itchy. Poor blood circulation and skin infections can make dry skin worse. If you have dry, itchy skin: Avoid very hot showers and baths. Use mild soap and gentle skin cleansers. Do not use soap that is perfumed, harsh, or that dries your skin. Moisturizing soaps may help. Put on moisturizing lotion as soon as you finish bathing. Do not scratch dry skin. Scratching can expose skin to infection. If you have a rash or if your skin is very itchy, contact your health care provider. Skin that is red or covered in a rash may be a sign of an allergic reaction. Very itchy skin may mean that you need help to manage your diabetes better. You may also need treatment for an infection. General tips Most skin problems can be prevented or treated easily if caught early. Talk with your health care provider if you have any concerns. General tips include: Check your skin every day for cuts, bruises, redness, blisters, or sores, especially on your feet. If you cannot see the bottom of your feet, use a mirror or ask someone for help. Tell your health care provider if you have any of these injuries and if they are healing slowly. Keep your skin clean and dry. Do not use hot water. Moisturize your skin to prevent chapping. Keep your blood glucose levels within target range. Follow these instructions at home:  Take over-the-counter and prescription medicines only as told by your health care provider. This includes all diabetes medicines   you are taking. Schedule a foot exam with your health care provider once a year. During the exam, the structure and skin of your feet will be checked for problems. Make sure that your health care provider does a visual foot exam at every visit. If you get a skin injury, such as a cut, blister, or sore, check the area every day for signs of infection. Check for: Redness,  swelling, or pain. Fluid or blood. Warmth. Pus or a bad smell. Do not use any products that contain nicotine or tobacco. These products include cigarettes, chewing tobacco, and vaping devices, such as e-cigarettes. If you need help quitting, ask your health care provider. Where to find more information American Diabetes Association: diabetes.org Association of Diabetes Care & Education Specialists: diabeteseducator.org Contact a health care provider if: You get a cut or sore, especially on your feet. You have signs of infection after a skin injury. You have itchy skin that turns red or develops a rash. You have discolored areas of skin. You have places on your skin that change. They may thicken or appear shiny. This information is not intended to replace advice given to you by your health care provider. Make sure you discuss any questions you have with your health care provider. Document Revised: 01/26/2022 Document Reviewed: 01/26/2022 Elsevier Patient Education  2023 Elsevier Inc.  

## 2022-08-16 ENCOUNTER — Ambulatory Visit: Payer: BC Managed Care – PPO | Admitting: Nurse Practitioner

## 2022-08-16 ENCOUNTER — Encounter: Payer: Self-pay | Admitting: Nurse Practitioner

## 2022-08-16 VITALS — BP 129/84 | HR 87 | Temp 98.5°F | Ht 66.5 in | Wt 200.8 lb

## 2022-08-16 DIAGNOSIS — E1159 Type 2 diabetes mellitus with other circulatory complications: Secondary | ICD-10-CM

## 2022-08-16 DIAGNOSIS — M545 Low back pain, unspecified: Secondary | ICD-10-CM

## 2022-08-16 DIAGNOSIS — I080 Rheumatic disorders of both mitral and aortic valves: Secondary | ICD-10-CM

## 2022-08-16 DIAGNOSIS — G8929 Other chronic pain: Secondary | ICD-10-CM | POA: Insufficient documentation

## 2022-08-16 DIAGNOSIS — J432 Centrilobular emphysema: Secondary | ICD-10-CM

## 2022-08-16 DIAGNOSIS — E1140 Type 2 diabetes mellitus with diabetic neuropathy, unspecified: Secondary | ICD-10-CM

## 2022-08-16 DIAGNOSIS — E6609 Other obesity due to excess calories: Secondary | ICD-10-CM

## 2022-08-16 DIAGNOSIS — Z6832 Body mass index (BMI) 32.0-32.9, adult: Secondary | ICD-10-CM

## 2022-08-16 DIAGNOSIS — F17210 Nicotine dependence, cigarettes, uncomplicated: Secondary | ICD-10-CM

## 2022-08-16 DIAGNOSIS — E1169 Type 2 diabetes mellitus with other specified complication: Secondary | ICD-10-CM | POA: Diagnosis not present

## 2022-08-16 DIAGNOSIS — I152 Hypertension secondary to endocrine disorders: Secondary | ICD-10-CM

## 2022-08-16 DIAGNOSIS — F418 Other specified anxiety disorders: Secondary | ICD-10-CM

## 2022-08-16 DIAGNOSIS — E785 Hyperlipidemia, unspecified: Secondary | ICD-10-CM

## 2022-08-16 DIAGNOSIS — F40243 Fear of flying: Secondary | ICD-10-CM | POA: Insufficient documentation

## 2022-08-16 DIAGNOSIS — I700 Atherosclerosis of aorta: Secondary | ICD-10-CM | POA: Insufficient documentation

## 2022-08-16 LAB — MICROALBUMIN, URINE WAIVED
Creatinine, Urine Waived: 300 mg/dL (ref 10–300)
Microalb, Ur Waived: 30 mg/L — ABNORMAL HIGH (ref 0–19)
Microalb/Creat Ratio: <30 mg/g (ref <30)

## 2022-08-16 LAB — BAYER DCA HB A1C WAIVED: HB A1C (BAYER DCA - WAIVED): 6.7 % — ABNORMAL HIGH (ref 4.8–5.6)

## 2022-08-16 MED ORDER — SCOPOLAMINE 1 MG/3DAYS TD PT72: 1.0000 | MEDICATED_PATCH | TRANSDERMAL | 0 refills | Status: DC

## 2022-08-16 MED ORDER — SEMAGLUTIDE (1 MG/DOSE) 4 MG/3ML ~~LOC~~ SOPN: 1.0000 mg | PEN_INJECTOR | SUBCUTANEOUS | 4 refills | Status: DC

## 2022-08-16 MED ORDER — AZITHROMYCIN 250 MG PO TABS: ORAL_TABLET | ORAL | 0 refills | Status: AC

## 2022-08-16 MED ORDER — PREDNISONE 10 MG PO TABS: ORAL_TABLET | ORAL | 0 refills | Status: DC

## 2022-08-16 MED ORDER — ALPRAZOLAM 0.5 MG PO TABS: 0.5000 mg | ORAL_TABLET | Freq: Two times a day (BID) | ORAL | 0 refills | Status: DC | PRN

## 2022-08-16 NOTE — Assessment & Plan Note (Signed)
Chronic, stable.  Continue current medication regimen and adjust as needed.  Lipid panel today. 

## 2022-08-16 NOTE — Assessment & Plan Note (Signed)
Due to mother with dementia, stable at this time.  Continue Vistaril as needed.  Educated her on these and side effects + use.   Alert provider if any issues.  Denies SI/HI.

## 2022-08-16 NOTE — Assessment & Plan Note (Signed)
Chronic, ongoing.  Current acute exacerbation, will treat with Azithromycin and Prednisone, discussed with patient.  If worsening or ongoing to alert provider immediately.  No current inhalers, consider these in future.  Spirometry next visit.  Recommend complete cessation of smoking.

## 2022-08-16 NOTE — Assessment & Plan Note (Signed)
Noted on echo in 2009, no current symptoms, monitor closely.

## 2022-08-16 NOTE — Assessment & Plan Note (Signed)
BMI 31.93 with some loss with Ozempic.  Recommended eating smaller high protein, low fat meals more frequently and exercising 30 mins a day 5 times a week with a goal of 10-15lb weight loss in the next 3 months. Patient voiced their understanding and motivation to adhere to these recommendations.

## 2022-08-16 NOTE — Assessment & Plan Note (Signed)
Chronic, noted on CT lung imaging.  Recommend continue statin therapy and recommend complete cessation of smoking.

## 2022-08-16 NOTE — Assessment & Plan Note (Signed)
Chronic, wit acute exacerbation.  Start Prednisone taper, she is to monitor sugars closely with this.  Tylenol as needed.  Avoid Ibuprofen.  Voltaren gel and Icy/Hot patches as needed.  Heating pad and ice.  Recommend TENS unit at home.  Return to ortho or PT when patient prefers.

## 2022-08-16 NOTE — Progress Notes (Signed)
BP 129/84   Pulse 87   Temp 98.5 F (36.9 C) (Oral)   Ht 5' 6.5" (1.689 m)   Wt 200 lb 12.8 oz (91.1 kg)   LMP 11/06/2017 (Approximate)   SpO2 98%   BMI 31.93 kg/m    Subjective:    Patient ID: Holly Morales, female    DOB: 17-Aug-1970, 52 y.o.   MRN: [id number]  HPI: Holly Morales is a 52 y.o. female  Chief Complaint  Patient presents with   Diabetes   Hypertension   Hyperlipidemia   Back Pain    Wants prednisone   Cough    Started a last week sometime   New Med Request    Wants something for her nerves when she gets on a plane and something for motion sickness   DIABETES A1c in October was 7%. We have changed over to Ozempic, current 0.5 MG -- tolerating this well, only occasional nausea morning she takes it.   Hypoglycemic episodes:no Polydipsia/polyuria: no Visual disturbance: no Chest pain: no Paresthesias: no Glucose Monitoring: yes             Accucheck frequency: every other day             Fasting glucose: 110-120 range             Post prandial:             Evening:             Before meals: Taking Insulin?: no             Long acting insulin:             Short acting insulin: Blood Pressure Monitoring: not checking Retinal Examination: Up To Date -- [facility] Foot Exam: Up to Date Pneumovax: Up to Date Influenza: Up to Date Aspirin: yes  COPD Went for initial lung screen 01/12/22 noting emphysema and aortic atherosclerosis. Continues to smoke at this time <1 PPD -- has been smoking for about 30 years.  Was 43 when started smoking.    Has had a cough for one week with rhinorrhea and congestion.  Clear phlegm coming up.  No fever.   COPD status: stable Satisfied with current treatment?: yes Oxygen use: no Dyspnea frequency: with activity Cough frequency: yes Rescue inhaler frequency:  no Limitation of activity: no Productive cough: as above Last Spirometry: none Pneumovax: Up to Date Influenza: Up to Date    HYPERTENSION /  HYPERLIPIDEMIA Continues on Simvastatin for HLD and Lisinopril for HTN.  Satisfied with current treatment? yes Duration of hypertension: chronic BP monitoring frequency: not checking BP range:  BP medication side effects: no Duration of hyperlipidemia: chronic Cholesterol medication side effects: no Cholesterol supplements: none Medication compliance: good compliance Aspirin: no Recent stressors: no Recurrent headaches: no Visual changes: no Palpitations: no Dyspnea: no Chest pain: no Lower extremity edema: no Dizzy/lightheaded: no  BACK PAIN Has chronic back pain with acute flares.  Current acute flare at present.  Has back pain every day.  History of imaging reported with scoliosis.  Had injections to hips in past.  Does heavy lifting at work.   Duration:  chronic Mechanism of injury: no trauma Location: bilateral and low back Onset: gradual Severity: 8/10 Quality: dull, aching, and throbbing Frequency: intermittent Radiation: L leg above the knee Aggravating factors: lifting, movement, and bending Alleviating factors:  Voltaren, rest, ice, heat, NSAIDs, and APAP Status: fluctuating Treatments attempted: as above, ortho, and PT  Relief with  NSAIDs?: mild Nighttime pain:  no Paresthesias / decreased sensation:  no Bowel / bladder incontinence:  no Fevers:  no Dysuria / urinary frequency:  no   ANXIETY/STRESS Mother is struggling with dementia, having lots of anxiety with this.  She is requesting something for nerves due to going on plane soon + nausea medication for going on cruise upcoming.  Taking Vistaril BID. Duration: stable Anxious mood: yes  Excessive worrying: yes Irritability: no Sweating: no Nausea: no Palpitations:no Hyperventilation: no Panic attacks: no Agoraphobia: no  Obscessions/compulsions: no Depressed mood: yes    08/29/2022    8:16 AM 05/16/2022    8:19 AM 12/17/2021    4:23 PM 11/05/2021   11:01 AM 01/25/2021    9:13 AM  Depression screen  PHQ 2/9  Decreased Interest 0 1 0 0 0  Down, Depressed, Hopeless 1 0 0 0 0  PHQ - 2 Score 1 1 0 0 0  Altered sleeping 1 2 0 0   Tired, decreased energy 1 2 1 2    Change in appetite 0 0 0 1   Feeling bad or failure about yourself  0 0 0 0   Trouble concentrating 0 0 0 0   Moving slowly or fidgety/restless 0 0 0 0   Suicidal thoughts 0 0 0 0   PHQ-9 Score 3 5 1 3    Difficult doing work/chores Not difficult at all Not difficult at all Somewhat difficult Not difficult at all   Anhedonia: no Weight changes: no Insomnia: yes hard to stay asleep  Hypersomnia: no Fatigue/loss of energy: no Feelings of worthlessness: no Feelings of guilt: no Impaired concentration/indecisiveness: no Suicidal ideations: no  Crying spells: no Recent Stressors/Life Changes: yes   Relationship problems: no   Family stress: yes     Financial stress: no    Job stress: no    Recent death/loss: no     08-29-22    8:17 AM 05/16/2022    8:19 AM 12/17/2021    4:23 PM 11/05/2021   11:02 AM  GAD 7 : Generalized Anxiety Score  Nervous, Anxious, on Edge 2 1 0 2  Control/stop worrying 1 0 0 2  Worry too much - different things 1 0 0 3  Trouble relaxing 0 0 0 1  Restless 1 0 0 0  Easily annoyed or irritable 0 0 0 3  Afraid - awful might happen 2 2 0 2  Total GAD 7 Score 7 3 0 13  Anxiety Difficulty Not difficult at all Not difficult at all Not difficult at all Somewhat difficult   Relevant past medical, surgical, family and social history reviewed and updated as indicated. Interim medical history since our last visit reviewed. Allergies and medications reviewed and updated.  Review of Systems  Constitutional:  Negative for activity change, appetite change, diaphoresis, fatigue and fever.  HENT:  Positive for congestion, postnasal drip, rhinorrhea, sinus pressure and sore throat (with drainage). Negative for ear discharge, ear pain and sinus pain.   Respiratory:  Positive for cough, shortness of breath and  wheezing. Negative for chest tightness.   Cardiovascular:  Negative for chest pain, palpitations and leg swelling.  Gastrointestinal: Negative.   Endocrine: Negative for cold intolerance, heat intolerance, polydipsia, polyphagia and polyuria.  Musculoskeletal:  Positive for back pain.  Neurological:  Negative for dizziness, syncope, weakness, light-headedness, numbness and headaches.  Psychiatric/Behavioral:  Positive for sleep disturbance. Negative for decreased concentration, self-injury and suicidal ideas. The patient is nervous/anxious.    Per HPI  unless specifically indicated above     Objective:    BP 129/84   Pulse 87   Temp 98.5 F (36.9 C) (Oral)   Ht 5' 6.5" (1.689 m)   Wt 200 lb 12.8 oz (91.1 kg)   LMP 11/06/2017 (Approximate)   SpO2 98%   BMI 31.93 kg/m   Wt Readings from Last 3 Encounters:  08/16/22 200 lb 12.8 oz (91.1 kg)  05/16/22 206 lb 14.4 oz (93.8 kg)  12/17/21 193 lb (87.5 kg)    Physical Exam Vitals and nursing note reviewed.  Constitutional:      General: She is awake. She is not in acute distress.    Appearance: She is well-developed and well-groomed. She is obese. She is not ill-appearing or toxic-appearing.  HENT:     Head: Normocephalic.     Right Ear: Hearing, ear canal and external ear normal. A middle ear effusion is present. There is no impacted cerumen. Tympanic membrane is not injected or perforated.     Left Ear: Hearing, ear canal and external ear normal. A middle ear effusion is present. There is no impacted cerumen. Tympanic membrane is not injected or perforated.     Ears:     Comments: Bilateral ears irrigated by CMA, full clearance after procedure and tolerate well.    Nose: Rhinorrhea present. Rhinorrhea is clear.     Right Sinus: No maxillary sinus tenderness or frontal sinus tenderness.     Left Sinus: No maxillary sinus tenderness or frontal sinus tenderness.     Mouth/Throat:     Mouth: Mucous membranes are moist.     Pharynx:  Posterior oropharyngeal erythema (mild) present. No pharyngeal swelling or oropharyngeal exudate.  Eyes:     General: Lids are normal.        Right eye: No discharge.        Left eye: No discharge.     Conjunctiva/sclera: Conjunctivae normal.     Pupils: Pupils are equal, round, and reactive to light.  Neck:     Thyroid: No thyromegaly.     Vascular: No carotid bruit.  Cardiovascular:     Rate and Rhythm: Normal rate and regular rhythm.     Heart sounds: Murmur heard.     Systolic murmur is present with a grade of 2/6.     No gallop.  Pulmonary:     Effort: Pulmonary effort is normal. No accessory muscle usage or respiratory distress.     Breath sounds: Wheezing present. No decreased breath sounds or rhonchi.     Comments: Expiratory wheezes intermittent throughout. Abdominal:     General: Bowel sounds are normal. There is no distension.     Palpations: Abdomen is soft.     Tenderness: There is no abdominal tenderness. There is no right CVA tenderness or left CVA tenderness.  Musculoskeletal:     Cervical back: Normal range of motion and neck supple.     Lumbar back: No swelling, spasms or tenderness. Normal range of motion. Negative right straight leg raise test and negative left straight leg raise test.     Right lower leg: No edema.     Left lower leg: No edema.  Lymphadenopathy:     Cervical: No cervical adenopathy.  Skin:    General: Skin is warm and dry.  Neurological:     Mental Status: She is alert and oriented to person, place, and time.  Psychiatric:        Attention and Perception: Attention normal.  Mood and Affect: Mood normal.        Speech: Speech normal.        Behavior: Behavior normal. Behavior is cooperative.        Thought Content: Thought content normal.    Results for orders placed or performed in visit on 08/16/22  Bayer DCA Hb A1c Waived  Result Value Ref Range   HB A1C (BAYER DCA - WAIVED) 6.7 (H) 4.8 - 5.6 %  Microalbumin, Urine Waived   Result Value Ref Range   Microalb, Ur Waived 30 (H) 0 - 19 mg/L   Creatinine, Urine Waived 300 10 - 300 mg/dL   Microalb/Creat Ratio <30 <30 mg/g      Assessment & Plan:   Problem List Items Addressed This Visit       Cardiovascular and Mediastinum   Aortic atherosclerosis (HCC)    Chronic, noted on CT lung imaging.  Recommend continue statin therapy and recommend complete cessation of smoking.      Hypertension associated with diabetes (HCC)    Chronic, stable.  BP at goal in office today.  Continue Lisinopril, which offers kidney protection.  Urine micro ALB 06 September 2022.  Recommend she check BP at home at least 3 mornings a week and focus on DASH diet.  LABS: CMP.  Return in 3 months.      Relevant Medications   Semaglutide, 1 MG/DOSE, 4 MG/3ML SOPN   Other Relevant Orders   Bayer DCA Hb A1c Waived (Completed)   Microalbumin, Urine Waived (Completed)   Comprehensive metabolic panel   MITRAL REGURGITATION    Noted on echo in 2009, no current symptoms, monitor closely.      Relevant Orders   Comprehensive metabolic panel   Lipid Panel w/o Chol/HDL Ratio     Respiratory   Centrilobular emphysema (HCC)    Chronic, ongoing.  Current acute exacerbation, will treat with Azithromycin and Prednisone, discussed with patient.  If worsening or ongoing to alert provider immediately.  No current inhalers, consider these in future.  Spirometry next visit.  Recommend complete cessation of smoking.      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  Urine ALB 06 September 2022.  Will continue off Saxenda and increase Ozempic to 1 MG as is tolerating.  Recommend she continue to monitor BS at home with goal <130 in morning and <180 two hours after a meal - if elevations she is to notify provider  and we may restart Metformin.  Return in 3 months for A1c check.      Relevant Medications   Semaglutide, 1 MG/DOSE, 4 MG/3ML SOPN   Other Relevant Orders   Bayer DCA Hb A1c Waived (Completed)   Microalbumin, Urine Waived (Completed)   Comprehensive metabolic panel   Hyperlipidemia associated with type 2 diabetes mellitus (HCC)    Chronic, stable.  Continue current medication regimen and adjust as needed.  Lipid panel today.      Relevant Medications   Semaglutide, 1 MG/DOSE, 4 MG/3ML SOPN   Other Relevant Orders   Bayer DCA Hb A1c Waived (Completed)   Comprehensive metabolic panel   Lipid Panel w/o Chol/HDL Ratio     Other   Chronic back pain    Chronic, wit acute exacerbation.  Start Prednisone taper, she is  to monitor sugars closely with this.  Tylenol as needed.  Avoid Ibuprofen.  Voltaren gel and Icy/Hot patches as needed.  Heating pad and ice.  Recommend TENS unit at home.  Return to ortho or PT when patient prefers.      Relevant Medications   predniSONE (DELTASONE) 10 MG tablet   Fear of flying    Ongoing issue, is about to go on cruise.  Order for Xanax, small amount, for flying to cruise boat.  Scopolamine patches sent for flying and cruise ship.      Relevant Medications   ALPRAZolam (XANAX) 0.5 MG tablet   Nicotine dependence, cigarettes, uncomplicated    I have recommended complete cessation of tobacco use. I have discussed various options available for assistance with tobacco cessation including over the counter methods (Nicotine gum, patch and lozenges). We also discussed prescription options (Chantix, Nicotine Inhaler / Nasal Spray). The patient is not interested in pursuing any prescription tobacco cessation options at this time. Continue annual lung screening.      Obesity    BMI 31.93 with some loss with Ozempic.  Recommended eating smaller high protein, low fat meals more frequently and exercising 30 mins a day 5 times a week with a goal of 10-15lb weight  loss in the next 3 months. Patient voiced their understanding and motivation to adhere to these recommendations.       Relevant Medications   Semaglutide, 1 MG/DOSE, 4 MG/3ML SOPN   Situational anxiety    Due to mother with dementia, stable at this time.  Continue Vistaril as needed.  Educated her on these and side effects + use.   Alert provider if any issues.  Denies SI/HI.        Relevant Medications   ALPRAZolam (XANAX) 0.5 MG tablet     Follow up plan: Return in about 3 months (around 11/15/2022) for T2DM, HTN/HLD, COPD, ANXIETY.

## 2022-08-16 NOTE — Assessment & Plan Note (Signed)
Chronic, stable with current A1c 6.7% today, downward trend.  Urine ALB 06 September 2022.  Will continue off Saxenda and increase Ozempic to 1 MG as is tolerating.  Recommend she continue to monitor BS at home with goal <130 in morning and <180 two hours after a meal - if elevations she is to notify provider and we may restart Metformin.  Return in 3 months for A1c check.

## 2022-08-16 NOTE — Assessment & Plan Note (Signed)
Chronic, stable.  BP at goal in office today.  Continue Lisinopril, which offers kidney protection.  Urine micro ALB 06 September 2022.  Recommend she check BP at home at least 3 mornings a week and focus on DASH diet.  LABS: CMP.  Return in 3 months.

## 2022-08-16 NOTE — Assessment & Plan Note (Signed)
Ongoing issue, is about to go on cruise.  Order for Xanax, small amount, for flying to cruise boat.  Scopolamine patches sent for flying and cruise ship.

## 2022-08-16 NOTE — Assessment & Plan Note (Signed)
I have recommended complete cessation of tobacco use. I have discussed various options available for assistance with tobacco cessation including over the counter methods (Nicotine gum, patch and lozenges). We also discussed prescription options (Chantix, Nicotine Inhaler / Nasal Spray). The patient is not interested in pursuing any prescription tobacco cessation options at this time.  Continue annual lung screening. 

## 2022-08-17 LAB — COMPREHENSIVE METABOLIC PANEL
ALT: 25 IU/L (ref 0–32)
AST: 19 IU/L (ref 0–40)
Albumin/Globulin Ratio: 2 (ref 1.2–2.2)
Albumin: 5.1 g/dL — ABNORMAL HIGH (ref 3.8–4.9)
Alkaline Phosphatase: 91 IU/L (ref 44–121)
BUN/Creatinine Ratio: 17 (ref 9–23)
BUN: 12 mg/dL (ref 6–24)
Bilirubin Total: 0.4 mg/dL (ref 0.0–1.2)
CO2: 24 mmol/L (ref 20–29)
Calcium: 10.3 mg/dL — ABNORMAL HIGH (ref 8.7–10.2)
Chloride: 99 mmol/L (ref 96–106)
Creatinine, Ser: 0.7 mg/dL (ref 0.57–1.00)
Globulin, Total: 2.5 g/dL (ref 1.5–4.5)
Glucose: 154 mg/dL — ABNORMAL HIGH (ref 70–99)
Potassium: 4.5 mmol/L (ref 3.5–5.2)
Sodium: 138 mmol/L (ref 134–144)
Total Protein: 7.6 g/dL (ref 6.0–8.5)
eGFR: 103 mL/min/{1.73_m2} (ref >59)

## 2022-08-17 LAB — LIPID PANEL W/O CHOL/HDL RATIO
Cholesterol, Total: 149 mg/dL (ref 100–199)
HDL: 50 mg/dL (ref >39)
LDL Chol Calc (NIH): 79 mg/dL (ref 0–99)
Triglycerides: 112 mg/dL (ref 0–149)
VLDL Cholesterol Cal: 20 mg/dL (ref 5–40)

## 2022-08-17 NOTE — Progress Notes (Signed)
Contacted via MyChart   Good evening Valree, your labs have returned: - Kidney function, creatinine and eGFR, remains normal, as is liver function, AST and ALT. Calcium a little high, if you are taking any calcium supplements daily then change to every other day. - Cholesterol labs remain stable, continue statin therapy.  Any questions? Keep being stellar!!  Thank you for allowing me to participate in your care.  I appreciate you. Kindest regards, Jalan Bodi

## 2022-10-04 ENCOUNTER — Other Ambulatory Visit: Payer: Self-pay | Admitting: Nurse Practitioner

## 2022-10-04 NOTE — Telephone Encounter (Signed)
Requested medication (s) are due for refill today: yes  Requested medication (s) are on the active medication list: yes  Last refill: 05/16/22 #90 2 refills  Future visit scheduled yes  11/17/22  Notes to clinic:Not delegated, please review. Thank you.  Requested Prescriptions  Pending Prescriptions Disp Refills   cyclobenzaprine (FLEXERIL) 5 MG tablet [Pharmacy Med Name: CYCLOBENZAPRINE 5 MG TABLET] 90 tablet 2    Sig: TAKE 1 TABLET BY MOUTH THREE TIMES A DAY AS NEEDED FOR MUSCLE SPASM     Not Delegated - Analgesics:  Muscle Relaxants Failed - 10/04/2022 12:07 PM      Failed - This refill cannot be delegated      Passed - Valid encounter within last 6 months    Recent Outpatient Visits           1 month ago Controlled type 2 diabetes with neuropathy ([place])   [place], More[place] T, NP   4 months ago Controlled type 2 diabetes with neuropathy ([place])   [place], Barbaraann Faster, NP   9 months ago Situational anxiety   [place], [place] T, NP   11 months ago Controlled type 2 diabetes with neuropathy [facility])   [place], Henrine Screws T, NP   1 year ago Acute non-recurrent maxillary sinusitis   [place] Jon [place], NP       Future Appointments             In 1 month [place], Barbaraann Faster, NP [place]

## 2022-10-06 ENCOUNTER — Other Ambulatory Visit: Payer: Self-pay | Admitting: Nurse Practitioner

## 2022-10-07 NOTE — Telephone Encounter (Signed)
Unable to refill per protocol, Rx request is too soon. Last refill 10/05/22 for 90 and 2 refills.  Requested Prescriptions  Pending Prescriptions Disp Refills   cyclobenzaprine (FLEXERIL) 5 MG tablet [Pharmacy Med Name: CYCLOBENZAPRINE 5 MG TABLET] 90 tablet 2    Sig: TAKE 1 TABLET BY MOUTH THREE TIMES A DAY AS NEEDED FOR MUSCLE SPASM     Not Delegated - Analgesics:  Muscle Relaxants Failed - 10/06/2022  2:15 PM      Failed - This refill cannot be delegated      Passed - Valid encounter within last 6 months    Recent Outpatient Visits           1 month ago Controlled type 2 diabetes with neuropathy ([place])   [place], [place] T, NP   4 months ago Controlled type 2 diabetes with neuropathy ([place])   [place], Barbaraann Faster, NP   9 months ago Situational anxiety   [place], [place] T, NP   11 months ago Controlled type 2 diabetes with neuropathy [facility])   [place], Henrine Screws T, NP   1 year ago Acute non-recurrent maxillary sinusitis   [place] Jon Billings, NP       Future Appointments             In 1 month [place], Barbaraann Faster, NP [place], [facility]

## 2022-11-13 NOTE — Patient Instructions (Signed)
Be Involved in Your Health Care:  Taking Medications When medications are taken as directed, they can greatly improve your health. But if they are not taken as instructed, they may not work. In some cases, not taking them correctly can be harmful. To help ensure your treatment remains effective and safe, understand your medications and how to take them.  Your lab results, notes and after visit summary will be available on My Chart. We strongly encourage you to use this feature. If lab results are abnormal the clinic will contact you with the appropriate steps. If the clinic does not contact you assume the results are satisfactory. You can always see your results on My Chart. If you have questions regarding your condition, please contact the clinic during office hours. You can also ask questions on My Chart.  We at [facility] are grateful that you chose us to provide care. We strive to provide excellent and compassionate care and are always looking for feedback. If you get a survey from the clinic please complete this.   Diabetes Mellitus and Nutrition, Adult When you have diabetes, or diabetes mellitus, it is very important to have healthy eating habits because your blood sugar (glucose) levels are greatly affected by what you eat and drink. Eating healthy foods in the right amounts, at about the same times every day, can help you: Manage your blood glucose. Lower your risk of heart disease. Improve your blood pressure. Reach or maintain a healthy weight. What can affect my meal plan? Every person with diabetes is different, and each person has different needs for a meal plan. Your health care provider may recommend that you work with a dietitian to make a meal plan that is best for you. Your meal plan may vary depending on factors such as: The calories you need. The medicines you take. Your weight. Your blood glucose, blood pressure, and cholesterol levels. Your activity  level. Other health conditions you have, such as heart or kidney disease. How do carbohydrates affect me? Carbohydrates, also called carbs, affect your blood glucose level more than any other type of food. Eating carbs raises the amount of glucose in your blood. It is important to know how many carbs you can safely have in each meal. This is different for every person. Your dietitian can help you calculate how many carbs you should have at each meal and for each snack. How does alcohol affect me? Alcohol can cause a decrease in blood glucose (hypoglycemia), especially if you use insulin or take certain diabetes medicines by mouth. Hypoglycemia can be a life-threatening condition. Symptoms of hypoglycemia, such as sleepiness, dizziness, and confusion, are similar to symptoms of having too much alcohol. Do not drink alcohol if: Your health care provider tells you not to drink. You are pregnant, may be pregnant, or are planning to become pregnant. If you drink alcohol: Limit how much you have to: 0-1 drink a day for women. 0-2 drinks a day for men. Know how much alcohol is in your drink. In the [place] one drink equals one 12 oz bottle of beer (355 mL), one 5 oz glass of wine (148 mL), or one 1 oz glass of hard liquor (44 mL). Keep yourself hydrated with water, diet soda, or unsweetened iced tea. Keep in mind that regular soda, juice, and other mixers may contain a lot of sugar and must be counted as carbs. What are tips for following this plan?  Reading food labels Start by checking the serving   size on the Nutrition Facts label of packaged foods and drinks. The number of calories and the amount of carbs, fats, and other nutrients listed on the label are based on one serving of the item. Many items contain more than one serving per package. Check the total grams (g) of carbs in one serving. Check the number of grams of saturated fats and trans fats in one serving. Choose foods that have a low amount  or none of these fats. Check the number of milligrams (mg) of salt (sodium) in one serving. Most people should limit total sodium intake to less than 2,300 mg per day. Always check the nutrition information of foods labeled as "low-fat" or "nonfat." These foods may be higher in added sugar or refined carbs and should be avoided. Talk to your dietitian to identify your daily goals for nutrients listed on the label. Shopping Avoid buying canned, pre-made, or processed foods. These foods tend to be high in fat, sodium, and added sugar. Shop around the outside edge of the grocery store. This is where you will most often find fresh fruits and vegetables, bulk grains, fresh meats, and fresh dairy products. Cooking Use low-heat cooking methods, such as baking, instead of high-heat cooking methods, such as deep frying. Cook using healthy oils, such as olive, canola, or sunflower oil. Avoid cooking with butter, cream, or high-fat meats. Meal planning Eat meals and snacks regularly, preferably at the same times every day. Avoid going long periods of time without eating. Eat foods that are high in fiber, such as fresh fruits, vegetables, beans, and whole grains. Eat 4-6 oz (112-168 g) of lean protein each day, such as lean meat, chicken, fish, eggs, or tofu. One ounce (oz) (28 g) of lean protein is equal to: 1 oz (28 g) of meat, chicken, or fish. 1 egg.  cup (62 g) of tofu. Eat some foods each day that contain healthy fats, such as avocado, nuts, seeds, and fish. What foods should I eat? Fruits Berries. Apples. Oranges. Peaches. Apricots. Plums. Grapes. Mangoes. Papayas. Pomegranates. Kiwi. Cherries. Vegetables Leafy greens, including lettuce, spinach, kale, chard, collard greens, mustard greens, and cabbage. Beets. Cauliflower. Broccoli. Carrots. Green beans. Tomatoes. Peppers. Onions. Cucumbers. Brussels sprouts. Grains Whole grains, such as whole-wheat or whole-grain bread, crackers, tortillas,  cereal, and pasta. Unsweetened oatmeal. Quinoa. Brown or wild rice. Meats and other proteins Seafood. Poultry without skin. Lean cuts of poultry and beef. Tofu. Nuts. Seeds. Dairy Low-fat or fat-free dairy products such as milk, yogurt, and cheese. The items listed above may not be a complete list of foods and beverages you can eat and drink. Contact a dietitian for more information. What foods should I avoid? Fruits Fruits canned with syrup. Vegetables Canned vegetables. Frozen vegetables with butter or cream sauce. Grains Refined white flour and flour products such as bread, pasta, snack foods, and cereals. Avoid all processed foods. Meats and other proteins Fatty cuts of meat. Poultry with skin. Breaded or fried meats. Processed meat. Avoid saturated fats. Dairy Full-fat yogurt, cheese, or milk. Beverages Sweetened drinks, such as soda or iced tea. The items listed above may not be a complete list of foods and beverages you should avoid. Contact a dietitian for more information. Questions to ask a health care provider Do I need to meet with a certified diabetes care and education specialist? Do I need to meet with a dietitian? What number can I call if I have questions? When are the best times to check my blood glucose?   Where to find more information: American Diabetes Association: diabetes.org Academy of Nutrition and Dietetics: eatright.org National Institute of Diabetes and Digestive and Kidney Diseases: niddk.nih.gov Association of Diabetes Care & Education Specialists: diabeteseducator.org Summary It is important to have healthy eating habits because your blood sugar (glucose) levels are greatly affected by what you eat and drink. It is important to use alcohol carefully. A healthy meal plan will help you manage your blood glucose and lower your risk of heart disease. Your health care provider may recommend that you work with a dietitian to make a meal plan that is best for  you. This information is not intended to replace advice given to you by your health care provider. Make sure you discuss any questions you have with your health care provider. Document Revised: 02/26/2020 Document Reviewed: 02/26/2020 Elsevier Patient Education  2023 Elsevier Inc.  

## 2022-11-17 ENCOUNTER — Encounter: Payer: Self-pay | Admitting: Nurse Practitioner

## 2022-11-17 ENCOUNTER — Ambulatory Visit: Payer: BC Managed Care – PPO | Admitting: Nurse Practitioner

## 2022-11-17 VITALS — BP 113/79 | HR 80 | Temp 98.5°F | Ht 66.5 in | Wt 194.3 lb

## 2022-11-17 DIAGNOSIS — I080 Rheumatic disorders of both mitral and aortic valves: Secondary | ICD-10-CM

## 2022-11-17 DIAGNOSIS — E1140 Type 2 diabetes mellitus with diabetic neuropathy, unspecified: Secondary | ICD-10-CM | POA: Diagnosis not present

## 2022-11-17 DIAGNOSIS — J432 Centrilobular emphysema: Secondary | ICD-10-CM | POA: Diagnosis not present

## 2022-11-17 DIAGNOSIS — I700 Atherosclerosis of aorta: Secondary | ICD-10-CM

## 2022-11-17 DIAGNOSIS — E1159 Type 2 diabetes mellitus with other circulatory complications: Secondary | ICD-10-CM | POA: Diagnosis not present

## 2022-11-17 DIAGNOSIS — Z6832 Body mass index (BMI) 32.0-32.9, adult: Secondary | ICD-10-CM

## 2022-11-17 DIAGNOSIS — E1169 Type 2 diabetes mellitus with other specified complication: Secondary | ICD-10-CM | POA: Diagnosis not present

## 2022-11-17 DIAGNOSIS — E538 Deficiency of other specified B group vitamins: Secondary | ICD-10-CM

## 2022-11-17 DIAGNOSIS — I152 Hypertension secondary to endocrine disorders: Secondary | ICD-10-CM

## 2022-11-17 DIAGNOSIS — E785 Hyperlipidemia, unspecified: Secondary | ICD-10-CM

## 2022-11-17 DIAGNOSIS — F17210 Nicotine dependence, cigarettes, uncomplicated: Secondary | ICD-10-CM

## 2022-11-17 DIAGNOSIS — E6609 Other obesity due to excess calories: Secondary | ICD-10-CM

## 2022-11-17 DIAGNOSIS — F418 Other specified anxiety disorders: Secondary | ICD-10-CM

## 2022-11-17 LAB — BAYER DCA HB A1C WAIVED: HB A1C (BAYER DCA - WAIVED): 6.3 % — ABNORMAL HIGH (ref 4.8–5.6)

## 2022-11-17 NOTE — Assessment & Plan Note (Signed)
Chronic, stable.  No current inhalers, consider these in future.  Spirometry next visit.  Recommend complete cessation of smoking.  Continue annual lung cancer screening.

## 2022-11-17 NOTE — Assessment & Plan Note (Signed)
Chronic, noted on CT lung imaging.  Recommend continue statin therapy and recommend complete cessation of smoking. 

## 2022-11-17 NOTE — Assessment & Plan Note (Signed)
Chronic, stable.  BP at goal in office today.  Continue Lisinopril, which offers kidney protection.  Urine micro ALB 06 September 2022.  Recommend she check BP at home at least 3 mornings a week and focus on DASH diet.  LABS: BMP.  Consider change from ACE to ARB in future due to underlying COPD.  Return in 3 months.

## 2022-11-17 NOTE — Assessment & Plan Note (Signed)
Chronic, stable with current A1c 6.7% last visit, recheck today.  Urine ALB 06 September 2022.  Will continue Ozempic 1 MG as is tolerating and consider increase to 2 MG next visit.  Recommend she continue to monitor BS at home with goal <130 in morning and <180 two hours after a meal - if elevations she is to notify provider and we may restart Metformin.  Return in 6 months for A1c check.

## 2022-11-17 NOTE — Assessment & Plan Note (Signed)
Chronic, stable.  Continue current medication regimen and adjust as needed.  Lipid panel today. 

## 2022-11-17 NOTE — Assessment & Plan Note (Signed)
BMI 30.89 with some loss with Ozempic.  Recommended eating smaller high protein, low fat meals more frequently and exercising 30 mins a day 5 times a week with a goal of 10-15lb weight loss in the next 3 months. Patient voiced their understanding and motivation to adhere to these recommendations.

## 2022-11-17 NOTE — Assessment & Plan Note (Signed)
I have recommended complete cessation of tobacco use. I have discussed various options available for assistance with tobacco cessation including over the counter methods (Nicotine gum, patch and lozenges). We also discussed prescription options (Chantix, Nicotine Inhaler / Nasal Spray). The patient is not interested in pursuing any prescription tobacco cessation options at this time.  Continue annual lung screening. 

## 2022-11-17 NOTE — Assessment & Plan Note (Signed)
Noted on echo in 2009, no current symptoms, monitor closely. 

## 2022-11-17 NOTE — Assessment & Plan Note (Signed)
Due to mother with dementia, stable at this time.  Continue Vistaril as needed.  Educated her on these and side effects + use.   Alert provider if any issues.  Denies SI/HI.   

## 2022-11-17 NOTE — Progress Notes (Signed)
BP 113/79   Pulse 80   Temp 98.5 F (36.9 C) (Oral)   Ht 5' 6.5" (1.689 m)   Wt 194 lb 4.8 oz (88.1 kg)   LMP 11/06/2017 (Approximate)   SpO2 98%   BMI 30.89 kg/m    Subjective:    Patient ID: Holly Morales, female    DOB: 1970/11/14, 52 y.o.   MRN: [id number]  HPI: Holly Morales is a 52 y.o. female  Chief Complaint  Patient presents with   Diabetes   Hypertension   Hyperlipidemia   COPD   Anxiety   DIABETES A1c 6.7% January. Taking Ozempic 1 MG weekly.  Has occasional nausea with this and lost 12 pounds.   Hypoglycemic episodes:no Polydipsia/polyuria: no Visual disturbance: no Chest pain: no Paresthesias: no Glucose Monitoring: yes             Accucheck frequency: every other day             Fasting glucose: 110-120 range             Post prandial:             Evening:             Before meals: Taking Insulin?: no             Long acting insulin:             Short acting insulin: Blood Pressure Monitoring: not checking Retinal Examination: Up To Date -- [facility] Foot Exam: Up to Date Pneumovax: Up to Date Influenza: Up to Date Aspirin: yes  COPD Initial lung screen 01/12/22 noting emphysema and aortic atherosclerosis. Continues to smoke about 1/2 PPD -- has been smoking for about 30 years.  Age 70 when started smoking.   COPD status: stable Satisfied with current treatment?: yes Oxygen use: no Dyspnea frequency: with activity Cough frequency: yes Rescue inhaler frequency:  no Limitation of activity: no Productive cough: as above Last Spirometry: none Pneumovax: Up to Date Influenza: Up to Date    HYPERTENSION / HYPERLIPIDEMIA Continues on Simvastatin for HLD and Lisinopril for HTN.  Satisfied with current treatment? yes Duration of hypertension: chronic BP monitoring frequency: not checking BP range:  BP medication side effects: no Duration of hyperlipidemia: chronic Cholesterol medication side effects: no Cholesterol supplements:  none Medication compliance: good compliance Aspirin: no Recent stressors: no Recurrent headaches: no Visual changes: no Palpitations: no Dyspnea: no Chest pain: no Lower extremity edema: no Dizzy/lightheaded: no  ANXIETY/STRESS Taking Vistaril BID.  Mother has dementia, having lots of anxiety with this.  Recently lost her dog of many years. Duration: stable Anxious mood: yes  Excessive worrying: yes Irritability: no Sweating: no Nausea: no Palpitations:no Hyperventilation: no Panic attacks: no Agoraphobia: no  Obscessions/compulsions: no Depressed mood: yes    11/17/2022    8:19 AM 08/16/2022    8:16 AM 05/16/2022    8:19 AM 12/17/2021    4:23 PM 11/05/2021   11:01 AM  Depression screen PHQ 2/9  Decreased Interest 0 0 1 0 0  Down, Depressed, Hopeless 0 1 0 0 0  PHQ - 2 Score 0 1 1 0 0  Altered sleeping 0 1 2 0 0  Tired, decreased energy 0 1 2 1 2   Change in appetite 0 0 0 0 1  Feeling bad or failure about yourself  0 0 0 0 0  Trouble concentrating 1 0 0 0 0  Moving slowly or fidgety/restless 0 0 0 0 0  Suicidal thoughts 0 0 0 0 0  PHQ-9 Score 1 3 5 1 3   Difficult doing work/chores Not difficult at all Not difficult at all Not difficult at all Somewhat difficult Not difficult at all  Anhedonia: no Weight changes: no Insomnia: yes hard to stay asleep  Hypersomnia: no Fatigue/loss of energy: no Feelings of worthlessness: no Feelings of guilt: no Impaired concentration/indecisiveness: no Suicidal ideations: no  Crying spells: no Recent Stressors/Life Changes: yes   Relationship problems: no   Family stress: yes     Financial stress: no    Job stress: no    Recent death/loss: no     12-10-2022    8:20 AM 08/16/2022    8:17 AM 05/16/2022    8:19 AM 12/17/2021    4:23 PM  GAD 7 : Generalized Anxiety Score  Nervous, Anxious, on Edge 2 2 1  0  Control/stop worrying 1 1 0 0  Worry too much - different things 1 1 0 0  Trouble relaxing 1 0 0 0  Restless 0 1 0 0   Easily annoyed or irritable 1 0 0 0  Afraid - awful might happen 1 2 2  0  Total GAD 7 Score 7 7 3  0  Anxiety Difficulty Somewhat difficult Not difficult at all Not difficult at all Not difficult at all   Relevant past medical, surgical, family and social history reviewed and updated as indicated. Interim medical history since our last visit reviewed. Allergies and medications reviewed and updated.  Review of Systems  Constitutional:  Negative for activity change, appetite change, diaphoresis, fatigue and fever.  Respiratory:  Negative for cough, chest tightness and shortness of breath.   Cardiovascular:  Negative for chest pain, palpitations and leg swelling.  Gastrointestinal: Negative.   Endocrine: Negative for cold intolerance, heat intolerance, polydipsia, polyphagia and polyuria.  Neurological:  Negative for dizziness, syncope, weakness, light-headedness, numbness and headaches.  Psychiatric/Behavioral: Negative.     Per HPI unless specifically indicated above     Objective:    BP 113/79   Pulse 80   Temp 98.5 F (36.9 C) (Oral)   Ht 5' 6.5" (1.689 m)   Wt 194 lb 4.8 oz (88.1 kg)   LMP 11/06/2017 (Approximate)   SpO2 98%   BMI 30.89 kg/m   Wt Readings from Last 3 Encounters:  Dec 10, 2022 194 lb 4.8 oz (88.1 kg)  08/16/22 200 lb 12.8 oz (91.1 kg)  05/16/22 206 lb 14.4 oz (93.8 kg)    Physical Exam Vitals and nursing note reviewed.  Constitutional:      General: She is awake. She is not in acute distress.    Appearance: She is well-developed and well-groomed. She is obese. She is not ill-appearing or toxic-appearing.  HENT:     Head: Normocephalic.     Right Ear: Hearing normal.     Left Ear: Hearing normal.     Nose: Nose normal.     Mouth/Throat:     Mouth: Mucous membranes are moist.  Eyes:     General: Lids are normal.        Right eye: No discharge.        Left eye: No discharge.     Conjunctiva/sclera: Conjunctivae normal.     Pupils: Pupils are equal,  round, and reactive to light.  Neck:     Thyroid: No thyromegaly.     Vascular: No carotid bruit or JVD.  Cardiovascular:     Rate and Rhythm: Normal rate and regular rhythm.  Heart sounds: Murmur heard.     Systolic murmur is present with a grade of 2/6.     No gallop.  Pulmonary:     Effort: Pulmonary effort is normal.     Breath sounds: Normal breath sounds.  Abdominal:     General: Bowel sounds are normal. There is no distension.     Palpations: Abdomen is soft.     Tenderness: There is no abdominal tenderness.  Musculoskeletal:     Cervical back: Normal range of motion and neck supple.     Right lower leg: No edema.     Left lower leg: No edema.  Lymphadenopathy:     Cervical: No cervical adenopathy.  Skin:    General: Skin is warm and dry.  Neurological:     Mental Status: She is alert and oriented to person, place, and time.  Psychiatric:        Attention and Perception: Attention normal.        Mood and Affect: Mood normal.        Behavior: Behavior normal. Behavior is cooperative.        Thought Content: Thought content normal.        Judgment: Judgment normal.    Results for orders placed or performed in visit on 08/16/22  Bayer DCA Hb A1c Waived  Result Value Ref Range   HB A1C (BAYER DCA - WAIVED) 6.7 (H) 4.8 - 5.6 %  Microalbumin, Urine Waived  Result Value Ref Range   Microalb, Ur Waived 30 (H) 0 - 19 mg/L   Creatinine, Urine Waived 300 10 - 300 mg/dL   Microalb/Creat Ratio <30 <30 mg/g  Comprehensive metabolic panel  Result Value Ref Range   Glucose 154 (H) 70 - 99 mg/dL   BUN 12 6 - 24 mg/dL   Creatinine, Ser 9.56 0.57 - 1.00 mg/dL   eGFR 213 >08 MV/HQI/6.96   BUN/Creatinine Ratio 17 9 - 23   Sodium 138 134 - 144 mmol/L   Potassium 4.5 3.5 - 5.2 mmol/L   Chloride 99 96 - 106 mmol/L   CO2 24 20 - 29 mmol/L   Calcium 10.3 (H) 8.7 - 10.2 mg/dL   Total Protein 7.6 6.0 - 8.5 g/dL   Albumin 5.1 (H) 3.8 - 4.9 g/dL   Globulin, Total 2.5 1.5 - 4.5  g/dL   Albumin/Globulin Ratio 2.0 1.2 - 2.2   Bilirubin Total 0.4 0.0 - 1.2 mg/dL   Alkaline Phosphatase 91 44 - 121 IU/L   AST 19 0 - 40 IU/L   ALT 25 0 - 32 IU/L  Lipid Panel w/o Chol/HDL Ratio  Result Value Ref Range   Cholesterol, Total 149 100 - 199 mg/dL   Triglycerides 295 0 - 149 mg/dL   HDL 50 >28 mg/dL   VLDL Cholesterol Cal 20 5 - 40 mg/dL   LDL Chol Calc (NIH) 79 0 - 99 mg/dL      Assessment & Plan:   Problem List Items Addressed This Visit       Cardiovascular and Mediastinum   Aortic atherosclerosis    Chronic, noted on CT lung imaging.  Recommend continue statin therapy and recommend complete cessation of smoking.      Hypertension associated with diabetes    Chronic, stable.  BP at goal in office today.  Continue Lisinopril, which offers kidney protection.  Urine micro ALB 06 September 2022.  Recommend she check BP at home at least 3 mornings a week and focus on DASH  diet.  LABS: BMP.  Consider change from ACE to ARB in future due to underlying COPD.  Return in 3 months.      Relevant Orders   Basic metabolic panel   Bayer DCA Hb [id number] Waived   MITRAL REGURGITATION    Noted on echo in 2009, no current symptoms, monitor closely.        Respiratory   Centrilobular emphysema    Chronic, stable.  No current inhalers, consider these in future.  Spirometry next visit.  Recommend complete cessation of smoking.  Continue annual lung cancer screening.          Endocrine   Controlled type 2 diabetes with neuropathy - Primary    Chronic, stable with current A1c 6.7% last visit, recheck today.  Urine ALB 06 September 2022.  Will continue Ozempic 1 MG as is tolerating and consider increase to 2 MG next visit.  Recommend she continue to monitor BS at home with goal <130 in morning and <180 two hours after a meal - if elevations she is to notify provider and we may restart Metformin.  Return in 6 months for A1c check.      Relevant Orders   Bayer DCA Hb A1c Waived    Hyperlipidemia associated with type 2 diabetes mellitus    Chronic, stable.  Continue current medication regimen and adjust as needed.  Lipid panel today.      Relevant Orders   Bayer DCA Hb A1c Waived   Lipid Panel w/o Chol/HDL Ratio     Other   Nicotine dependence, cigarettes, uncomplicated    I have recommended complete cessation of tobacco use. I have discussed various options available for assistance with tobacco cessation including over the counter methods (Nicotine gum, patch and lozenges). We also discussed prescription options (Chantix, Nicotine Inhaler / Nasal Spray). The patient is not interested in pursuing any prescription tobacco cessation options at this time. Continue annual lung screening.      Obesity    BMI 30.89 with some loss with Ozempic.  Recommended eating smaller high protein, low fat meals more frequently and exercising 30 mins a day 5 times a week with a goal of 10-15lb weight loss in the next 3 months. Patient voiced their understanding and motivation to adhere to these recommendations.       Situational anxiety    Due to mother with dementia, stable at this time.  Continue Vistaril as needed.  Educated her on these and side effects + use.   Alert provider if any issues.  Denies SI/HI.        Other Visit Diagnoses     B12 deficiency       History of low levels, recheck today and start supplement as needed.   Relevant Orders   Vitamin B12        Follow up plan: Return in about 6 months (around 05/19/2023) for Annual physical and diabetes checks after 05/17/23  -- need spirometry and Shingrix.

## 2022-11-18 ENCOUNTER — Encounter: Payer: Self-pay | Admitting: Nurse Practitioner

## 2022-11-18 LAB — BASIC METABOLIC PANEL
BUN/Creatinine Ratio: 19 (ref 9–23)
BUN: 12 mg/dL (ref 6–24)
CO2: 23 mmol/L (ref 20–29)
Calcium: 9.4 mg/dL (ref 8.7–10.2)
Chloride: 102 mmol/L (ref 96–106)
Creatinine, Ser: 0.64 mg/dL (ref 0.57–1.00)
Glucose: 112 mg/dL — ABNORMAL HIGH (ref 70–99)
Potassium: 4.4 mmol/L (ref 3.5–5.2)
Sodium: 142 mmol/L (ref 134–144)
eGFR: 106 mL/min/{1.73_m2} (ref >59)

## 2022-11-18 LAB — VITAMIN B12: Vitamin B-12: >2000 pg/mL — ABNORMAL HIGH (ref 232–1245)

## 2022-11-18 LAB — LIPID PANEL W/O CHOL/HDL RATIO
Cholesterol, Total: 120 mg/dL (ref 100–199)
HDL: 57 mg/dL (ref >39)
LDL Chol Calc (NIH): 49 mg/dL (ref 0–99)
Triglycerides: 64 mg/dL (ref 0–149)
VLDL Cholesterol Cal: 14 mg/dL (ref 5–40)

## 2022-11-18 NOTE — Progress Notes (Signed)
Contacted via MyChart   Good morning Holly Morales, your labs have returned: - Kidney function, creatinine and eGFR, remains normal. - B12 level a bit above normal, I recommend cut back on supplement to every other day dosing. - Cholesterol levels are at goal, continue current statin therapy.  Overall good labs.  Any questions? Keep being awesome!!  Thank you for allowing me to participate in your care.  I appreciate you. Kindest regards, Ludell Zacarias

## 2022-11-24 ENCOUNTER — Other Ambulatory Visit: Payer: Self-pay | Admitting: Nurse Practitioner

## 2022-11-24 NOTE — Telephone Encounter (Signed)
Requested medication (s) are due for refill today: yes  Requested medication (s) are on the active medication list: yes  Last refill:  10/05/22  Future visit scheduled: yes  Notes to clinic:  Unable to refill per protocol, cannot delegate.      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 [place], Corrie Dandy T, NP   3 months ago Controlled type 2 diabetes with neuropathy (HCC)   [place] [facility] [place], Corrie Dandy T, NP   6 months ago Controlled type 2 diabetes with neuropathy (HCC)   [place] [facility] [place], Dorie Rank, NP   11 months ago Situational anxiety   [place] [facility] [place], [place] T, NP   1 year ago Controlled type 2 diabetes with neuropathy (HCC)   [place] [facility] [place], Dorie Rank, NP       Future Appointments             In 5 months Cannady, Dorie Rank, NP [place] [facility], [facility]            Refused Prescriptions Disp Refills   SAXENDA 18 MG/3ML SOPN [Pharmacy Med Name: SAXENDA 18 MG/3 ML PEN]  6    Sig: INJECT 3 MG INTO THE SKIN DAILY.     Endocrinology:  Diabetes - GLP-1 Receptor Agonists Passed - 11/24/2022  6:34 AM      Passed - HBA1C is between 0 and 7.9 and within 180 days    HB A1C (BAYER DCA - WAIVED)  Date Value Ref Range Status  11/17/2022 6.3 (H) 4.8 - 5.6 % Final    Comment:             Prediabetes: 5.7 - 6.4          Diabetes: >6.4          Glycemic  control for adults with diabetes: <7.0          Passed - Valid encounter within last 6 months    Recent Outpatient Visits           1 week ago Controlled type 2 diabetes with neuropathy   [place] [facility] [place], [place] T, NP   3 months ago Controlled type 2 diabetes with neuropathy (HCC)   [place] [facility] [place], Corrie Dandy T, NP   6 months ago Controlled type 2 diabetes with neuropathy (HCC)   [place] [facility] [place], Dorie Rank, NP   11 months ago Situational anxiety   [place] [facility] [place], [place] T, NP   1 year ago Controlled type 2 diabetes with neuropathy [facility])   [place] [facility] Marjie Skiff, NP       Future Appointments             In 5 months [place], Mill[place]  T, NP [place] [facility], [facility]

## 2022-11-24 NOTE — Telephone Encounter (Signed)
Unable to refill per protocol, Rx expired. Discontinued 05/16/22.  Requested Prescriptions  Pending Prescriptions Disp Refills   SAXENDA 18 MG/3ML SOPN [Pharmacy Med Name: SAXENDA 18 MG/3 ML PEN]  6    Sig: INJECT 3 MG INTO THE SKIN DAILY.     Endocrinology:  Diabetes - GLP-1 Receptor Agonists Passed - 11/24/2022  6:34 AM      Passed - HBA1C is between 0 and 7.9 and within 180 days    HB A1C (BAYER DCA - WAIVED)  Date Value Ref Range Status  11/17/2022 6.3 (H) 4.8 - 5.6 % Final    Comment:             Prediabetes: 5.7 - 6.4          Diabetes: >6.4          Glycemic control for adults with diabetes: <7.0          Passed - Valid encounter within last 6 months    Recent Outpatient Visits           1 week ago Controlled type 2 diabetes with neuropathy   [place] [facility] [place], [place] T, NP   3 months ago Controlled type 2 diabetes with neuropathy (HCC)   [place] [facility] [place], Corrie Dandy T, NP   6 months ago Controlled type 2 diabetes with neuropathy (HCC)   [place] [facility] [place], Dorie Rank, NP   11 months ago Situational anxiety   [place] [facility] [place], [place] T, NP   1 year ago Controlled type 2 diabetes with neuropathy (HCC)   [place] [facility] [place], Dorie Rank, NP       Future Appointments             In 5 months Cannady, Dorie Rank, NP [place] [facility], [facility]             cyclobenzaprine (FLEXERIL) 5 MG tablet [Pharmacy Med Name: CYCLOBENZAPRINE 5 MG TABLET] 90 tablet 2    Sig: TAKE 1 TABLET BY MOUTH THREE TIMES A DAY AS NEEDED FOR MUSCLE SPASM     Not Delegated - Analgesics:  Muscle Relaxants Failed - 11/24/2022  6:34 AM      Failed - This refill cannot be delegated      Passed - Valid encounter within last 6 months    Recent Outpatient Visits           1 week ago Controlled type 2 diabetes with neuropathy   [place] [facility] [place], Corrie Dandy T, NP   3 months ago Controlled type 2 diabetes with neuropathy (HCC)   [place] [facility] [place], [place] T, NP   6 months ago Controlled type 2 diabetes with neuropathy (HCC)   [place] [facility] [place], Dorie Rank, NP   11 months ago Situational anxiety   [place] [facility] [place], [place] T, NP   1 year ago Controlled type 2 diabetes with neuropathy (HCC)   [place] [facility] [place], Dorie Rank, NP       Future Appointments             In 5 months Cannady, Dorie Rank, NP [place] [facility], [facility]

## 2023-01-02 ENCOUNTER — Other Ambulatory Visit: Payer: Self-pay | Admitting: Acute Care

## 2023-01-02 DIAGNOSIS — Z122 Encounter for screening for malignant neoplasm of respiratory organs: Secondary | ICD-10-CM

## 2023-01-02 DIAGNOSIS — F17210 Nicotine dependence, cigarettes, uncomplicated: Secondary | ICD-10-CM

## 2023-01-04 ENCOUNTER — Other Ambulatory Visit: Payer: Self-pay | Admitting: Nurse Practitioner

## 2023-01-04 NOTE — Telephone Encounter (Signed)
Requested Prescriptions  Pending Prescriptions Disp Refills   ibuprofen (ADVIL) 800 MG tablet [Pharmacy Med Name: IBUPROFEN 800 MG TABLET] 90 tablet 5    Sig: TAKE 1 TABLET BY MOUTH EVERY 8 HOURS AS NEEDED     Analgesics:  NSAIDS Failed - 01/04/2023  2:33 AM      Failed - Manual Review: Labs are only required if the patient has taken medication for more than 8 weeks.      Passed - Cr in normal range and within 360 days    Creatinine, Ser  Date Value Ref Range Status  11/17/2022 0.64 0.57 - 1.00 mg/dL Final         Passed - HGB in normal range and within 360 days    Hemoglobin  Date Value Ref Range Status  05/16/2022 14.2 11.1 - 15.9 g/dL Final         Passed - PLT in normal range and within 360 days    Platelets  Date Value Ref Range Status  05/16/2022 268 150 - 450 x10E3/uL Final         Passed - HCT in normal range and within 360 days    Hematocrit  Date Value Ref Range Status  05/16/2022 42.3 34.0 - 46.6 % Final         Passed - eGFR is 30 or above and within 360 days    GFR calc Af Amer  Date Value Ref Range Status  07/17/2020 124 >59 mL/min/1.73 Final    Comment:    **In accordance with recommendations from the NKF-ASN Task force,**   [facility] is in the process of updating its eGFR calculation to the   2021 CKD-EPI creatinine equation that estimates kidney function   without a race variable.    GFR calc non Af Amer  Date Value Ref Range Status  07/17/2020 108 >59 mL/min/1.73 Final   eGFR  Date Value Ref Range Status  11/17/2022 106 >59 mL/min/1.73 Final         Passed - Patient is not pregnant      Passed - Valid encounter within last 12 months    Recent Outpatient Visits           1 month ago Controlled type 2 diabetes with neuropathy (HCC)   [place] [facility] [place], Jolene T, NP   4 months ago Controlled type 2 diabetes with neuropathy (HCC)   [place] [facility] [place], Jolene T, NP   7 months ago Controlled  type 2 diabetes with neuropathy (HCC)   [place] [facility] [place], Dorie Rank, NP   1 year ago Situational anxiety   [place] [facility] [place], Corrie Dandy T, NP   1 year ago Controlled type 2 diabetes with neuropathy (HCC)   [place] [facility] [place], Dorie Rank, NP       Future Appointments             In 4 months Cannady, Dorie Rank, NP [place] [facility], [facility]

## 2023-01-19 ENCOUNTER — Ambulatory Visit
Admission: RE | Admit: 2023-01-19 | Discharge: 2023-01-19 | Disposition: A | Discharge status: Home / Self Care | Payer: BC Managed Care – PPO | Source: Ambulatory Visit | Attending: Acute Care | Admitting: Acute Care

## 2023-01-19 DIAGNOSIS — F17210 Nicotine dependence, cigarettes, uncomplicated: Secondary | ICD-10-CM

## 2023-01-19 DIAGNOSIS — Z122 Encounter for screening for malignant neoplasm of respiratory organs: Secondary | ICD-10-CM

## 2023-01-26 ENCOUNTER — Other Ambulatory Visit: Payer: Self-pay

## 2023-01-26 DIAGNOSIS — Z87891 Personal history of nicotine dependence: Secondary | ICD-10-CM

## 2023-01-26 DIAGNOSIS — Z122 Encounter for screening for malignant neoplasm of respiratory organs: Secondary | ICD-10-CM

## 2023-01-26 DIAGNOSIS — F17210 Nicotine dependence, cigarettes, uncomplicated: Secondary | ICD-10-CM

## 2023-02-22 ENCOUNTER — Encounter: Payer: Self-pay | Admitting: Nurse Practitioner

## 2023-03-13 ENCOUNTER — Encounter: Payer: Self-pay | Admitting: Nurse Practitioner

## 2023-03-14 ENCOUNTER — Telehealth: Payer: BC Managed Care – PPO | Admitting: Physician Assistant

## 2023-03-14 ENCOUNTER — Encounter: Payer: Self-pay | Admitting: Physician Assistant

## 2023-03-14 DIAGNOSIS — U071 COVID-19: Secondary | ICD-10-CM | POA: Diagnosis not present

## 2023-03-14 NOTE — Telephone Encounter (Signed)
Called and scheduled patient appointment for today at 2:40 pm with another provider.

## 2023-03-14 NOTE — Progress Notes (Unsigned)
Virtual Visit via Video Note  I connected with Holly Morales on 03/14/23 at  2:40 PM EDT by a video enabled telemedicine application and verified that I am speaking with the correct person using two identifiers.  Today's Provider: Jacquelin Hawking, MHS, PA-C Introduced myself to the patient as a PA-C and provided education on APPs in clinical practice.     Location: Patient: at home  Provider: [facility], [place], [place]    I discussed the limitations of evaluation and management by telemedicine and the availability of in person appointments. The patient expressed understanding and agreed to proceed.    Chief Complaint  Patient presents with   Covid Positive    Patient said she tested Sunday around lunch time. Patient says she is having cough and it is a "wet cough" sometimes. 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 Patient says she is having sinus like symptoms and they started Saturday. Patient says she has been taking extra strength Tylenol and Sudafed.     History of Present Illness:    COVID positive  Onset: sudden  Duration: ongoing since Saturday  Associated symptoms: productive cough, congestion,  Interventions:Tylenol and sudafed   She tested positive for COVID with home test on Sun  Reports her mother tested positive at the hospital a few days prior to her starting to have symptoms  BP was 133/77 She denies fevers or SOB     Review of Systems  Constitutional:  Positive for malaise/fatigue. Negative for chills and fever.  HENT:  Positive for congestion.   Eyes:  Negative for blurred vision and double vision.  Respiratory:  Positive for cough. Negative for shortness of breath and wheezing.   Cardiovascular:  Negative for chest pain, palpitations and leg swelling.  Gastrointestinal:  Positive for diarrhea (mild). Negative for nausea and vomiting.  Musculoskeletal:  Negative for  myalgias.  Neurological:  Positive for headaches.    Observations/Objective:  Due to the nature of the virtual visit, physical exam and observations are limited. Able to obtain the following observations:   Alert, oriented,x3  Appears comfortable, in no acute distress.  No scleral injection, no appreciated hoarseness, tachypnea, wheeze or strider. Able to maintain conversation without visible strain.  No cough appreciated during visit.    Assessment and Plan:   Problem List Items Addressed This Visit   None Visit Diagnoses     COVID-19    -  Primary Acute, new concern [name] tested positive for COVID on Sunday  Symptoms started around Sat  We discussed Paxlovid but she is currently at day 4/5 of symptoms- explained that there is likely limited benefit to start antivirals at this time  Recommend symptomatic management at this time with OTC medications Discussed appropriate options, return and ED precautions Follow up as needed for progressing or persistent symptoms        Follow Up Instructions:    I discussed the assessment and treatment plan with the patient. The patient was provided an opportunity to ask questions and all were answered. The patient agreed with the plan and demonstrated an understanding of the instructions.   The patient was advised to call back or seek an in-person evaluation if the symptoms worsen or if the condition fails to improve as anticipated.  I provided 17 minutes of non-face-to-face time during this encounter.  No follow-ups on file.   I, Avrianna Smart E Soyla Bainter, PA-C, have  reviewed all documentation for this visit. The documentation on 03/14/23 for the exam, diagnosis, procedures, and orders are all accurate and complete.   Jacquelin Hawking, MHS, PA-C [facility]

## 2023-03-14 NOTE — Patient Instructions (Addendum)
  The goal of treatment at this time is to reduce your symptoms and discomfort    You can use over the counter medications such as Dayquil/Nyquil, AlkaSeltzer formulations, etc to provide further relief of symptoms according to the manufacturer's instructions   If you prefer you can take Mucinex, Robitussin and Flonase to further assist with symptoms if you do not want to take Dayquil or multi-medication products    If your symptoms do not improve or become worse in the next 5-7 days please make an apt at the office so we can see you  Go to the ER if you begin to have more serious symptoms such as shortness of breath, trouble breathing, loss of consciousness, swelling around the eyes, high fever, severe lasting headaches, vision changes or neck pain/stiffness.

## 2023-04-03 ENCOUNTER — Other Ambulatory Visit: Payer: Self-pay | Admitting: Nurse Practitioner

## 2023-04-03 DIAGNOSIS — Z1231 Encounter for screening mammogram for malignant neoplasm of breast: Secondary | ICD-10-CM

## 2023-05-08 ENCOUNTER — Ambulatory Visit
Admission: RE | Admit: 2023-05-08 | Discharge: 2023-05-08 | Disposition: A | Discharge status: Home / Self Care | Payer: BC Managed Care – PPO | Source: Ambulatory Visit | Attending: Nurse Practitioner | Admitting: Nurse Practitioner

## 2023-05-08 DIAGNOSIS — Z1231 Encounter for screening mammogram for malignant neoplasm of breast: Secondary | ICD-10-CM | POA: Diagnosis present

## 2023-05-10 NOTE — Progress Notes (Signed)
Contacted via MyChart   Normal mammogram, may repeat in one year:)

## 2023-05-19 ENCOUNTER — Encounter: Payer: BC Managed Care – PPO | Admitting: Nurse Practitioner

## 2023-05-19 NOTE — Patient Instructions (Signed)
 Be Involved in Caring For Your Health:  Taking Medications When medications are taken as directed, they can greatly improve your health. But if they are not taken as prescribed, they may not work. In some cases, not taking them correctly can be harmful. To help ensure your treatment remains effective and safe, understand your medications and how to take them. Bring your medications to each visit for review by your provider.  Your lab results, notes, and after visit summary will be available on My Chart. We strongly encourage you to use this feature. If lab results are abnormal the clinic will contact you with the appropriate steps. If the clinic does not contact you assume the results are satisfactory. You can always view your results on My Chart. If you have questions regarding your health or results, please contact the clinic during office hours. You can also ask questions on My Chart.  We at [facility] are grateful that you chose [place] to provide your care. We strive to provide evidence-based and compassionate care and are always looking for feedback. If you get a survey from the clinic please complete this so we can hear your opinions.  Diabetes Mellitus and Skin Care Diabetes, also called diabetes mellitus, can lead to skin problems. If blood sugar (glucose) is not well controlled, it can cause problems over time. These problems include: Damage to nerves. This can affect your ability to feel wounds. This means you may not notice small skin injuries that could lead to bigger problems. This can also decrease the amount that you sweat, causing dry skin. Damage to blood vessels. The lack of blood flow can cause skin to break down. It can also slow healing time, which can lead to infections. Areas of skin that become thick or discolored. Common skin conditions There are certain skin conditions that often affect people with diabetes. These include: Dry skin. Thin skin. The skin on the feet  may get thinner, break more easily, and heal more slowly than normal. Skin infections from bacteria. These include: Styes. These are infections near the eyelid. Boils. These are bumps filled with pus. Infected hair follicles. Infections of the skin around the nails. Fungal skin infections. These are most common in areas where skin rubs together, such as in the armpits or under the breasts. Common skin changes Diabetes can also cause the skin to change. You may develop: Dark, velvety markings on your skin. These may appear on your face, neck, armpits, inner thighs, and groin. Red, raised, scar-like tissue that may itch, feel painful, or become a wound. Blisters on your feet, toes, hands, or fingers. Thick, wax-like areas of skin. In most cases, these occur on the hands, forehead, or toes. Brown or red, ring-shaped or half-ring-shaped patches of skin on the ears or fingers. Pea-shaped, yellow bumps that may be itchy and have a red ring around them. This may affect your arms, feet, buttocks, and the top of your hands. Round, discolored patches of tan skin that do not hurt or itch. These may look like age spots. Supplies needed: Mild soap or gentle skin cleanser. Lotion. How to care for dry, itchy skin Frequent high glucose levels can cause skin to become itchy. Poor blood circulation and skin infections can make dry skin worse. If you have dry, itchy skin: Avoid very hot showers and baths. Use mild soap and gentle skin cleansers. Do not use soap that is perfumed, harsh, or that dries your skin. Moisturizing soaps may help. Put on moisturizing  lotion as soon as you finish bathing. Do not scratch dry skin. Scratching can expose skin to infection. If you have a rash or if your skin is very itchy, contact your health care provider. Skin that is red or covered in a rash may be a sign of an allergic reaction. Very itchy skin may mean that you need help to manage your diabetes better. You may also  need treatment for an infection. General tips Most skin problems can be prevented or treated easily if caught early. Talk with your health care provider if you have any concerns. General tips include: Check your skin every day for cuts, bruises, redness, blisters, or sores, especially on your feet. If you cannot see the bottom of your feet, use a mirror or ask someone for help. Tell your health care provider if you have any of these injuries and if they are healing slowly. Keep your skin clean and dry. Do not use hot water. Moisturize your skin to prevent chapping. Keep your blood glucose levels within target range. Follow these instructions at home:  Take over-the-counter and prescription medicines only as told by your health care provider. This includes all diabetes medicines you are taking. Schedule a foot exam with your health care provider once a year. During the exam, the structure and skin of your feet will be checked for problems. Make sure that your health care provider does a visual foot exam at every visit. If you get a skin injury, such as a cut, blister, or sore, check the area every day for signs of infection. Check for: Redness, swelling, or pain. Fluid or blood. Warmth. Pus or a bad smell. Do not use any products that contain nicotine or tobacco. These products include cigarettes, chewing tobacco, and vaping devices, such as e-cigarettes. If you need help quitting, ask your health care provider. Where to find more information American Diabetes Association: diabetes.org Association of Diabetes Care & Education Specialists: diabeteseducator.org Contact a health care provider if: You get a cut or sore, especially on your feet. You have signs of infection after a skin injury. You have itchy skin that turns red or develops a rash. You have discolored areas of skin. You have places on your skin that change. They may thicken or appear shiny. This information is not intended to  replace advice given to you by your health care provider. Make sure you discuss any questions you have with your health care provider. Document Revised: 01/26/2022 Document Reviewed: 01/26/2022 Elsevier Patient Education  2024 [employer].

## 2023-05-22 ENCOUNTER — Encounter: Payer: Self-pay | Admitting: Nurse Practitioner

## 2023-05-22 ENCOUNTER — Ambulatory Visit: Payer: BC Managed Care – PPO | Admitting: Nurse Practitioner

## 2023-05-22 VITALS — BP 124/75 | HR 77 | Temp 98.0°F | Ht 66.5 in | Wt 193.6 lb

## 2023-05-22 DIAGNOSIS — Z23 Encounter for immunization: Secondary | ICD-10-CM | POA: Diagnosis not present

## 2023-05-22 DIAGNOSIS — E559 Vitamin D deficiency, unspecified: Secondary | ICD-10-CM

## 2023-05-22 DIAGNOSIS — E1140 Type 2 diabetes mellitus with diabetic neuropathy, unspecified: Secondary | ICD-10-CM | POA: Diagnosis not present

## 2023-05-22 DIAGNOSIS — J432 Centrilobular emphysema: Secondary | ICD-10-CM

## 2023-05-22 DIAGNOSIS — E1159 Type 2 diabetes mellitus with other circulatory complications: Secondary | ICD-10-CM

## 2023-05-22 DIAGNOSIS — Z0000 Encounter for general adult medical examination without abnormal findings: Secondary | ICD-10-CM | POA: Diagnosis not present

## 2023-05-22 DIAGNOSIS — E66811 Obesity, class 1: Secondary | ICD-10-CM

## 2023-05-22 DIAGNOSIS — E6609 Other obesity due to excess calories: Secondary | ICD-10-CM

## 2023-05-22 DIAGNOSIS — E1169 Type 2 diabetes mellitus with other specified complication: Secondary | ICD-10-CM | POA: Diagnosis not present

## 2023-05-22 DIAGNOSIS — F17210 Nicotine dependence, cigarettes, uncomplicated: Secondary | ICD-10-CM

## 2023-05-22 DIAGNOSIS — I359 Nonrheumatic aortic valve disorder, unspecified: Secondary | ICD-10-CM

## 2023-05-22 DIAGNOSIS — I700 Atherosclerosis of aorta: Secondary | ICD-10-CM

## 2023-05-22 DIAGNOSIS — I152 Hypertension secondary to endocrine disorders: Secondary | ICD-10-CM

## 2023-05-22 DIAGNOSIS — F418 Other specified anxiety disorders: Secondary | ICD-10-CM

## 2023-05-22 MED ORDER — ONETOUCH VERIO VI STRP: ORAL_STRIP | 12 refills | Status: DC

## 2023-05-22 MED ORDER — HYDROXYZINE PAMOATE 25 MG PO CAPS: 25.0000 mg | ORAL_CAPSULE | Freq: Three times a day (TID) | ORAL | 4 refills | Status: DC | PRN

## 2023-05-22 MED ORDER — SEMAGLUTIDE (2 MG/DOSE) 8 MG/3ML ~~LOC~~ SOPN: 2.0000 mg | PEN_INJECTOR | SUBCUTANEOUS | 4 refills | Status: DC

## 2023-05-22 MED ORDER — ALBUTEROL SULFATE HFA 108 (90 BASE) MCG/ACT IN AERS: 2.0000 | INHALATION_SPRAY | Freq: Four times a day (QID) | RESPIRATORY_TRACT | 4 refills | Status: AC | PRN

## 2023-05-22 MED ORDER — ONETOUCH VERIO W/DEVICE KIT: PACK | 0 refills | Status: AC

## 2023-05-22 MED ORDER — LISINOPRIL 10 MG PO TABS: 10.0000 mg | ORAL_TABLET | Freq: Every day | ORAL | 4 refills | Status: DC

## 2023-05-22 MED ORDER — SIMVASTATIN 40 MG PO TABS: 40.0000 mg | ORAL_TABLET | Freq: Every day | ORAL | 4 refills | Status: DC

## 2023-05-22 MED ORDER — GLUCOSE BLOOD VI STRP: ORAL_STRIP | 12 refills | Status: DC

## 2023-05-22 NOTE — Assessment & Plan Note (Signed)
Chronic, noted on CT lung imaging.  Recommend continue statin therapy and recommend complete cessation of smoking.

## 2023-05-22 NOTE — Assessment & Plan Note (Signed)
Due to mother with dementia and work, stable at this time.  Continue Vistaril as needed.  Educated her on these and side effects + use.   Alert provider if any issues.  Denies SI/HI.

## 2023-05-22 NOTE — Assessment & Plan Note (Signed)
BMI 30.78 with some loss with Ozempic.  Recommended eating smaller high protein, low fat meals more frequently and exercising 30 mins a day 5 times a week with a goal of 10-15lb weight loss in the next 3 months. Patient voiced their understanding and motivation to adhere to these recommendations.

## 2023-05-22 NOTE — Progress Notes (Signed)
BP 124/75   Pulse 77   Temp 98 F (36.7 C) (Oral)   Ht 5' 6.5" (1.689 m)   Wt 193 lb 9.6 oz (87.8 kg)   LMP 11/06/2017 (Approximate)   SpO2 96%   BMI 30.78 kg/m    Subjective:    Patient ID: Holly Morales, female    DOB: 08/06/1971, 52 y.o.   MRN: [id number]  HPI: Holly Morales is a 52 y.o. female presenting on 05/22/2023 for comprehensive medical examination. Current medical complaints include:none  She currently lives with: significant other Menopausal Symptoms: no  History of abnormal pap on 01/07/2019 -- went to [facility] in the fall of  2022 and reports this was normal and overall exam was good -- no records in chart.  Have requested in past.   DIABETES A1c April was 6.3%. Continues on Ozempic 1 MG weekly.  Previously took [place], but insurance stopped covering.  Has missed 2 weeks of doses recently, occasionally misses doses.  She feels her A1c may be elevated. Hypoglycemic episodes:no Polydipsia/polyuria: no Visual disturbance: no Chest pain: no Paresthesias: no Glucose Monitoring: yes             Accucheck frequency: none             Fasting glucose:              Post prandial:             Evening:             Before meals: Taking Insulin?: no             Long acting insulin:             Short acting insulin: Blood Pressure Monitoring: not checking Retinal Examination: Up To Date -- [facility] Foot Exam: Up to Date Pneumovax: Up to Date Influenza: Up to Date Aspirin: yes   HYPERTENSION / HYPERLIPIDEMIA Continues on Simvastatin and Lisinopril. Does not miss doses of these. Satisfied with current treatment? yes Duration of hypertension: chronic BP monitoring frequency: not checking BP range:  BP medication side effects: no Duration of hyperlipidemia: chronic Cholesterol medication side effects: no Cholesterol supplements: none Medication compliance: good compliance Aspirin: no Recent stressors: no Recurrent headaches: no Visual changes:  no Palpitations: no Dyspnea: no Chest pain: no Lower extremity edema: no Dizzy/lightheaded: no  COPD Continues to smoke at this time 1/2 to 1 PPD -- started back to smoking 2011, has been smoking for about 30 years.  Was 11 when started.  Had lung CA cancer screening last on 01/19/23 - mild centrilobular emphysema and aortic atherosclerosis -- no changes from previous.  Does endorse occasional SOB with heavy activity. COPD status: stable Satisfied with current treatment?: yes Oxygen use: no Dyspnea frequency: occasional with heavy activity Cough frequency: occasional Rescue inhaler frequency:  none Limitation of activity: no Productive cough: none Last Spirometry: unknown Pneumovax: Up to Date Influenza: Up to Date   ANXIETY/STRESS Her mother has dementia and is caregiver to her, overall is doing okay at this time.  Taking Vistaril 25 MG BID PRN.  In past took Celexa, but stopped as worried about side effects.  Work is a major stressor for her.   Duration: stable Anxious mood: occasional Excessive worrying: occasional Irritability: yes, occasional Sweating: no Nausea: no Palpitations:no Hyperventilation: no Panic attacks: no Agoraphobia: no  Obscessions/compulsions: no Depressed mood: no    05/22/2023    8:48 AM 11/17/2022    8:19 AM 08/16/2022  8:16 AM 05/16/2022    8:19 AM 12/17/2021    4:23 PM  Depression screen PHQ 2/9  Decreased Interest 0 0 0 1 0  Down, Depressed, Hopeless 0 0 1 0 0  PHQ - 2 Score 0 0 1 1 0  Altered sleeping 0 0 1 2 0  Tired, decreased energy 2 0 1 2 1   Change in appetite 0 0 0 0 0  Feeling bad or failure about yourself  0 0 0 0 0  Trouble concentrating 0 1 0 0 0  Moving slowly or fidgety/restless 0 0 0 0 0  Suicidal thoughts 0 0 0 0 0  PHQ-9 Score 2 1 3 5 1   Difficult doing work/chores Very difficult Not difficult at all Not difficult at all Not difficult at all Somewhat difficult       05/22/2023    8:48 AM 11/17/2022    8:20 AM 08/16/2022     8:17 AM 05/16/2022    8:19 AM  GAD 7 : Generalized Anxiety Score  Nervous, Anxious, on Edge 2 2 2 1   Control/stop worrying 2 1 1  0  Worry too much - different things 2 1 1  0  Trouble relaxing 1 1 0 0  Restless 1 0 1 0  Easily annoyed or irritable 0 1 0 0  Afraid - awful might happen 2 1 2 2   Total GAD 7 Score 10 7 7 3   Anxiety Difficulty Very difficult Somewhat difficult Not difficult at all Not difficult at all   Functional Status Survey: Is the patient deaf or have difficulty hearing?: No Does the patient have difficulty seeing, even when wearing glasses/contacts?: No Does the patient have difficulty concentrating, remembering, or making decisions?: No Does the patient have difficulty walking or climbing stairs?: No Does the patient have difficulty dressing or bathing?: No Does the patient have difficulty doing errands alone such as visiting a doctor's office or shopping?: No      12/17/2021    4:23 PM 05/16/2022    8:20 AM 08/16/2022    8:16 AM 11/17/2022    8:19 AM 05/22/2023    8:48 AM  Fall Risk  Falls in the past year? 0 0 0 0 0  Was there an injury with Fall? 0 0 0 0 0  Fall Risk Category Calculator 0 0 0 0 0  Fall Risk Category (Retired) Low Low Low    (RETIRED) Patient Fall Risk Level Low fall risk Low fall risk     Patient at Risk for Falls Due to No Fall Risks No Fall Risks No Fall Risks No Fall Risks No Fall Risks  Fall risk Follow up Falls evaluation completed Falls prevention discussed Falls evaluation completed Falls evaluation completed Falls evaluation completed    Past Medical History:  Past Medical History:  Diagnosis Date   Depression    06/25/18 no longer an issue per pt    Diabetes mellitus without complication (HCC)    High cholesterol    Obesity (BMI 30-39.9)     Surgical History:  Past Surgical History:  Procedure Laterality Date   CARPAL TUNNEL RELEASE Right 07/07/2020   COLONOSCOPY WITH PROPOFOL N/A 04/15/2021   Procedure: COLONOSCOPY WITH  PROPOFOL;  Surgeon: Wyline Mood, MD;  Location: [facility] ENDOSCOPY;  Service: Gastroenterology;  Laterality: N/A;   LEEP  2009   NO PAST SURGERIES      Medications:  Current Outpatient Medications on File Prior to Visit  Medication Sig   aspirin EC 81 MG  tablet Take 81 mg by mouth daily. Swallow whole.   B-D ULTRAFINE III SHORT PEN 31G X 8 MM MISC USE ONE NEEDLE DAILY TO INJECT SAXENDA   ibuprofen (ADVIL) 800 MG tablet TAKE 1 TABLET BY MOUTH EVERY 8 HOURS AS NEEDED   nicotine (NICODERM CQ - DOSED IN MG/24 HR) 7 mg/24hr patch Place 1 patch (7 mg total) onto the skin daily.   ONETOUCH DELICA LANCETS 33G MISC USE TO TEST BLOOD SUGAR DAILY   No current facility-administered medications on file prior to visit.    Allergies:  Allergies  Allergen Reactions   Jardiance [Empagliflozin]     Candidiasis    Social History:  Social History   Socioeconomic History   Marital status: Media planner    Spouse name: Not on file   Number of children: 0   Years of education: Not on file   Highest education level: High school graduate  Occupational History   Not on file  Tobacco Use   Smoking status: Every Day    Current packs/day: 0.00    Average packs/day: 0.8 packs/day for 30.0 years (22.5 ttl pk-yrs)    Types: Cigarettes    Start date: 07/05/1989    Last attempt to quit: 07/06/2019    Years since quitting: 3.8   Smokeless tobacco: Never  Vaping Use   Vaping status: Never Used  Substance and Sexual Activity   Alcohol use: Yes    Alcohol/week: 1.0 standard drink of alcohol    Types: 1 Standard drinks or equivalent per week    Comment: on occasion   Drug use: No    Comment: 06/25/18--30 yrs ago smoked marijuana   Sexual activity: Yes  Other Topics Concern   Not on file  Social History Narrative   Lives at home alone   Right handed   Caffeine: 1 pot of coffee every morning, 1 cup of unsweet tea daily, lots of water, very seldom soda.   Social Determinants of Health   Financial  Resource Strain: Low Risk  (05/22/2023)   Overall Financial Resource Strain (CARDIA)    Difficulty of Paying Living Expenses: Not hard at all  Food Insecurity: No Food Insecurity (05/22/2023)   Hunger Vital Sign    Worried About Running Out of Food in the Last Year: Never true    Ran Out of Food in the Last Year: Never true  Transportation Needs: No Transportation Needs (05/22/2023)   PRAPARE - Administrator, Civil Service (Medical): No    Lack of Transportation (Non-Medical): No  Physical Activity: Inactive (05/22/2023)   Exercise Vital Sign    Days of Exercise per Week: 0 days    Minutes of Exercise per Session: 0 min  Stress: Stress Concern Present (05/22/2023)   [employer] of Occupational Health - Occupational Stress Questionnaire    Feeling of Stress : To some extent  Social Connections: Socially Integrated (05/22/2023)   Social Connection and Isolation Panel [NHANES]    Frequency of Communication with Friends and Family: More than three times a week    Frequency of Social Gatherings with Friends and Family: Once a week    Attends Religious Services: More than 4 times per year    Active Member of [employer] or Organizations: Yes    Attends Banker Meetings: More than 4 times per year    Marital Status: Living with partner  Intimate Partner Violence: Not At Risk (05/22/2023)   Humiliation, Afraid, Rape, and Kick questionnaire  Fear of Current or Ex-Partner: No    Emotionally Abused: No    Physically Abused: No    Sexually Abused: No   Social History   Tobacco Use  Smoking Status Every Day   Current packs/day: 0.00   Average packs/day: 0.8 packs/day for 30.0 years (22.5 ttl pk-yrs)   Types: Cigarettes   Start date: 07/05/1989   Last attempt to quit: 07/06/2019   Years since quitting: 3.8  Smokeless Tobacco Never   Social History   Substance and Sexual Activity  Alcohol Use Yes   Alcohol/week: 1.0 standard drink of alcohol   Types: 1  Standard drinks or equivalent per week   Comment: on occasion    Family History:  Family History  Problem Relation Age of Onset   Dementia Mother    Diabetes Father    Cancer Father        Prostate   Irregular heart beat Father    Breast cancer Neg Hx     Past medical history, surgical history, medications, allergies, family history and social history reviewed with patient today and changes made to appropriate areas of the chart.   ROS All other ROS negative except what is listed above and in the HPI.      Objective:    BP 124/75   Pulse 77   Temp 98 F (36.7 C) (Oral)   Ht 5' 6.5" (1.689 m)   Wt 193 lb 9.6 oz (87.8 kg)   LMP 11/06/2017 (Approximate)   SpO2 96%   BMI 30.78 kg/m   Wt Readings from Last 3 Encounters:  05/22/23 193 lb 9.6 oz (87.8 kg)  11/17/22 194 lb 4.8 oz (88.1 kg)  08/16/22 200 lb 12.8 oz (91.1 kg)    Physical Exam Vitals and nursing note reviewed.  Constitutional:      General: She is awake. She is not in acute distress.    Appearance: She is well-developed and well-groomed. She is obese. She is not ill-appearing or toxic-appearing.  HENT:     Head: Normocephalic and atraumatic.     Right Ear: Hearing, tympanic membrane, ear canal and external ear normal. No drainage.     Left Ear: Hearing, tympanic membrane, ear canal and external ear normal. No drainage.     Nose: Nose normal.     Right Sinus: No maxillary sinus tenderness or frontal sinus tenderness.     Left Sinus: No maxillary sinus tenderness or frontal sinus tenderness.     Mouth/Throat:     Mouth: Mucous membranes are moist.     Pharynx: Oropharynx is clear. Uvula midline. No pharyngeal swelling, oropharyngeal exudate or posterior oropharyngeal erythema.  Eyes:     General: Lids are normal.        Right eye: No discharge.        Left eye: No discharge.     Extraocular Movements: Extraocular movements intact.     Conjunctiva/sclera: Conjunctivae normal.     Pupils: Pupils are equal,  round, and reactive to light.     Visual Fields: Right eye visual fields normal and left eye visual fields normal.  Neck:     Thyroid: No thyromegaly.     Vascular: No carotid bruit.     Trachea: Trachea normal.  Cardiovascular:     Rate and Rhythm: Normal rate and regular rhythm.     Heart sounds: Normal heart sounds. No murmur heard.    No gallop.  Pulmonary:     Effort: Pulmonary effort is normal. No  accessory muscle usage or respiratory distress.     Breath sounds: Normal breath sounds.  Abdominal:     General: Bowel sounds are normal.     Palpations: Abdomen is soft. There is no hepatomegaly or splenomegaly.     Tenderness: There is no abdominal tenderness.  Musculoskeletal:        General: Normal range of motion.     Cervical back: Normal range of motion and neck supple.     Right lower leg: No edema.     Left lower leg: No edema.  Lymphadenopathy:     Head:     Right side of head: No submental, submandibular, tonsillar, preauricular or posterior auricular adenopathy.     Left side of head: No submental, submandibular, tonsillar, preauricular or posterior auricular adenopathy.     Cervical: No cervical adenopathy.  Skin:    General: Skin is warm and dry.     Capillary Refill: Capillary refill takes less than 2 seconds.     Findings: No rash.  Neurological:     Mental Status: She is alert and oriented to person, place, and time.     Gait: Gait is intact.     Deep Tendon Reflexes: Reflexes are normal and symmetric.     Reflex Scores:      Brachioradialis reflexes are 2+ on the right side and 2+ on the left side.      Patellar reflexes are 2+ on the right side and 2+ on the left side. Psychiatric:        Attention and Perception: Attention normal.        Mood and Affect: Mood normal.        Speech: Speech normal.        Behavior: Behavior normal. Behavior is cooperative.        Thought Content: Thought content normal.        Judgment: Judgment normal.    Diabetic  Foot Exam - Simple   Simple Foot Form Visual Inspection No deformities, no ulcerations, no other skin breakdown bilaterally: Yes Sensation Testing Intact to touch and monofilament testing bilaterally: Yes Pulse Check Posterior Tibialis and Dorsalis pulse intact bilaterally: Yes Comments     Results for orders placed or performed in visit on 11/17/22  Basic metabolic panel  Result Value Ref Range   Glucose 112 (H) 70 - 99 mg/dL   BUN 12 6 - 24 mg/dL   Creatinine, Ser 1.19 0.57 - 1.00 mg/dL   eGFR 147 >82 NF/AOZ/3.08   BUN/Creatinine Ratio 19 9 - 23   Sodium 142 134 - 144 mmol/L   Potassium 4.4 3.5 - 5.2 mmol/L   Chloride 102 96 - 106 mmol/L   CO2 23 20 - 29 mmol/L   Calcium 9.4 8.7 - 10.2 mg/dL  Bayer DCA Hb M5H Waived  Result Value Ref Range   HB A1C (BAYER DCA - WAIVED) 6.3 (H) 4.8 - 5.6 %  Lipid Panel w/o Chol/HDL Ratio  Result Value Ref Range   Cholesterol, Total 120 100 - 199 mg/dL   Triglycerides 64 0 - 149 mg/dL   HDL 57 >84 mg/dL   VLDL Cholesterol Cal 14 5 - 40 mg/dL   LDL Chol Calc (NIH) 49 0 - 99 mg/dL  Vitamin O96  Result Value Ref Range   Vitamin B-12 >2000 (H) 232 - 1245 pg/mL      Assessment & Plan:   Problem List Items Addressed This Visit       Cardiovascular and Mediastinum  Aortic atherosclerosis (HCC)    Chronic, noted on CT lung imaging.  Recommend continue statin therapy and recommend complete cessation of smoking.      Relevant Medications   lisinopril (ZESTRIL) 10 MG tablet   simvastatin (ZOCOR) 40 MG tablet   Hypertension associated with diabetes (HCC)    Chronic, stable.  BP at goal in office today.  Continue Lisinopril, which offers kidney protection.  Urine micro ALB 06 September 2022.  Recommend she check BP at home at least 3 mornings a week and focus on DASH diet.  LABS: CMP, CBC, TSH.  Consider change from ACE to ARB in future due to underlying COPD.  Return in 3 months.      Relevant Medications   Semaglutide, 2 MG/DOSE, 8 MG/3ML  SOPN   lisinopril (ZESTRIL) 10 MG tablet   simvastatin (ZOCOR) 40 MG tablet   Other Relevant Orders   CBC with Differential/Platelet   Comprehensive metabolic panel   TSH   HgB A1c     Respiratory   Centrilobular emphysema (HCC)    Chronic, stable.  No current inhalers, consider these in future.  Spirometry next visit.  Recommend complete cessation of smoking.  Continue annual lung cancer screening.  Will send in Albuterol to use as needed before activity.  May need daily inhaler in future.      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  Urine ALB 06 September 2022.  Will increase Ozempic to 2 MG as is tolerating and recommend no missing doses  Recommend she continue to monitor BS at home with goal <130 in morning and <180 two hours after a meal - if elevations she is to notify provider and we may restart Metformin.  Return in 6 months for A1c check. - Eye and foot exams up to date - Vaccinations up to date - Statin and ACE on board      Relevant Medications   Semaglutide, 2 MG/DOSE, 8 MG/3ML SOPN   lisinopril (ZESTRIL) 10 MG tablet   simvastatin (ZOCOR) 40 MG tablet   Other Relevant Orders   HgB A1c   Hyperlipidemia associated with type 2 diabetes mellitus (HCC)    Chronic, stable.  Continue current medication regimen and adjust as needed.  Lipid panel today.      Relevant Medications   Semaglutide, 2 MG/DOSE, 8 MG/3ML SOPN   lisinopril (ZESTRIL) 10 MG tablet   simvastatin (ZOCOR) 40 MG tablet   Other Relevant Orders   Comprehensive metabolic panel   Lipid Panel w/o Chol/HDL Ratio   HgB A1c     Other   Nicotine dependence, cigarettes, uncomplicated    I have recommended complete cessation of tobacco use. I have discussed various options  available for assistance with tobacco cessation including over the counter methods (Nicotine gum, patch and lozenges). We also discussed prescription options (Chantix, Nicotine Inhaler / Nasal Spray). The patient is not interested in pursuing any prescription tobacco cessation options at this time. Continue annual lung screening.      Obesity    BMI 30.78 with some loss with Ozempic.  Recommended eating smaller high protein, low fat meals more frequently and exercising 30 mins a day 5 times a week with a goal of 10-15lb weight loss in  the next 3 months. Patient voiced their understanding and motivation to adhere to these recommendations.       Relevant Medications   Semaglutide, 2 MG/DOSE, 8 MG/3ML SOPN   Situational anxiety    Due to mother with dementia and work, stable at this time.  Continue Vistaril as needed.  Educated her on these and side effects + use.   Alert provider if any issues.  Denies SI/HI.        Relevant Medications   hydrOXYzine (VISTARIL) 25 MG capsule   Vitamin D deficiency    Noted on past labs, occasionally takes supplement. Recheck today.      Relevant Orders   VITAMIN D 25 Hydroxy (Vit-D Deficiency, Fractures)   Other Visit Diagnoses     Flu vaccine need       Flu vaccine provided today, educated patient.   Relevant Orders   Flu vaccine trivalent PF, 6mos and older(Flulaval,Afluria,Fluarix,Fluzone) (Completed)   Encounter for annual physical exam       Annual physical today with labs and health maintenance reviewed, discussed with patient.        Follow up plan: Return in about 6 months (around 11/20/2023) for T2DM, HTN/HLD, MOOD.   LABORATORY TESTING:  - Pap smear: up to date  IMMUNIZATIONS:   - Tdap: Tetanus vaccination status reviewed: last tetanus booster within 10 years. - Influenza: Administered today - Pneumovax: Up to date - Prevnar: Not applicable - COVID: Up to date - HPV: Not applicable - Shingrix vaccine: will schedule - Lung  Screening: Up To Date  SCREENING: -Mammogram: Up to date  - Colonoscopy: Up to date  - Bone Density: Not applicable  -Hearing Test: Not applicable  -Spirometry: Not applicable   PATIENT COUNSELING:   Advised to take 1 mg of folate supplement per day if capable of pregnancy.   Sexuality: Discussed sexually transmitted diseases, partner selection, use of condoms, avoidance of unintended pregnancy  and contraceptive alternatives.   Advised to avoid cigarette smoking.  I discussed with the patient that most people either abstain from alcohol or drink within safe limits (<=14/week and <=4 drinks/occasion for males, <=7/weeks and <= 3 drinks/occasion for females) and that the risk for alcohol disorders and other health effects rises proportionally with the number of drinks per week and how often a drinker exceeds daily limits.  Discussed cessation/primary prevention of drug use and availability of treatment for abuse.   Diet: Encouraged to adjust caloric intake to maintain  or achieve ideal body weight, to reduce intake of dietary saturated fat and total fat, to limit sodium intake by avoiding high sodium foods and not adding table salt, and to maintain adequate dietary potassium and calcium preferably from fresh fruits, vegetables, and low-fat dairy products.    Stressed the importance of regular exercise  Injury prevention: Discussed safety belts, safety helmets, smoke detector, smoking near bedding or upholstery.   Dental health: Discussed importance of regular tooth brushing, flossing, and dental visits.    NEXT PREVENTATIVE PHYSICAL DUE IN 1 YEAR. Return in about 6 months (around 11/20/2023) for T2DM, HTN/HLD, MOOD.

## 2023-05-22 NOTE — Assessment & Plan Note (Signed)
Chronic, stable.  BP at goal in office today.  Continue Lisinopril, which offers kidney protection.  Urine micro ALB 06 September 2022.  Recommend she check BP at home at least 3 mornings a week and focus on DASH diet.  LABS: CMP, CBC, TSH.  Consider change from ACE to ARB in future due to underlying COPD.  Return in 3 months.

## 2023-05-22 NOTE — Assessment & Plan Note (Signed)
Noted on past labs, occasionally takes supplement. Recheck today.

## 2023-05-22 NOTE — Assessment & Plan Note (Signed)
I have recommended complete cessation of tobacco use. I have discussed various options available for assistance with tobacco cessation including over the counter methods (Nicotine gum, patch and lozenges). We also discussed prescription options (Chantix, Nicotine Inhaler / Nasal Spray). The patient is not interested in pursuing any prescription tobacco cessation options at this time.  Continue annual lung screening.

## 2023-05-22 NOTE — Assessment & Plan Note (Signed)
Chronic, stable.  No current inhalers, consider these in future.  Spirometry next visit.  Recommend complete cessation of smoking.  Continue annual lung cancer screening.  Will send in Albuterol to use as needed before activity.  May need daily inhaler in future.

## 2023-05-22 NOTE — Assessment & Plan Note (Signed)
Chronic, stable with current A1c 6.3% last visit, recheck today.  Urine ALB 06 September 2022.  Will increase Ozempic to 2 MG as is tolerating and recommend no missing doses  Recommend she continue to monitor BS at home with goal <130 in morning and <180 two hours after a meal - if elevations she is to notify provider and we may restart Metformin.  Return in 6 months for A1c check. - Eye and foot exams up to date - Vaccinations up to date - Statin and ACE on board

## 2023-05-22 NOTE — Assessment & Plan Note (Signed)
Chronic, stable.  Continue current medication regimen and adjust as needed.  Lipid panel today.

## 2023-05-23 LAB — COMPREHENSIVE METABOLIC PANEL
ALT: 22 [IU]/L (ref 0–32)
AST: 19 [IU]/L (ref 0–40)
Albumin: 4.8 g/dL (ref 3.8–4.9)
Alkaline Phosphatase: 93 [IU]/L (ref 44–121)
BUN/Creatinine Ratio: 10 (ref 9–23)
BUN: 7 mg/dL (ref 6–24)
Bilirubin Total: 0.4 mg/dL (ref 0.0–1.2)
CO2: 23 mmol/L (ref 20–29)
Calcium: 10 mg/dL (ref 8.7–10.2)
Chloride: 101 mmol/L (ref 96–106)
Creatinine, Ser: 0.71 mg/dL (ref 0.57–1.00)
Globulin, Total: 2.5 g/dL (ref 1.5–4.5)
Glucose: 126 mg/dL — ABNORMAL HIGH (ref 70–99)
Potassium: 4.4 mmol/L (ref 3.5–5.2)
Sodium: 140 mmol/L (ref 134–144)
Total Protein: 7.3 g/dL (ref 6.0–8.5)
eGFR: 102 mL/min/{1.73_m2} (ref >59)

## 2023-05-23 LAB — CBC WITH DIFFERENTIAL/PLATELET
Basophils Absolute: 0.1 10*3/uL (ref 0.0–0.2)
Basos: 1 %
EOS (ABSOLUTE): 0.3 10*3/uL (ref 0.0–0.4)
Eos: 4 %
Hematocrit: 42.6 % (ref 34.0–46.6)
Hemoglobin: 14.2 g/dL (ref 11.1–15.9)
Immature Grans (Abs): 0 10*3/uL (ref 0.0–0.1)
Immature Granulocytes: 0 %
Lymphocytes Absolute: 1.8 10*3/uL (ref 0.7–3.1)
Lymphs: 24 %
MCH: 32.9 pg (ref 26.6–33.0)
MCHC: 33.3 g/dL (ref 31.5–35.7)
MCV: 99 fL — ABNORMAL HIGH (ref 79–97)
Monocytes Absolute: 0.6 10*3/uL (ref 0.1–0.9)
Monocytes: 8 %
Neutrophils Absolute: 4.7 10*3/uL (ref 1.4–7.0)
Neutrophils: 63 %
Platelets: 266 10*3/uL (ref 150–450)
RBC: 4.31 x10E6/uL (ref 3.77–5.28)
RDW: 12.3 % (ref 11.7–15.4)
WBC: 7.5 10*3/uL (ref 3.4–10.8)

## 2023-05-23 LAB — HEMOGLOBIN A1C
Est. average glucose Bld gHb Est-mCnc: 154 mg/dL
Hgb A1c MFr Bld: 7 % — ABNORMAL HIGH (ref 4.8–5.6)

## 2023-05-23 LAB — TSH: TSH: 2.22 u[IU]/mL (ref 0.450–4.500)

## 2023-05-23 LAB — LIPID PANEL W/O CHOL/HDL RATIO
Cholesterol, Total: 142 mg/dL (ref 100–199)
HDL: 57 mg/dL (ref >39)
LDL Chol Calc (NIH): 63 mg/dL (ref 0–99)
Triglycerides: 127 mg/dL (ref 0–149)
VLDL Cholesterol Cal: 22 mg/dL (ref 5–40)

## 2023-05-23 LAB — VITAMIN D 25 HYDROXY (VIT D DEFICIENCY, FRACTURES): Vit D, 25-Hydroxy: 45.4 ng/mL (ref 30.0–100.0)

## 2023-05-23 NOTE — Progress Notes (Signed)
Contacted via MyChart   Good afternoon Holly Morales, your labs have returned: - A1c did creep up some from 6.3% to 7%.  Definitely start the Ozempic at 2 MG like we discussed and focus heavily on diet changes and exercise. - Kidney function, creatinine and eGFR, remains normal, as is liver function, AST and ALT.  - Remainder of labs look great.  Any questions? Keep being amazing!!  Thank you for allowing me to participate in your care.  I appreciate you. Kindest regards, Nezzie Manera

## 2023-05-25 ENCOUNTER — Encounter: Payer: Self-pay | Admitting: Nurse Practitioner

## 2023-06-09 ENCOUNTER — Ambulatory Visit (INDEPENDENT_AMBULATORY_CARE_PROVIDER_SITE_OTHER): Payer: BC Managed Care – PPO

## 2023-06-09 ENCOUNTER — Telehealth: Payer: Self-pay

## 2023-06-09 DIAGNOSIS — Z23 Encounter for immunization: Secondary | ICD-10-CM

## 2023-06-09 MED ORDER — ONETOUCH DELICA LANCETS 33G MISC: 12 refills | Status: AC

## 2023-06-09 MED ORDER — ONETOUCH VERIO VI STRP: ORAL_STRIP | 12 refills | Status: DC

## 2023-06-09 NOTE — Telephone Encounter (Signed)
Patient came in today for nurse visit, she states that she got her new glucose testing device but didn't receive any testing scripts or lancets please advise?

## 2023-06-12 ENCOUNTER — Other Ambulatory Visit: Payer: Self-pay

## 2023-06-12 MED ORDER — ONETOUCH VERIO VI STRP: 1.0000 | ORAL_STRIP | Freq: Every day | 12 refills | Status: AC

## 2023-07-09 ENCOUNTER — Other Ambulatory Visit: Payer: Self-pay | Admitting: Nurse Practitioner

## 2023-07-12 NOTE — Telephone Encounter (Signed)
Requested Prescriptions  Pending Prescriptions Disp Refills   ibuprofen (ADVIL) 800 MG tablet [Pharmacy Med Name: IBUPROFEN 800 MG TABLET] 90 tablet 1    Sig: TAKE 1 TABLET BY MOUTH EVERY 8 HOURS AS NEEDED     Analgesics:  NSAIDS Failed - 07/09/2023  8:57 AM      Failed - Manual Review: Labs are only required if the patient has taken medication for more than 8 weeks.      Passed - Cr in normal range and within 360 days    Creatinine, Ser  Date Value Ref Range Status  05/22/2023 0.71 0.57 - 1.00 mg/dL Final         Passed - HGB in normal range and within 360 days    Hemoglobin  Date Value Ref Range Status  05/22/2023 14.2 11.1 - 15.9 g/dL Final         Passed - PLT in normal range and within 360 days    Platelets  Date Value Ref Range Status  05/22/2023 266 150 - 450 x10E3/uL Final         Passed - HCT in normal range and within 360 days    Hematocrit  Date Value Ref Range Status  05/22/2023 42.6 34.0 - 46.6 % Final         Passed - eGFR is 30 or above and within 360 days    GFR calc Af Amer  Date Value Ref Range Status  07/17/2020 124 >59 mL/min/1.73 Final    Comment:    **In accordance with recommendations from the NKF-ASN Task force,**   [facility] is in the process of updating its eGFR calculation to the   2021 CKD-EPI creatinine equation that estimates kidney function   without a race variable.    GFR calc non Af Amer  Date Value Ref Range Status  07/17/2020 108 >59 mL/min/1.73 Final   eGFR  Date Value Ref Range Status  05/22/2023 102 >59 mL/min/1.73 Final         Passed - Patient is not pregnant      Passed - Valid encounter within last 12 months    Recent Outpatient Visits           1 month ago Controlled type 2 diabetes with neuropathy (HCC)   [place] [facility] [place], Dorie Rank, NP   4 months ago COVID-19   [place] [facility] Mecum, Erin E, PA-C   7 months ago Controlled type 2 diabetes with neuropathy (HCC)    [place] [facility] [place], Jolene T, NP   11 months ago Controlled type 2 diabetes with neuropathy (HCC)   [place] [facility] [place], Corrie Dandy T, NP   1 year ago Controlled type 2 diabetes with neuropathy (HCC)   [place] [facility] [place], Dorie Rank, NP       Future Appointments             In 4 months Cannady, Dorie Rank, NP [place] [facility], [facility]

## 2023-09-08 ENCOUNTER — Ambulatory Visit (INDEPENDENT_AMBULATORY_CARE_PROVIDER_SITE_OTHER): Payer: 59

## 2023-09-08 DIAGNOSIS — Z23 Encounter for immunization: Secondary | ICD-10-CM

## 2023-09-09 ENCOUNTER — Other Ambulatory Visit: Payer: Self-pay | Admitting: Nurse Practitioner

## 2023-09-11 NOTE — Telephone Encounter (Signed)
Requested Prescriptions  Pending Prescriptions Disp Refills   ibuprofen (ADVIL) 800 MG tablet [Pharmacy Med Name: IBUPROFEN 800 MG TABLET] 90 tablet 0    Sig: TAKE 1 TABLET BY MOUTH EVERY 8 HOURS AS NEEDED     Analgesics:  NSAIDS Failed - 09/11/2023  2:33 PM      Failed - Manual Review: Labs are only required if the patient has taken medication for more than 8 weeks.      Passed - Cr in normal range and within 360 days    Creatinine, Ser  Date Value Ref Range Status  05/22/2023 0.71 0.57 - 1.00 mg/dL Final         Passed - HGB in normal range and within 360 days    Hemoglobin  Date Value Ref Range Status  05/22/2023 14.2 11.1 - 15.9 g/dL Final         Passed - PLT in normal range and within 360 days    Platelets  Date Value Ref Range Status  05/22/2023 266 150 - 450 x10E3/uL Final         Passed - HCT in normal range and within 360 days    Hematocrit  Date Value Ref Range Status  05/22/2023 42.6 34.0 - 46.6 % Final         Passed - eGFR is 30 or above and within 360 days    GFR calc Af Amer  Date Value Ref Range Status  07/17/2020 124 >59 mL/min/1.73 Final    Comment:    **In accordance with recommendations from the NKF-ASN Task force,**   [facility] is in the process of updating its eGFR calculation to the   2021 CKD-EPI creatinine equation that estimates kidney function   without a race variable.    GFR calc non Af Amer  Date Value Ref Range Status  07/17/2020 108 >59 mL/min/1.73 Final   eGFR  Date Value Ref Range Status  05/22/2023 102 >59 mL/min/1.73 Final         Passed - Patient is not pregnant      Passed - Valid encounter within last 12 months    Recent Outpatient Visits           3 months ago Controlled type 2 diabetes with neuropathy (HCC)   [place] [facility] [place], Dorie Rank, NP   6 months ago COVID-19   [place] [facility] Mecum, Erin E, PA-C   9 months ago Controlled type 2 diabetes with neuropathy (HCC)    [place] [facility] [place], Corrie Dandy T, NP   1 year ago Controlled type 2 diabetes with neuropathy (HCC)   [place] [facility] [place], Corrie Dandy T, NP   1 year ago Controlled type 2 diabetes with neuropathy (HCC)   [place] [facility] [place], Dorie Rank, NP       Future Appointments             In 2 months Cannady, Dorie Rank, NP [place] [facility], [facility]

## 2023-09-26 ENCOUNTER — Other Ambulatory Visit: Payer: Self-pay | Admitting: Nurse Practitioner

## 2023-09-26 NOTE — Telephone Encounter (Signed)
 D/C 05/22/23. Requested Prescriptions  Refused Prescriptions Disp Refills   OZEMPIC, 1 MG/DOSE, 4 MG/3ML SOPN [Pharmacy Med Name: OZEMPIC 4 MG/3 ML (1 MG/DOSE)]  4    Sig: INJECT 1 MG ONCE A WEEK AS DIRECTED     Endocrinology:  Diabetes - GLP-1 Receptor Agonists - semaglutide Failed - 09/26/2023  3:52 PM      Failed - HBA1C in normal range and within 180 days    HB A1C (BAYER DCA - WAIVED)  Date Value Ref Range Status  11/17/2022 6.3 (H) 4.8 - 5.6 % Final    Comment:             Prediabetes: 5.7 - 6.4          Diabetes: >6.4          Glycemic control for adults with diabetes: <7.0    Hgb A1c MFr Bld  Date Value Ref Range Status  05/22/2023 7.0 (H) 4.8 - 5.6 % Final    Comment:             Prediabetes: 5.7 - 6.4          Diabetes: >6.4          Glycemic control for adults with diabetes: <7.0          Passed - Cr in normal range and within 360 days    Creatinine, Ser  Date Value Ref Range Status  05/22/2023 0.71 0.57 - 1.00 mg/dL Final         Passed - Valid encounter within last 6 months    Recent Outpatient Visits           4 months ago Controlled type 2 diabetes with neuropathy (HCC)   [place] [facility] [place], Dorie Rank, NP   6 months ago COVID-19   [place] [employer], Erin E, PA-C   10 months ago Controlled type 2 diabetes with neuropathy (HCC)   [place] [facility] [place], Corrie Dandy T, NP   1 year ago Controlled type 2 diabetes with neuropathy (HCC)   [place] [facility] [place], Corrie Dandy T, NP   1 year ago Controlled type 2 diabetes with neuropathy (HCC)   [place] [facility] [place], Dorie Rank, NP       Future Appointments             In 1 month Cannady, Dorie Rank, NP [place] [facility], [facility]

## 2023-10-05 LAB — HM DIABETES EYE EXAM

## 2023-10-11 ENCOUNTER — Other Ambulatory Visit: Payer: Self-pay | Admitting: Nurse Practitioner

## 2023-10-11 NOTE — Telephone Encounter (Signed)
 Request is too soon, last refill 09/11/23.  Requested Prescriptions  Pending Prescriptions Disp Refills   ibuprofen (ADVIL) 800 MG tablet [Pharmacy Med Name: IBUPROFEN 800 MG TABLET] 90 tablet 0    Sig: TAKE 1 TABLET BY MOUTH EVERY 8 HOURS AS NEEDED     Analgesics:  NSAIDS Failed - 10/11/2023  3:09 PM      Failed - Manual Review: Labs are only required if the patient has taken medication for more than 8 weeks.      Passed - Cr in normal range and within 360 days    Creatinine, Ser  Date Value Ref Range Status  05/22/2023 0.71 0.57 - 1.00 mg/dL Final         Passed - HGB in normal range and within 360 days    Hemoglobin  Date Value Ref Range Status  05/22/2023 14.2 11.1 - 15.9 g/dL Final         Passed - PLT in normal range and within 360 days    Platelets  Date Value Ref Range Status  05/22/2023 266 150 - 450 x10E3/uL Final         Passed - HCT in normal range and within 360 days    Hematocrit  Date Value Ref Range Status  05/22/2023 42.6 34.0 - 46.6 % Final         Passed - eGFR is 30 or above and within 360 days    GFR calc Af Amer  Date Value Ref Range Status  07/17/2020 124 >59 mL/min/1.73 Final    Comment:    **In accordance with recommendations from the NKF-ASN Task force,**   [facility] is in the process of updating its eGFR calculation to the   2021 CKD-EPI creatinine equation that estimates kidney function   without a race variable.    GFR calc non Af Amer  Date Value Ref Range Status  07/17/2020 108 >59 mL/min/1.73 Final   eGFR  Date Value Ref Range Status  05/22/2023 102 >59 mL/min/1.73 Final         Passed - Patient is not pregnant      Passed - Valid encounter within last 12 months    Recent Outpatient Visits           4 months ago Controlled type 2 diabetes with neuropathy (HCC)   [place] [facility] [place], Dorie Rank, NP   7 months ago COVID-19   [place] [facility] Mecum, Erin E, PA-C   10 months ago  Controlled type 2 diabetes with neuropathy (HCC)   [place] [facility] [place], Corrie Dandy T, NP   1 year ago Controlled type 2 diabetes with neuropathy (HCC)   [place] [facility] [place], Corrie Dandy T, NP   1 year ago Controlled type 2 diabetes with neuropathy (HCC)   [place] [facility] [place], Dorie Rank, NP       Future Appointments             In 1 month Cannady, Dorie Rank, NP [facility] [facility], [facility]

## 2023-10-12 ENCOUNTER — Encounter: Payer: Self-pay | Admitting: Nurse Practitioner

## 2023-10-20 ENCOUNTER — Encounter: Payer: Self-pay | Admitting: Nurse Practitioner

## 2023-11-13 DIAGNOSIS — E119 Type 2 diabetes mellitus without complications: Secondary | ICD-10-CM | POA: Insufficient documentation

## 2023-11-13 NOTE — Patient Instructions (Signed)
 Be Involved in Caring For Your Health:  Taking Medications When medications are taken as directed, they can greatly improve your health. But if they are not taken as prescribed, they may not work. In some cases, not taking them correctly can be harmful. To help ensure your treatment remains effective and safe, understand your medications and how to take them. Bring your medications to each visit for review by your provider.  Your lab results, notes, and after visit summary will be available on My Chart. We strongly encourage you to use this feature. If lab results are abnormal the clinic will contact you with the appropriate steps. If the clinic does not contact you assume the results are satisfactory. You can always view your results on My Chart. If you have questions regarding your health or results, please contact the clinic during office hours. You can also ask questions on My Chart.  We at [facility] are grateful that you chose [place] to provide your care. We strive to provide evidence-based and compassionate care and are always looking for feedback. If you get a survey from the clinic please complete this so we can hear your opinions.  Diabetes Mellitus and Foot Care Diabetes, also called diabetes mellitus, may cause problems with your feet and legs because of poor blood flow (circulation). Poor circulation may make your skin: Become thinner and drier. Break more easily. Heal more slowly. Peel and crack. You may also have nerve damage (neuropathy). This can cause decreased feeling in your legs and feet. This means that you may not notice minor injuries to your feet that could lead to more serious problems. Finding and treating problems early is the best way to prevent future foot problems. How to care for your feet Foot hygiene  Wash your feet daily with warm water and mild soap. Do not use hot water. Then, pat your feet and the areas between your toes until they are fully dry. Do  not soak your feet. This can dry your skin. Trim your toenails straight across. Do not dig under them or around the cuticle. File the edges of your nails with an emery board or nail file. Apply a moisturizing lotion or petroleum jelly to the skin on your feet and to dry, brittle toenails. Use lotion that does not contain alcohol and is unscented. Do not apply lotion between your toes. Shoes and socks Wear clean socks or stockings every day. Make sure they are not too tight. Do not wear knee-high stockings. These may decrease blood flow to your legs. Wear shoes that fit well and have enough cushioning. Always look in your shoes before you put them on to be sure there are no objects inside. To break in new shoes, wear them for just a few hours a day. This prevents injuries on your feet. Wounds, scrapes, corns, and calluses  Check your feet daily for blisters, cuts, bruises, sores, and redness. If you cannot see the bottom of your feet, use a mirror or ask someone for help. Do not cut off corns or calluses or try to remove them with medicine. If you find a minor scrape, cut, or break in the skin on your feet, keep it and the skin around it clean and dry. You may clean these areas with mild soap and water. Do not clean the area with peroxide, alcohol, or iodine. If you have a wound, scrape, corn, or callus on your foot, look at it several times a day to make sure it  is healing and not infected. Check for: Redness, swelling, or pain. Fluid or blood. Warmth. Pus or a bad smell. General tips Do not cross your legs. This may decrease blood flow to your feet. Do not use heating pads or hot water bottles on your feet. They may burn your skin. If you have lost feeling in your feet or legs, you may not know this is happening until it is too late. Protect your feet from hot and cold by wearing shoes, such as at the beach or on hot pavement. Schedule a complete foot exam at least once a year or more often if  you have foot problems. Report any cuts, sores, or bruises to your health care provider right away. Where to find more information American Diabetes Association: diabetes.org Association of Diabetes Care & Education Specialists: diabeteseducator.org Contact a health care provider if: You have a condition that increases your risk of infection, and you have any cuts, sores, or bruises on your feet. You have an injury that is not healing. You have redness on your legs or feet. You feel burning or tingling in your legs or feet. You have pain or cramps in your legs and feet. Your legs or feet are numb. Your feet always feel cold. You have pain around any toenails. Get help right away if: You have a wound, scrape, corn, or callus on your foot and: You have signs of infection. You have a fever. You have a red line going up your leg. This information is not intended to replace advice given to you by your health care provider. Make sure you discuss any questions you have with your health care provider. Document Revised: 01/26/2022 Document Reviewed: 01/26/2022 Elsevier Patient Education  2024 [employer].

## 2023-11-20 ENCOUNTER — Ambulatory Visit: Payer: Self-pay | Admitting: Nurse Practitioner

## 2023-11-20 VITALS — BP 137/77 | HR 85 | Temp 98.4°F | Ht 66.5 in | Wt 187.6 lb

## 2023-11-20 DIAGNOSIS — F17210 Nicotine dependence, cigarettes, uncomplicated: Secondary | ICD-10-CM

## 2023-11-20 DIAGNOSIS — E1140 Type 2 diabetes mellitus with diabetic neuropathy, unspecified: Secondary | ICD-10-CM

## 2023-11-20 DIAGNOSIS — E1169 Type 2 diabetes mellitus with other specified complication: Secondary | ICD-10-CM | POA: Diagnosis not present

## 2023-11-20 DIAGNOSIS — E1159 Type 2 diabetes mellitus with other circulatory complications: Secondary | ICD-10-CM

## 2023-11-20 DIAGNOSIS — E119 Type 2 diabetes mellitus without complications: Secondary | ICD-10-CM | POA: Diagnosis not present

## 2023-11-20 DIAGNOSIS — I359 Nonrheumatic aortic valve disorder, unspecified: Secondary | ICD-10-CM

## 2023-11-20 DIAGNOSIS — I152 Hypertension secondary to endocrine disorders: Secondary | ICD-10-CM

## 2023-11-20 DIAGNOSIS — J432 Centrilobular emphysema: Secondary | ICD-10-CM

## 2023-11-20 DIAGNOSIS — Z7985 Long-term (current) use of injectable non-insulin antidiabetic drugs: Secondary | ICD-10-CM

## 2023-11-20 DIAGNOSIS — E66811 Obesity, class 1: Secondary | ICD-10-CM

## 2023-11-20 DIAGNOSIS — F418 Other specified anxiety disorders: Secondary | ICD-10-CM

## 2023-11-20 DIAGNOSIS — E6609 Other obesity due to excess calories: Secondary | ICD-10-CM

## 2023-11-20 DIAGNOSIS — I700 Atherosclerosis of aorta: Secondary | ICD-10-CM

## 2023-11-20 NOTE — Assessment & Plan Note (Signed)
 Recommend complete cessation of smoking. Patient reports that she is not interested at this time in quitting. Has no interest in OTC tobacco cessation products or prescription products. Continue annual Lung cancer screening.

## 2023-11-20 NOTE — Assessment & Plan Note (Addendum)
 Stable. Continue Ozempic 2 mg weekly. If unable to tolerate this dose or begin experiencing adverse reactions, notify provider immediately. Return in 6 months for annual physical.

## 2023-11-20 NOTE — Assessment & Plan Note (Signed)
 Chronic, stable. Continue current plan of care. Consult with cardiology if any changes noted. Return in 6 months for annual physical.

## 2023-11-20 NOTE — Assessment & Plan Note (Signed)
 BMI 29.83. Continues to have some weight loss with Ozempic. Recommend eating smaller high protein, low fat meals more frequently and exercising 5 times weekly for 30 mins. per day. Patient understands these recommendations. Return in 6 months for annual physical.

## 2023-11-20 NOTE — Assessment & Plan Note (Signed)
 Chronic, stable. BP at goal in office today. Continue Lisinopril 10 mg daily. This offers kidney protection. Labs CMP, Microalbumin. Recommend to monitor BP at home at least 3 times weekly and document. Return in 6 months for annual physical.

## 2023-11-20 NOTE — Assessment & Plan Note (Signed)
 Chronic, stable. Albuterol inhaler as needed. Recommend complete cessation of smoking. Continue annual lung cancer screening. Follow up in 6 months for annual physical.

## 2023-11-20 NOTE — Assessment & Plan Note (Addendum)
 Chronic, stable. Current stressors include mother with dementia and work/life balance. Continue current medication regimen Hydroxyzine 25 mg daily. Notify provider with any new issues. Denies SI/HI.

## 2023-11-20 NOTE — Progress Notes (Deleted)
 BP 137/77   Pulse 85   Temp 98.4 F (36.9 C) (Oral)   Ht 5' 6.5" (1.689 m)   Wt 187 lb 9.6 oz (85.1 kg)   LMP 11/06/2017 (Approximate)   SpO2 96%   BMI 29.83 kg/m    Subjective:    Patient ID: Holly Morales, female    DOB: [birth year], 53 y.o.   MRN: [id number]  HPI: Holly Morales is a 53 y.o. female  Chief Complaint  Patient presents with   Diabetes   Hyperlipidemia   Hypertension   DIABETES A1c April was 6.3%. Continues on Ozempic 1 MG weekly.  Previously took [place], but insurance stopped covering.  Hypoglycemic episodes:{Blank single:19197::"yes","no"} Polydipsia/polyuria: {Blank single:19197::"yes","no"} Visual disturbance: {Blank single:19197::"yes","no"} Chest pain: {Blank single:19197::"yes","no"} Paresthesias: {Blank single:19197::"yes","no"} Glucose Monitoring: {Blank single:19197::"yes","no"}  Accucheck frequency: {Blank single:19197::"Not Checking","Daily","BID","TID"}  Fasting glucose:  Post prandial:  Evening:  Before meals: Taking Insulin?: {Blank single:19197::"yes","no"}  Long acting insulin:  Short acting insulin: Blood Pressure Monitoring: {Blank single:19197::"not checking","rarely","daily","weekly","monthly","a few times a day","a few times a week","a few times a month"} Retinal Examination: {Blank single:19197::"Up to Date","Not up to Date"} Foot Exam: {Blank single:19197::"Up to Date","Not up to Date"} Diabetic Education: {Blank single:19197::"Completed","Not Completed"} Pneumovax: {Blank single:19197::"Up to Date","Not up to Date","unknown"} Influenza: {Blank single:19197::"Up to Date","Not up to Date","unknown"} Aspirin: {Blank single:19197::"yes","no"}   HYPERTENSION / HYPERLIPIDEMIA Continues on Simvastatin and Lisinopril.  Satisfied with current treatment? {Blank single:19197::"yes","no"} Duration of hypertension: {Blank single:19197::"chronic","months","years"} BP monitoring frequency: {Blank single:19197::"not  checking","rarely","daily","weekly","monthly","a few times a day","a few times a week","a few times a month"} BP range:  BP medication side effects: {Blank single:19197::"yes","no"} Duration of hyperlipidemia: {Blank single:19197::"chronic","months","years"} Cholesterol medication side effects: {Blank single:19197::"yes","no"} Cholesterol supplements: {Blank multiple:19196::"none","fish oil","niacin","red yeast rice"} Medication compliance: {Blank single:19197::"excellent compliance","good compliance","fair compliance","poor compliance"} Aspirin: {Blank single:19197::"yes","no"} Recent stressors: {Blank single:19197::"yes","no"} Recurrent headaches: {Blank single:19197::"yes","no"} Visual changes: {Blank single:19197::"yes","no"} Palpitations: {Blank single:19197::"yes","no"} Dyspnea: {Blank single:19197::"yes","no"} Chest pain: {Blank single:19197::"yes","no"} Lower extremity edema: {Blank single:19197::"yes","no"} Dizzy/lightheaded: {Blank single:19197::"yes","no"}   COPD Continues to smoke at this time 1/2 to 1 PPD. Started back to smoking 2011, has been smoking for >30 years. Started at age 86. Lung CA screening on 01/19/23 - mild centrilobular emphysema and aortic atherosclerosis, remaining same as previous. COPD status: {Blank single:19197::"controlled","uncontrolled","better","worse","exacerbated","stable"} Satisfied with current treatment?: {Blank single:19197::"yes","no"} Oxygen use: {Blank single:19197::"yes","no"} Dyspnea frequency:  Cough frequency:  Rescue inhaler frequency:   Limitation of activity: {Blank single:19197::"yes","no"} Productive cough:  Last Spirometry:  Pneumovax: {Blank single:19197::"Up to Date","Not up to Date","unknown"} Influenza: {Blank single:19197::"Up to Date","Not up to Date","unknown"}  ANXIETY/STRESS Mother has dementia and she is caregiver to her. Takes Vistaril 25 MG BID PRN. Past took Celexa, but stopped as worried about side effects.   Duration:{Blank single:19197::"controlled","uncontrolled","better","worse","exacerbated","stable"} Anxious mood: {Blank single:19197::"yes","no"}  Excessive worrying: {Blank single:19197::"yes","no"} Irritability: {Blank single:19197::"yes","no"}  Sweating: {Blank single:19197::"yes","no"} Nausea: {Blank single:19197::"yes","no"} Palpitations:{Blank single:19197::"yes","no"} Hyperventilation: {Blank single:19197::"yes","no"} Panic attacks: {Blank single:19197::"yes","no"} Agoraphobia: {Blank single:19197::"yes","no"}  Obscessions/compulsions: {Blank single:19197::"yes","no"} Depressed mood: {Blank single:19197::"yes","no"}    05/22/2023    8:48 AM 11/17/2022    8:19 AM 08/16/2022    8:16 AM 05/16/2022    8:19 AM 12/17/2021    4:23 PM  Depression screen PHQ 2/9  Decreased Interest 0 0 0 1 0  Down, Depressed, Hopeless 0 0 1 0 0  PHQ - 2 Score 0 0 1 1 0  Altered sleeping 0 0 1 2 0  Tired, decreased energy 2 0 1 2 1   Change in appetite 0 0 0 0 0  Feeling bad or failure about yourself  0 0 0 0 0  Trouble concentrating 0 1 0 0 0  Moving slowly or fidgety/restless 0 0 0 0 0  Suicidal thoughts 0 0 0 0 0  PHQ-9 Score 2 1 3 5 1   Difficult doing work/chores Very difficult Not difficult at all Not difficult at all Not difficult at all Somewhat difficult  Anhedonia: {Blank single:19197::"yes","no"} Weight changes: {Blank single:19197::"yes","no"} Insomnia: {Blank single:19197::"yes","no"} {Blank single:19197::"hard to fall asleep","hard to stay asleep"}  Hypersomnia: {Blank single:19197::"yes","no"} Fatigue/loss of energy: {Blank single:19197::"yes","no"} Feelings of worthlessness: {Blank single:19197::"yes","no"} Feelings of guilt: {Blank single:19197::"yes","no"} Impaired concentration/indecisiveness: {Blank single:19197::"yes","no"} Suicidal ideations: {Blank single:19197::"yes","no"}  Crying spells: {Blank single:19197::"yes","no"} Recent Stressors/Life Changes: {Blank  single:19197::"yes","no"}   Relationship problems: {Blank single:19197::"yes","no"}   Family stress: {Blank single:19197::"yes","no"}     Financial stress: {Blank single:19197::"yes","no"}    Job stress: {Blank single:19197::"yes","no"}    Recent death/loss: {Blank single:19197::"yes","no"}     05/22/2023    8:48 AM 11/17/2022    8:20 AM 08/16/2022    8:17 AM 05/16/2022    8:19 AM  GAD 7 : Generalized Anxiety Score  Nervous, Anxious, on Edge 2 2 2 1   Control/stop worrying 2 1 1  0  Worry too much - different things 2 1 1  0  Trouble relaxing 1 1 0 0  Restless 1 0 1 0  Easily annoyed or irritable 0 1 0 0  Afraid - awful might happen 2 1 2 2   Total GAD 7 Score 10 7 7 3   Anxiety Difficulty Very difficult Somewhat difficult Not difficult at all Not difficult at all   Relevant past medical, surgical, family and social history reviewed and updated as indicated. Interim medical history since our last visit reviewed. Allergies and medications reviewed and updated.  Review of Systems  Per HPI unless specifically indicated above     Objective:    BP 137/77   Pulse 85   Temp 98.4 F (36.9 C) (Oral)   Ht 5' 6.5" (1.689 m)   Wt 187 lb 9.6 oz (85.1 kg)   LMP 11/06/2017 (Approximate)   SpO2 96%   BMI 29.83 kg/m   Wt Readings from Last 3 Encounters:  11/20/23 187 lb 9.6 oz (85.1 kg)  05/22/23 193 lb 9.6 oz (87.8 kg)  11/17/22 194 lb 4.8 oz (88.1 kg)    Physical Exam  Results for orders placed or performed in visit on 10/12/23  [facility] DIABETES EYE EXAM   Collection Time: 10/05/23  4:23 PM  Result Value Ref Range   [facility] Diabetic Eye Exam No Retinopathy No Retinopathy      Assessment & Plan:   Problem List Items Addressed This Visit       Cardiovascular and Mediastinum   Aortic atherosclerosis (HCC)   Aortic valve disorder   Hypertension associated with diabetes (HCC)     Respiratory   Centrilobular emphysema (HCC)     Endocrine   Controlled type 2 diabetes with neuropathy  (HCC) - Primary   Diabetes mellitus treated with injections of non-insulin medication (HCC)   Relevant Orders   HgB A1c   Urine Microalbumin w/creat. ratio   Hyperlipidemia associated with type 2 diabetes mellitus (HCC)   Relevant Orders   Comprehensive metabolic panel with GFR   Lipid Panel w/o Chol/HDL Ratio     Other   Nicotine dependence, cigarettes, uncomplicated   Obesity   Situational anxiety     Follow up plan: No follow-ups on file.

## 2023-11-20 NOTE — Progress Notes (Signed)
 BP 137/77   Pulse 85   Temp 98.4 F (36.9 C) (Oral)   Ht 5' 6.5" (1.689 m)   Wt 187 lb 9.6 oz (85.1 kg)   LMP 11/06/2017 (Approximate)   SpO2 96%   BMI 29.83 kg/m    Subjective:    Patient ID: Holly Morales, female    DOB: 1971-04-04, 53 y.o.   MRN: [id number]  HPI: Holly Morales is a 53 y.o. female reports cramping at night that interferes with rest.  Chief Complaint  Patient presents with   Diabetes   Hyperlipidemia   Hypertension   NOTE WRITTEN BY DNP STUDENT.  ASSESSMENT AND PLAN OF CARE REVIEWED WITH STUDENT, AGREE WITH ABOVE FINDINGS AND PLAN.   DIABETES A1c 7% October. Continues on Ozempic 1 MG weekly.  Previously took [place], but insurance stopped covering.  Hypoglycemic episodes:no Polydipsia/polyuria: no Visual disturbance: no Chest pain: no Paresthesias: no Glucose Monitoring: yes  Accucheck frequency:  2-3 times per weekly  Before meals: 120 Taking Insulin?: no Blood Pressure Monitoring: not checking Retinal Examination: Up to Date Foot Exam: Up to Date Diabetic Education: Completed Pneumovax: Not up to Date Influenza: Up to Date Aspirin: yes   HYPERTENSION / HYPERLIPIDEMIA Continues on Simvastatin and Lisinopril.  Satisfied with current treatment? yes Duration of hypertension: years BP monitoring frequency: not checking BP medication side effects: no Duration of hyperlipidemia: years Cholesterol medication side effects: no Cholesterol supplements: none Medication compliance: good compliance Aspirin: yes Recent stressors: yes Recurrent headaches: no Visual changes: no Palpitations: no Dyspnea: no Chest pain: no Lower extremity edema: no Dizzy/lightheaded: no   COPD Continues to smoke at this time 1/2 to 1 PPD. Started back to smoking 2011, has been smoking for >30 years. Started at age 48. Lung CA screening on 01/19/23 - mild centrilobular emphysema and aortic atherosclerosis, remaining same as previous. COPD status: stable Satisfied  with current treatment?: yes Oxygen use: no Rescue inhaler frequency:  as needed Limitation of activity: no Pneumovax: Not up to Date Influenza: Up to Date  ANXIETY/STRESS Mother has dementia and she is caregiver to her. Takes Vistaril 25 MG BID PRN. Past took Celexa, but stopped as worried about side effects.  Duration:stable Anxious mood: no  Excessive worrying: no Irritability: no  Sweating: no Nausea: no Palpitations:no Hyperventilation: no Panic attacks: no Agoraphobia: no  Obscessions/compulsions: no Depressed mood: no    11/20/2023    8:18 AM 05/22/2023    8:48 AM 11/17/2022    8:19 AM 08/16/2022    8:16 AM 05/16/2022    8:19 AM  Depression screen PHQ 2/9  Decreased Interest 1 0 0 0 1  Down, Depressed, Hopeless 0 0 0 1 0  PHQ - 2 Score 1 0 0 1 1  Altered sleeping 1 0 0 1 2  Tired, decreased energy 0 2 0 1 2  Change in appetite 0 0 0 0 0  Feeling bad or failure about yourself  0 0 0 0 0  Trouble concentrating 0 0 1 0 0  Moving slowly or fidgety/restless 0 0 0 0 0  Suicidal thoughts 0 0 0 0 0  PHQ-9 Score 2 2 1 3 5   Difficult doing work/chores Somewhat difficult Very difficult Not difficult at all Not difficult at all Not difficult at all  Anhedonia: no Weight changes: no Insomnia: no hard to stay asleep  Hypersomnia: no Fatigue/loss of energy: no Feelings of worthlessness: no Feelings of guilt: no Impaired concentration/indecisiveness: no Suicidal ideations: no  Crying  spells:  sometimes Recent Stressors/Life Changes: yes   Relationship problems: no   Family stress: yes     Financial stress: no    Job stress: yes    Recent death/loss: no     2023/12/03    8:18 AM 05/22/2023    8:48 AM 11/17/2022    8:20 AM 08/16/2022    8:17 AM  GAD 7 : Generalized Anxiety Score  Nervous, Anxious, on Edge 1 2 2 2   Control/stop worrying 0 2 1 1   Worry too much - different things 1 2 1 1   Trouble relaxing 0 1 1 0  Restless 0 1 0 1  Easily annoyed or irritable 0 0 1 0   Afraid - awful might happen 0 2 1 2   Total GAD 7 Score 2 10 7 7   Anxiety Difficulty Somewhat difficult Very difficult Somewhat difficult Not difficult at all   Relevant past medical, surgical, family and social history reviewed and updated as indicated. Interim medical history since our last visit reviewed. Allergies and medications reviewed and updated.  Review of Systems  Constitutional:  Negative for fatigue.  HENT:  Positive for congestion.        Seasonal allergies.  Eyes: Negative.   Respiratory:  Negative for chest tightness and shortness of breath.   Gastrointestinal: Negative.   Endocrine: Negative for polydipsia and polyuria.  Genitourinary:  Negative for difficulty urinating.  Musculoskeletal: Negative.   Skin:  Negative for rash and wound.  Allergic/Immunologic: Positive for environmental allergies.  Neurological:  Negative for light-headedness and headaches.  Hematological: Negative.   Psychiatric/Behavioral:  Negative for suicidal ideas. The patient is not nervous/anxious.     Per HPI unless specifically indicated above     Objective:    BP 137/77   Pulse 85   Temp 98.4 F (36.9 C) (Oral)   Ht 5' 6.5" (1.689 m)   Wt 187 lb 9.6 oz (85.1 kg)   LMP 11/06/2017 (Approximate)   SpO2 96%   BMI 29.83 kg/m   Wt Readings from Last 3 Encounters:  2023/12/03 187 lb 9.6 oz (85.1 kg)  05/22/23 193 lb 9.6 oz (87.8 kg)  11/17/22 194 lb 4.8 oz (88.1 kg)    Physical Exam Vitals and nursing note reviewed.  Constitutional:      Appearance: Normal appearance. She is well-developed, well-groomed and normal weight. She is not ill-appearing.  Neck:     Thyroid: No thyroid mass.     Vascular: No carotid bruit.  Cardiovascular:     Rate and Rhythm: Normal rate and regular rhythm.     Heart sounds: Normal heart sounds. No murmur heard. Pulmonary:     Effort: Pulmonary effort is normal. No respiratory distress.     Breath sounds: Normal breath sounds. No wheezing.   Abdominal:     General: Bowel sounds are normal.     Palpations: Abdomen is soft.  Musculoskeletal:        General: Normal range of motion.     Cervical back: Normal range of motion and neck supple.     Right lower leg: No edema.     Left lower leg: No edema.  Lymphadenopathy:     Cervical: No cervical adenopathy.     Right cervical: No superficial cervical adenopathy.    Left cervical: No superficial cervical adenopathy.  Skin:    General: Skin is warm and dry.  Neurological:     General: No focal deficit present.     Mental Status: She is  alert and oriented to person, place, and time.     Deep Tendon Reflexes: Reflexes are normal and symmetric.  Psychiatric:        Attention and Perception: Attention and perception normal.        Mood and Affect: Mood and affect normal.        Speech: Speech normal.        Behavior: Behavior normal. Behavior is cooperative.        Thought Content: Thought content normal.        Cognition and Memory: Cognition and memory normal.        Judgment: Judgment normal.     Results for orders placed or performed in visit on 10/12/23  HM DIABETES EYE EXAM   Collection Time: 10/05/23  4:23 PM  Result Value Ref Range   HM Diabetic Eye Exam No Retinopathy No Retinopathy      Assessment & Plan:   Problem List Items Addressed This Visit       Cardiovascular and Mediastinum   Aortic valve disorder   Chronic, stable. Continue current plan of care. Consult with cardiology if any changes noted. Return in 6 months for annual physical.      Hypertension associated with diabetes (HCC)   Chronic, stable. BP at goal in office today. Continue Lisinopril 10 mg daily. This offers kidney protection. Labs CMP, Microalbumin. Recommend to monitor BP at home at least 3 times weekly and document. Return in 6 months for annual physical.      Aortic atherosclerosis (HCC)   Chronic, stable. Continue current medication regimen, Simvastatin 40 mg at bedtime.  Recommend complete cessation of smoking. Reports quit smoking for 8 yrs but life stressors caused her to start smoking again. Follow up in 6 months for annual physical.        Respiratory   Centrilobular emphysema (HCC)   Chronic, stable. Albuterol inhaler as needed. Recommend complete cessation of smoking. Continue annual lung cancer screening. Follow up in 6 months for annual physical.        Endocrine   Controlled type 2 diabetes with neuropathy (HCC) - Primary   Chronic, stable. Last A1c 7.0% 05/22/23. Labs A1c, Microalbumin, will notify when labs return. Ozempic 2 mg weekly, tolerating this dosage. Recommend to continue to monitor BS at home and document. Return in 6 months for annual physical.  -Eye and foot exams up to date -Statin and ACE on board -Vaccinations up to date.      Hyperlipidemia associated with type 2 diabetes mellitus (HCC)   Chronic, stable. Continue current medication regimen. Will make adjustments as needed. Lipid panel today, will notify of results. Return in 6 months for annual physical.      Relevant Orders   Comprehensive metabolic panel with GFR   Lipid Panel w/o Chol/HDL Ratio   Diabetes mellitus treated with injections of non-insulin medication (HCC)   Stable. Continue Ozempic 2 mg weekly. If unable to tolerate this dose or begin experiencing adverse reactions, notify provider immediately. Return in 6 months for annual physical.      Relevant Orders   HgB A1c   Urine Microalbumin w/creat. ratio     Other   Obesity   BMI 29.83. Continues to have some weight loss with Ozempic. Recommend eating smaller high protein, low fat meals more frequently and exercising 5 times weekly for 30 mins. per day. Patient understands these recommendations. Return in 6 months for annual physical.      Nicotine dependence, cigarettes, uncomplicated  Recommend complete cessation of smoking. Patient reports that she is not interested at this time in quitting. Has no  interest in OTC tobacco cessation products or prescription products. Continue annual Lung cancer screening.       Situational anxiety   Chronic, stable. Current stressors include mother with dementia and work/life balance. Continue current medication regimen Hydroxyzine 25 mg daily. Notify provider with any new issues. Denies SI/HI.        Follow up plan: Return in about 6 months (around 05/21/2024) for Annual Physical after 05/21/24.

## 2023-11-20 NOTE — Assessment & Plan Note (Addendum)
 Chronic, stable. Last A1c 7.0% 05/22/23. Labs A1c, Microalbumin, will notify when labs return. Ozempic 2 mg weekly, tolerating this dosage. Recommend to continue to monitor BS at home and document. Return in 6 months for annual physical.  -Eye and foot exams up to date -Statin and ACE on board -Vaccinations up to date.

## 2023-11-20 NOTE — Assessment & Plan Note (Signed)
 Chronic, stable. Continue current medication regimen, Simvastatin 40 mg at bedtime. Recommend complete cessation of smoking. Reports quit smoking for 8 yrs but life stressors caused her to start smoking again. Follow up in 6 months for annual physical.

## 2023-11-20 NOTE — Assessment & Plan Note (Signed)
 Chronic, stable. Continue current medication regimen. Will make adjustments as needed. Lipid panel today, will notify of results. Return in 6 months for annual physical.

## 2023-11-21 ENCOUNTER — Encounter: Payer: Self-pay | Admitting: Nurse Practitioner

## 2023-11-21 LAB — COMPREHENSIVE METABOLIC PANEL WITH GFR
ALT: 19 IU/L (ref 0–32)
AST: 19 IU/L (ref 0–40)
Albumin: 4.9 g/dL (ref 3.8–4.9)
Alkaline Phosphatase: 100 IU/L (ref 44–121)
BUN/Creatinine Ratio: 24 — ABNORMAL HIGH (ref 9–23)
BUN: 15 mg/dL (ref 6–24)
Bilirubin Total: <0.2 mg/dL (ref 0.0–1.2)
CO2: 22 mmol/L (ref 20–29)
Calcium: 10 mg/dL (ref 8.7–10.2)
Chloride: 103 mmol/L (ref 96–106)
Creatinine, Ser: 0.63 mg/dL (ref 0.57–1.00)
Globulin, Total: 2.6 g/dL (ref 1.5–4.5)
Glucose: 126 mg/dL — ABNORMAL HIGH (ref 70–99)
Potassium: 4.4 mmol/L (ref 3.5–5.2)
Sodium: 141 mmol/L (ref 134–144)
Total Protein: 7.5 g/dL (ref 6.0–8.5)
eGFR: 105 mL/min/{1.73_m2} (ref >59)

## 2023-11-21 LAB — HEMOGLOBIN A1C
Est. average glucose Bld gHb Est-mCnc: 146 mg/dL
Hgb A1c MFr Bld: 6.7 % — ABNORMAL HIGH (ref 4.8–5.6)

## 2023-11-21 LAB — LIPID PANEL W/O CHOL/HDL RATIO
Cholesterol, Total: 159 mg/dL (ref 100–199)
HDL: 61 mg/dL (ref >39)
LDL Chol Calc (NIH): 80 mg/dL (ref 0–99)
Triglycerides: 98 mg/dL (ref 0–149)
VLDL Cholesterol Cal: 18 mg/dL (ref 5–40)

## 2023-11-21 LAB — MICROALBUMIN / CREATININE URINE RATIO
Creatinine, Urine: 19.6 mg/dL
Microalb/Creat Ratio: <15 mg/g{creat} (ref 0–29)
Microalbumin, Urine: <3 ug/mL

## 2023-11-21 NOTE — Progress Notes (Signed)
 Contacted via MyChart   Good morning Becci, your labs have returned: - A1c 6.7%, a trend down from 7% last check.  Great news!!  Continue current medication. - Kidney function, creatinine and eGFR, remains normal, as is liver function, AST and ALT.  - Lipid panel remains stable.  Any questions? Keep being amazing!!  Thank you for allowing me to participate in your care.  I appreciate you. Kindest regards, Hikaru Delorenzo

## 2024-01-26 ENCOUNTER — Other Ambulatory Visit: Payer: Self-pay | Admitting: Acute Care

## 2024-01-26 DIAGNOSIS — Z87891 Personal history of nicotine dependence: Secondary | ICD-10-CM

## 2024-01-26 DIAGNOSIS — Z122 Encounter for screening for malignant neoplasm of respiratory organs: Secondary | ICD-10-CM

## 2024-01-26 DIAGNOSIS — F17210 Nicotine dependence, cigarettes, uncomplicated: Secondary | ICD-10-CM

## 2024-02-01 ENCOUNTER — Telehealth: Payer: Self-pay

## 2024-02-06 ENCOUNTER — Encounter: Payer: Self-pay | Admitting: Acute Care

## 2024-02-12 NOTE — Telephone Encounter (Signed)
 CT scheduled for 03/12/2024

## 2024-02-15 ENCOUNTER — Other Ambulatory Visit

## 2024-03-12 ENCOUNTER — Inpatient Hospital Stay
Admission: RE | Admit: 2024-03-12 | Discharge: 2024-03-12 | Disposition: A | Discharge status: Home / Self Care | Source: Ambulatory Visit | Attending: Acute Care | Admitting: Acute Care

## 2024-03-12 DIAGNOSIS — F17210 Nicotine dependence, cigarettes, uncomplicated: Secondary | ICD-10-CM

## 2024-03-12 DIAGNOSIS — Z122 Encounter for screening for malignant neoplasm of respiratory organs: Secondary | ICD-10-CM

## 2024-03-12 DIAGNOSIS — Z87891 Personal history of nicotine dependence: Secondary | ICD-10-CM

## 2024-03-27 ENCOUNTER — Other Ambulatory Visit: Payer: Self-pay | Admitting: Acute Care

## 2024-03-27 DIAGNOSIS — F17210 Nicotine dependence, cigarettes, uncomplicated: Secondary | ICD-10-CM

## 2024-03-27 DIAGNOSIS — Z87891 Personal history of nicotine dependence: Secondary | ICD-10-CM

## 2024-03-27 DIAGNOSIS — Z122 Encounter for screening for malignant neoplasm of respiratory organs: Secondary | ICD-10-CM

## 2024-05-19 NOTE — Patient Instructions (Signed)
Please call to schedule your mammogram and/or bone density: [facility] at [facility]  Address: [address], [place], [place] [address] Phone: [phone]  [facility] [address] [place],  [place]  [address] Phone: [phone]   Diabetes Mellitus and Exercise Regular exercise is important for your health, especially if you have diabetes mellitus. Exercise is not just about losing weight. It can also help you increase muscle strength and bone density and reduce body fat and stress. This can help your level of endurance and make you more fit and flexible. Why should I exercise if I have diabetes? Exercise has many benefits for people with diabetes. It can: Help lower and control your blood sugar (glucose). Help your body respond better and become more sensitive to the hormone insulin. Reduce how much insulin your body needs. Lower your risk for heart disease by: Lowering how much "bad" cholesterol and triglycerides you have in your body. Increasing how much "good" cholesterol you have in your body. Lowering your blood pressure. Lowering your blood glucose levels. What is my activity plan? Your health care provider or an expert trained in diabetes care (certified diabetes educator) can help you make an activity plan. This plan can help you find the type of exercise that works for you. It may also tell you how often to exercise and for how long. Be sure to: Get at least 150 minutes of medium-intensity or high-intensity exercise each week. This may involve brisk walking, biking, or water aerobics. Do stretching and strengthening exercises at least 2 times a week. This may involve yoga or weight lifting. Spread out your activity over at least 3 days of the week. Get some form of physical activity each day. Do not go more than 2 days in a row without some kind of activity. Avoid being inactive for more than 30 minutes at a  time. Take frequent breaks to walk or stretch. Choose activities that you enjoy. Set goals that you know you can accomplish. Start slowly and increase the intensity of your exercise over time. How do I manage my diabetes during exercise?  Monitor your blood glucose Check your blood glucose before and after you exercise. If your blood glucose is 240 mg/dL (84.1 mmol/L) or higher before you exercise, check your urine for ketones. These are chemicals created by the liver. If you have ketones in your urine, do not exercise until your blood glucose returns to normal. If your blood glucose is 100 mg/dL (5.6 mmol/L) or lower, eat a snack that has 15-20 grams of carbohydrate in it. Check your blood glucose 15 minutes after the snack to make sure that your level is above 100 mg/dL (5.6 mmol/L) before you start to exercise. Your risk for low blood glucose (hypoglycemia) goes up during and after exercise. Know the symptoms of this condition and how to treat it. Follow these instructions at home: Keep a carbohydrate snack on hand for use before, during, and after exercise. This can help prevent or treat hypoglycemia. Avoid injecting insulin into parts of your body that are going to be used during exercise. This may include: Your arms, when you are going to play tennis. Your legs, when you are about to go jogging. Keep track of your exercise habits. This can help you and your health care provider watch and adjust your activity plan. Write down: What you eat before and after you exercise. Blood glucose levels before and after you exercise.  The type and amount of exercise you do. Talk to your health care provider before you start a new activity. They may need to: Make sure that the activity is safe for you. Adjust your insulin, other medicines, and food that you eat. Drink water while you exercise. This can stop you from losing too much water (dehydration). It can also prevent problems caused by having a lot  of heat in your body (heat stroke). Where to find more information American Diabetes Association: diabetes.org Association of Diabetes Care & Education Specialists: diabeteseducator.org This information is not intended to replace advice given to you by your health care provider. Make sure you discuss any questions you have with your health care provider. Document Revised: 01/12/2022 Document Reviewed: 01/12/2022 Elsevier Patient Education  2024 [employer].

## 2024-05-23 ENCOUNTER — Encounter: Payer: Self-pay | Admitting: Nurse Practitioner

## 2024-05-23 ENCOUNTER — Ambulatory Visit: Admitting: Nurse Practitioner

## 2024-05-23 ENCOUNTER — Other Ambulatory Visit (HOSPITAL_COMMUNITY): Payer: Self-pay

## 2024-05-23 VITALS — BP 127/79 | HR 79 | Temp 98.1°F | Resp 16 | Ht 66.5 in | Wt 190.4 lb

## 2024-05-23 DIAGNOSIS — Z1231 Encounter for screening mammogram for malignant neoplasm of breast: Secondary | ICD-10-CM | POA: Diagnosis not present

## 2024-05-23 DIAGNOSIS — F418 Other specified anxiety disorders: Secondary | ICD-10-CM

## 2024-05-23 DIAGNOSIS — F17210 Nicotine dependence, cigarettes, uncomplicated: Secondary | ICD-10-CM

## 2024-05-23 DIAGNOSIS — G8929 Other chronic pain: Secondary | ICD-10-CM

## 2024-05-23 DIAGNOSIS — E1140 Type 2 diabetes mellitus with diabetic neuropathy, unspecified: Secondary | ICD-10-CM

## 2024-05-23 DIAGNOSIS — E66811 Obesity, class 1: Secondary | ICD-10-CM

## 2024-05-23 DIAGNOSIS — E119 Type 2 diabetes mellitus without complications: Secondary | ICD-10-CM | POA: Diagnosis not present

## 2024-05-23 DIAGNOSIS — E1159 Type 2 diabetes mellitus with other circulatory complications: Secondary | ICD-10-CM

## 2024-05-23 DIAGNOSIS — I152 Hypertension secondary to endocrine disorders: Secondary | ICD-10-CM

## 2024-05-23 DIAGNOSIS — J432 Centrilobular emphysema: Secondary | ICD-10-CM

## 2024-05-23 DIAGNOSIS — Z23 Encounter for immunization: Secondary | ICD-10-CM

## 2024-05-23 DIAGNOSIS — Z0000 Encounter for general adult medical examination without abnormal findings: Secondary | ICD-10-CM

## 2024-05-23 DIAGNOSIS — E1169 Type 2 diabetes mellitus with other specified complication: Secondary | ICD-10-CM | POA: Diagnosis not present

## 2024-05-23 DIAGNOSIS — E559 Vitamin D deficiency, unspecified: Secondary | ICD-10-CM

## 2024-05-23 DIAGNOSIS — M25551 Pain in right hip: Secondary | ICD-10-CM

## 2024-05-23 LAB — BAYER DCA HB A1C WAIVED: HB A1C (BAYER DCA - WAIVED): 6.3 % — ABNORMAL HIGH (ref 4.8–5.6)

## 2024-05-23 MED ORDER — SEMAGLUTIDE (2 MG/DOSE) 8 MG/3ML ~~LOC~~ SOPN: 2.0000 mg | PEN_INJECTOR | SUBCUTANEOUS | 4 refills | Status: DC

## 2024-05-23 MED ORDER — TIRZEPATIDE 5 MG/0.5ML ~~LOC~~ SOAJ: 5.0000 mg | SUBCUTANEOUS | 4 refills | Status: AC

## 2024-05-23 MED ORDER — LISINOPRIL 10 MG PO TABS: 10.0000 mg | ORAL_TABLET | Freq: Every day | ORAL | 4 refills | Status: AC

## 2024-05-23 MED ORDER — HYDROXYZINE PAMOATE 25 MG PO CAPS: 25.0000 mg | ORAL_CAPSULE | Freq: Three times a day (TID) | ORAL | 4 refills | Status: AC | PRN

## 2024-05-23 NOTE — Assessment & Plan Note (Signed)
Due to mother with dementia and work, stable at this time.  Continue Vistaril as needed.  Educated her on these and side effects + use.   Alert provider if any issues.  Denies SI/HI.

## 2024-05-23 NOTE — Assessment & Plan Note (Signed)
 Chronic. Noted on past labs, occasionally takes supplement. Recheck today.

## 2024-05-23 NOTE — Assessment & Plan Note (Signed)
 Referral to local ortho per request.

## 2024-05-23 NOTE — Assessment & Plan Note (Signed)
 BMI 30.27, weight loss has stalled on Ozempic .  Recommended eating smaller high protein, low fat meals more frequently and exercising 30 mins a day 5 times a week with a goal of 10-15lb weight loss in the next 3 months. Patient voiced their understanding and motivation to adhere to these recommendations.

## 2024-05-23 NOTE — Progress Notes (Signed)
 BP 127/79 (BP Location: Left Arm, Patient Position: Sitting, Cuff Size: Large)   Pulse 79   Temp 98.1 F (36.7 C) (Oral)   Resp 16   Ht 5' 6.5 (1.689 m)   Wt 190 lb 6.4 oz (86.4 kg)   LMP 11/06/2017 (Approximate)   SpO2 99%   BMI 30.27 kg/m    Subjective:    Patient ID: Holly Morales, female    DOB: June 18, 1971, 54 y.o.   MRN: [id number]  HPI: Holly Morales is a 53 y.o. female presenting on 05/23/2024 for comprehensive medical examination. Current medical complaints include:none  She currently lives with: significant other Menopausal Symptoms: no  DIABETES Holly Morales 6.7%. Taking Ozempic  2 MG weekly, did miss one dose. Previously took Saxenda , but insurance stopped covering.  Will be a little nauseous on day of injection, chews some bubblegum and this helps. Hypoglycemic episodes:no Polydipsia/polyuria: no Visual disturbance: no Chest pain: no Paresthesias: no Glucose Monitoring: yes             Accucheck frequency: none             Fasting glucose:              Post prandial:             Evening:             Before meals: Taking Insulin?: no             Long acting insulin:             Short acting insulin: Blood Pressure Monitoring: not checking Retinal Examination: Up To Date -- [facility] Foot Exam: Up to Date Pneumovax: Up to Date Influenza: Up to Date Aspirin: yes   HYPERTENSION / HYPERLIPIDEMIA Taking Simvastatin  and Lisinopril .  Satisfied with current treatment? yes Duration of hypertension: chronic BP monitoring frequency: not checking BP range:  BP medication side effects: no Duration of hyperlipidemia: chronic Cholesterol medication side effects: no Cholesterol supplements: none Medication compliance: good compliance Aspirin: no Recent stressors: no Recurrent headaches: no Visual changes: no Palpitations: no Dyspnea: occasional with activity Chest pain: no Lower extremity edema: no Dizzy/lightheaded: no  COPD Smokes 1/2 to 1 PPD, started  back to smoking in 2011, has been smoking for > 30 years. Was 11 when started. Lung CA cancer screening last on 03/12/24, mild centrilobular emphysema, aortic atherosclerosis, and CAD.   COPD status: stable Satisfied with current treatment?: yes Oxygen use: no Dyspnea frequency: occasional with activity Cough frequency: rare Rescue inhaler frequency: rarely Limitation of activity: no Productive cough: none Last Spirometry: unknown Pneumovax: Up to Date Influenza: Up to Date   ANXIETY/STRESS Uses Vistaril  as needed, takes one every morning and evening. Took Celexa  in past, but stopped due to concern for side effects. Mother has dementia and she helps care for her, recently had her license taken away. Duration: stable Anxious mood: occasional Excessive worrying: occasional Irritability: yes, occasional Sweating: no Nausea: no Palpitations:no Hyperventilation: no Panic attacks: no Agoraphobia: no  Obscessions/compulsions: no Depressed mood: no    05/23/2024    8:15 AM 11/20/2023    8:18 AM 05/22/2023    8:48 AM 11/17/2022    8:19 AM 08/16/2022    8:16 AM  Depression screen PHQ 2/9  Decreased Interest 0 1 0 0 0  Down, Depressed, Hopeless 0 0 0 0 1  PHQ - 2 Score 0 1 0 0 1  Altered sleeping 0 1 0 0 1  Tired, decreased energy 3  0 2 0 1  Change in appetite 0 0 0 0 0  Feeling bad or failure about yourself  0 0 0 0 0  Trouble concentrating 0 0 0 1 0  Moving slowly or fidgety/restless 0 0 0 0 0  Suicidal thoughts 0 0 0 0 0  PHQ-9 Score 3 2 2 1 3   Difficult doing work/chores  Somewhat difficult Very difficult Not difficult at all Not difficult at all       05/23/2024    8:15 AM 11/20/2023    8:18 AM 05/22/2023    8:48 AM 11/17/2022    8:20 AM  GAD 7 : Generalized Anxiety Score  Nervous, Anxious, on Edge 1 1 2 2   Control/stop worrying 1 0 2 1  Worry too much - different things 1 1 2 1   Trouble relaxing 1 0 1 1  Restless 0 0 1 0  Easily annoyed or irritable 0 0 0 1  Afraid -  awful might happen 2 0 2 1  Total GAD 7 Score 6 2 10 7   Anxiety Difficulty  Somewhat difficult Very difficult Somewhat difficult   Functional Status Survey: Is the patient deaf or have difficulty hearing?: No Does the patient have difficulty seeing, even when wearing glasses/contacts?: No Does the patient have difficulty concentrating, remembering, or making decisions?: No Does the patient have difficulty walking or climbing stairs?: No Does the patient have difficulty dressing or bathing?: No Does the patient have difficulty doing errands alone such as visiting a doctor's office or shopping?: No      08/16/2022    8:16 AM 11/17/2022    8:19 AM 05/22/2023    8:48 AM 11/20/2023    8:07 AM 05/23/2024    8:11 AM  Fall Risk  Falls in the past year? 0 0 0 0 0  Was there an injury with Fall? 0 0 0 0 0  Fall Risk Category Calculator 0 0 0 0 0  Fall Risk Category (Retired) Low       Patient at Risk for Falls Due to No Fall Risks No Fall Risks No Fall Risks No Fall Risks No Fall Risks  Fall risk Follow up Falls evaluation completed  Falls evaluation completed Falls evaluation completed Falls evaluation completed Falls evaluation completed     Data saved with a previous flowsheet row definition    Past Medical History:  Past Medical History:  Diagnosis Date   Depression    06/25/18 no longer an issue per pt    Diabetes mellitus without complication (HCC)    High cholesterol    Obesity (BMI 30-39.9)     Surgical History:  Past Surgical History:  Procedure Laterality Date   CARPAL TUNNEL RELEASE Right 07/07/2020   COLONOSCOPY WITH PROPOFOL  N/A 04/15/2021   Procedure: COLONOSCOPY WITH PROPOFOL ;  Surgeon: Therisa Bi, MD;  Location: [facility] ENDOSCOPY;  Service: Gastroenterology;  Laterality: N/A;   LEEP  2009   NO PAST SURGERIES      Medications:  Current Outpatient Medications on File Prior to Visit  Medication Sig   albuterol  (VENTOLIN  HFA) 108 (90 Base) MCG/ACT inhaler Inhale 2 puffs  into the lungs every 6 (six) hours as needed.   aspirin EC 81 MG tablet Take 81 mg by mouth daily. Swallow whole.   B-D ULTRAFINE III SHORT PEN 31G X 8 MM MISC USE ONE NEEDLE DAILY TO INJECT SAXENDA    Blood Glucose Monitoring Suppl (ONETOUCH VERIO) w/Device KIT Use to check blood sugar 3 times a  day and document results, bring to appointments.  Goal is <130 fasting blood sugar and <180 two hours after meals.   glucose blood (ONETOUCH VERIO) test strip 1 each by Other route daily at 2 PM. Use as instructed   ibuprofen  (ADVIL ) 800 MG tablet TAKE 1 TABLET BY MOUTH EVERY 8 HOURS AS NEEDED   nicotine  (NICODERM CQ  - DOSED IN MG/24 HR) 7 mg/24hr patch Place 1 patch (7 mg total) onto the skin daily.   OneTouch Delica Lancets 33G MISC USE TO TEST BLOOD SUGAR DAILY   simvastatin  (ZOCOR ) 40 MG tablet Take 1 tablet (40 mg total) by mouth at bedtime.   No current facility-administered medications on file prior to visit.    Allergies:  Allergies  Allergen Reactions   Jardiance  [Empagliflozin ]     Candidiasis    Social History:  Social History   Socioeconomic History   Marital status: Media planner    Spouse name: Not on file   Number of children: 0   Years of education: Not on file   Highest education level: 12th grade  Occupational History   Not on file  Tobacco Use   Smoking status: Every Day    Current packs/day: 0.00    Average packs/day: 0.8 packs/day for 30.0 years (22.5 ttl pk-yrs)    Types: Cigarettes    Start date: 07/05/1989    Last attempt to quit: 07/06/2019    Years since quitting: 4.8   Smokeless tobacco: Never  Vaping Use   Vaping status: Never Used  Substance and Sexual Activity   Alcohol use: Yes    Alcohol/week: 1.0 standard drink of alcohol    Types: 1 Standard drinks or equivalent per week    Comment: on occasion   Drug use: No    Comment: 06/25/18--30 yrs ago smoked marijuana   Sexual activity: Yes  Other Topics Concern   Not on file  Social History  Narrative   Lives at home alone   Right handed   Caffeine: 1 pot of coffee every morning, 1 cup of unsweet tea daily, lots of water, very seldom soda.   Social Drivers of Corporate investment banker Strain: Low Risk  (11/20/2023)   Overall Financial Resource Strain (CARDIA)    Difficulty of Paying Living Expenses: Not very hard  Food Insecurity: No Food Insecurity (11/20/2023)   Hunger Vital Sign    Worried About Running Out of Food in the Last Year: Never true    Ran Out of Food in the Last Year: Never true  Transportation Needs: No Transportation Needs (11/20/2023)   PRAPARE - Administrator, Civil Service (Medical): No    Lack of Transportation (Non-Medical): No  Physical Activity: Insufficiently Active (11/20/2023)   Exercise Vital Sign    Days of Exercise per Week: 1 day    Minutes of Exercise per Session: 10 min  Stress: Stress Concern Present (11/20/2023)   [employer] of Occupational Health - Occupational Stress Questionnaire    Feeling of Stress : To some extent  Social Connections: Socially Integrated (11/20/2023)   Social Connection and Isolation Panel    Frequency of Communication with Friends and Family: More than three times a week    Frequency of Social Gatherings with Friends and Family: Three times a week    Attends Religious Services: 1 to 4 times per year    Active Member of Clubs or Organizations: Yes    Attends Banker Meetings: More than 4 times per  year    Marital Status: Living with partner  Intimate Partner Violence: Not At Risk (05/22/2023)   Humiliation, Afraid, Rape, and Kick questionnaire    Fear of Current or Ex-Partner: No    Emotionally Abused: No    Physically Abused: No    Sexually Abused: No   Social History   Tobacco Use  Smoking Status Every Day   Current packs/day: 0.00   Average packs/day: 0.8 packs/day for 30.0 years (22.5 ttl pk-yrs)   Types: Cigarettes   Start date: 07/05/1989   Last attempt to quit:  07/06/2019   Years since quitting: 4.8  Smokeless Tobacco Never   Social History   Substance and Sexual Activity  Alcohol Use Yes   Alcohol/week: 1.0 standard drink of alcohol   Types: 1 Standard drinks or equivalent per week   Comment: on occasion    Family History:  Family History  Problem Relation Age of Onset   Dementia Mother    Diabetes Father    Cancer Father        Prostate   Irregular heart beat Father    Breast cancer Neg Hx     Past medical history, surgical history, medications, allergies, family history and social history reviewed with patient today and changes made to appropriate areas of the chart.   ROS All other ROS negative except what is listed above and in the HPI.      Objective:    BP 127/79 (BP Location: Left Arm, Patient Position: Sitting, Cuff Size: Large)   Pulse 79   Temp 98.1 F (36.7 C) (Oral)   Resp 16   Ht 5' 6.5 (1.689 m)   Wt 190 lb 6.4 oz (86.4 kg)   LMP 11/06/2017 (Approximate)   SpO2 99%   BMI 30.27 kg/m   Wt Readings from Last 3 Encounters:  05/23/24 190 lb 6.4 oz (86.4 kg)  11/20/23 187 lb 9.6 oz (85.1 kg)  05/22/23 193 lb 9.6 oz (87.8 kg)    Physical Exam Vitals and nursing note reviewed.  Constitutional:      General: She is awake. She is not in acute distress.    Appearance: She is well-developed and well-groomed. She is obese. She is not ill-appearing or toxic-appearing.  HENT:     Head: Normocephalic and atraumatic.     Right Ear: Hearing, tympanic membrane, ear canal and external ear normal. No drainage.     Left Ear: Hearing, tympanic membrane, ear canal and external ear normal. No drainage.     Nose: Nose normal.     Right Sinus: No maxillary sinus tenderness or frontal sinus tenderness.     Left Sinus: No maxillary sinus tenderness or frontal sinus tenderness.     Mouth/Throat:     Mouth: Mucous membranes are moist.     Pharynx: Oropharynx is clear. Uvula midline. No pharyngeal swelling, oropharyngeal  exudate or posterior oropharyngeal erythema.  Eyes:     General: Lids are normal.        Right eye: No discharge.        Left eye: No discharge.     Extraocular Movements: Extraocular movements intact.     Conjunctiva/sclera: Conjunctivae normal.     Pupils: Pupils are equal, round, and reactive to light.     Visual Fields: Right eye visual fields normal and left eye visual fields normal.  Neck:     Thyroid: No thyromegaly.     Vascular: No carotid bruit.     Trachea: Trachea normal.  Cardiovascular:     Rate and Rhythm: Normal rate and regular rhythm.     Heart sounds: Normal heart sounds. No murmur heard.    No gallop.  Pulmonary:     Effort: Pulmonary effort is normal. No accessory muscle usage or respiratory distress.     Breath sounds: Normal breath sounds.  Abdominal:     General: Bowel sounds are normal.     Palpations: Abdomen is soft. There is no hepatomegaly or splenomegaly.     Tenderness: There is no abdominal tenderness.  Musculoskeletal:        General: Normal range of motion.     Cervical back: Normal range of motion and neck supple.     Right lower leg: No edema.     Left lower leg: No edema.  Lymphadenopathy:     Head:     Right side of head: No submental, submandibular, tonsillar, preauricular or posterior auricular adenopathy.     Left side of head: No submental, submandibular, tonsillar, preauricular or posterior auricular adenopathy.     Cervical: No cervical adenopathy.  Skin:    General: Skin is warm and dry.     Capillary Refill: Capillary refill takes less than 2 seconds.     Findings: No rash.  Neurological:     Mental Status: She is alert and oriented to person, place, and time.     Gait: Gait is intact.     Deep Tendon Reflexes: Reflexes are normal and symmetric.     Reflex Scores:      Brachioradialis reflexes are 2+ on the right side and 2+ on the left side.      Patellar reflexes are 2+ on the right side and 2+ on the left side. Psychiatric:         Attention and Perception: Attention normal.        Mood and Affect: Mood normal.        Speech: Speech normal.        Behavior: Behavior normal. Behavior is cooperative.        Thought Content: Thought content normal.        Judgment: Judgment normal.    Diabetic Foot Exam - Simple   Simple Foot Form Visual Inspection No deformities, no ulcerations, no other skin breakdown bilaterally: Yes Sensation Testing Intact to touch and monofilament testing bilaterally: Yes Pulse Check Posterior Tibialis and Dorsalis pulse intact bilaterally: Yes Comments     Results for orders placed or performed in visit on 05/23/24  Bayer DCA Hb Morales Waived (STAT)   Collection Time: 05/23/24  9:17 AM  Result Value Ref Range   HB Morales (BAYER DCA - WAIVED) 6.3 (H) 4.8 - 5.6 %      Assessment & Plan:   Problem List Items Addressed This Visit       Cardiovascular and Mediastinum   Hypertension associated with diabetes (HCC)   Chronic, stable.  BP at goal in office today.  Continue Lisinopril , which offers kidney protection.  Urine micro ALB 3 Holly 2025.  Recommend she check BP at home at least 3 mornings a week and focus on DASH diet.  LABS: CMP, CBC, TSH.  Consider change from ACE to ARB in future due to underlying COPD.        Relevant Medications   lisinopril  (ZESTRIL ) 10 MG tablet   tirzepatide (MOUNJARO) 5 MG/0.5ML Pen   Other Relevant Orders   CBC with Differential/Platelet   TSH     Respiratory  Centrilobular emphysema (HCC)   Chronic, stable. Spirometry next visit.  Recommend complete cessation of smoking.  Continue annual lung cancer screening.  Will continue Albuterol  to use as needed before activity.  May need daily inhaler in future.        Endocrine   Hyperlipidemia associated with type 2 diabetes mellitus (HCC)   Chronic, stable.  Continue current medication regimen and adjust as needed.  Lipid panel today.      Relevant Medications   lisinopril  (ZESTRIL ) 10 MG tablet    tirzepatide (MOUNJARO) 5 MG/0.5ML Pen   Other Relevant Orders   Comprehensive metabolic panel with GFR   Lipid Panel w/o Chol/HDL Ratio   Diabetes mellitus treated with injections of non-insulin medication (HCC)   Refer to diabetes with neuropathy plan of care.      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  Urine ALB 3 Holly 2025.  Will see if can get [facility] covered, starting at 5 MG, and stop Ozempic  2 MG weekly dosing. If can get covered will plan to see patient back in 6 weeks and adjust further as needed. Recommend she continue to monitor BS at home with goal <130 in morning and <180 two hours after a meal - if elevations she is to notify provider.   - Eye and foot exams up to date - Vaccinations up to date - Statin and ACE on board      Relevant Medications   lisinopril  (ZESTRIL ) 10 MG tablet   tirzepatide (MOUNJARO) 5 MG/0.5ML Pen   Other Relevant Orders   Bayer DCA Hb Morales Waived (STAT) (Completed)     Other   Vitamin D  deficiency   Chronic. Noted on past labs, occasionally takes supplement. Recheck today.      Relevant Orders   VITAMIN D  25 Hydroxy (Vit-D Deficiency, Fractures)   Situational anxiety   Due to mother with dementia and work, stable at this time.  Continue Vistaril  as needed.  Educated her on these and side effects + use.   Alert provider if any issues.  Denies SI/HI.        Relevant Medications   hydrOXYzine  (VISTARIL ) 25 MG capsule   Obesity   BMI 30.27, weight loss has stalled on Ozempic .  Recommended eating smaller high protein, low fat meals more frequently and exercising 30 mins a day 5 times a week with a goal of 10-15lb weight loss in the next 3 months. Patient voiced their understanding and motivation to adhere to these recommendations.        Relevant Medications   tirzepatide (MOUNJARO) 5 MG/0.5ML Pen   Nicotine  dependence, cigarettes, uncomplicated   I have recommended complete cessation of tobacco use. I have discussed various options available for assistance with tobacco cessation including over the counter methods (Nicotine  gum, patch and lozenges). We also discussed prescription options (Chantix , Nicotine  Inhaler / Nasal Spray). The patient is not interested in pursuing any prescription tobacco cessation options at this time. Continue annual lung screening.      Hip pain, chronic   Referral to local ortho per request.      Relevant Orders   Ambulatory referral to Orthopedic Surgery   Other Visit Diagnoses       Flu vaccine need       Flu vaccine  ordered and educated on this.   Relevant Orders   Flu vaccine trivalent PF, 6mos and older(Flulaval,Afluria,Fluarix,Fluzone) (Completed)     Pneumococcal vaccination given       PCV20 today, educated on this.   Relevant Orders   Pneumococcal conjugate vaccine 20-valent (Completed)     Encounter for screening mammogram for malignant neoplasm of breast       Mammogram ordered and instructed how to schedule.   Relevant Orders   MM 3D SCREENING MAMMOGRAM BILATERAL BREAST     Encounter for annual physical exam       Annual physical today with labs and health maintenance reviewed, discussed with patient.        Follow up plan: Return in about 6 weeks (around 07/04/2024) for T2DM ---- virtual, changing to Mounjaro.   LABORATORY TESTING:  - Pap smear: up to date  IMMUNIZATIONS:   - Tdap: Tetanus vaccination status reviewed: last tetanus booster within 10 years. - Influenza: Administered today - Pneumovax: Up to date - Prevnar: Administered today - COVID: Up to date - HPV: Not applicable - Shingrix  vaccine: will schedule in future - Lung Screening: Up To Date  SCREENING: -Mammogram: Up to date  - Colonoscopy: Up to date  - Bone Density: Not applicable   -Hearing Test: Not applicable  -Spirometry: Not applicable   PATIENT COUNSELING:   Advised to take 1 mg of folate supplement per day if capable of pregnancy.   Sexuality: Discussed sexually transmitted diseases, partner selection, use of condoms, avoidance of unintended pregnancy  and contraceptive alternatives.   Advised to avoid cigarette smoking.  I discussed with the patient that most people either abstain from alcohol or drink within safe limits (<=14/week and <=4 drinks/occasion for males, <=7/weeks and <= 3 drinks/occasion for females) and that the risk for alcohol disorders and other health effects rises proportionally with the number of drinks per week and how often a drinker exceeds daily limits.  Discussed cessation/primary prevention of drug use and availability of treatment for abuse.   Diet: Encouraged to adjust caloric intake to maintain  or achieve ideal body weight, to reduce intake of dietary saturated fat and total fat, to limit sodium intake by avoiding high sodium foods and not adding table salt, and to maintain adequate dietary potassium and calcium  preferably from fresh fruits, vegetables, and low-fat dairy products.    Stressed the importance of regular exercise  Injury prevention: Discussed safety belts, safety helmets, smoke detector, smoking near bedding or upholstery.   Dental health: Discussed importance of regular tooth brushing, flossing, and dental visits.    NEXT PREVENTATIVE PHYSICAL DUE IN 1 YEAR. Return in about 6 weeks (around 07/04/2024) for T2DM ---- virtual, changing to Mounjaro.

## 2024-05-23 NOTE — Assessment & Plan Note (Addendum)
 Chronic, stable. Spirometry next visit.  Recommend complete cessation of smoking.  Continue annual lung cancer screening.  Will continue Albuterol  to use as needed before activity.  May need daily inhaler in future.

## 2024-05-23 NOTE — Assessment & Plan Note (Signed)
 Chronic, stable with current A1c 6.3% today, has no lost any further weight with Ozempic .  Urine ALB 09 November 2023.  Will see if can get [facility] covered, starting at 5 MG, and stop Ozempic  2 MG weekly dosing. If can get covered will plan to see patient back in 6 weeks and adjust further as needed. Recommend she continue to monitor BS at home with goal <130 in morning and <180 two hours after a meal - if elevations she is to notify provider.   - Eye and foot exams up to date - Vaccinations up to date - Statin and ACE on board

## 2024-05-23 NOTE — Assessment & Plan Note (Signed)
 Chronic, stable.  BP at goal in office today.  Continue Lisinopril , which offers kidney protection.  Urine micro ALB 09 November 2023.  Recommend she check BP at home at least 3 mornings a week and focus on DASH diet.  LABS: CMP, CBC, TSH.  Consider change from ACE to ARB in future due to underlying COPD.

## 2024-05-23 NOTE — Assessment & Plan Note (Signed)
I have recommended complete cessation of tobacco use. I have discussed various options available for assistance with tobacco cessation including over the counter methods (Nicotine gum, patch and lozenges). We also discussed prescription options (Chantix, Nicotine Inhaler / Nasal Spray). The patient is not interested in pursuing any prescription tobacco cessation options at this time.  Continue annual lung screening. 

## 2024-05-23 NOTE — Assessment & Plan Note (Signed)
 Refer to diabetes with neuropathy plan of care

## 2024-05-23 NOTE — Assessment & Plan Note (Signed)
 Chronic, stable.  Continue current medication regimen and adjust as needed.  Lipid panel today.

## 2024-05-24 ENCOUNTER — Other Ambulatory Visit (HOSPITAL_COMMUNITY): Payer: Self-pay

## 2024-05-24 ENCOUNTER — Ambulatory Visit: Payer: Self-pay | Admitting: Nurse Practitioner

## 2024-05-24 ENCOUNTER — Other Ambulatory Visit: Payer: Self-pay | Admitting: Nurse Practitioner

## 2024-05-24 ENCOUNTER — Telehealth: Payer: Self-pay | Admitting: Pharmacy Technician

## 2024-05-24 DIAGNOSIS — R7989 Other specified abnormal findings of blood chemistry: Secondary | ICD-10-CM

## 2024-05-24 LAB — COMPREHENSIVE METABOLIC PANEL WITH GFR
ALT: 22 IU/L (ref 0–32)
AST: 16 IU/L (ref 0–40)
Albumin: 4.8 g/dL (ref 3.8–4.9)
Alkaline Phosphatase: 93 IU/L (ref 49–135)
BUN/Creatinine Ratio: 25 — ABNORMAL HIGH (ref 9–23)
BUN: 19 mg/dL (ref 6–24)
Bilirubin Total: 0.3 mg/dL (ref 0.0–1.2)
CO2: 24 mmol/L (ref 20–29)
Calcium: 9.9 mg/dL (ref 8.7–10.2)
Chloride: 97 mmol/L (ref 96–106)
Creatinine, Ser: 0.77 mg/dL (ref 0.57–1.00)
Globulin, Total: 2.5 g/dL (ref 1.5–4.5)
Glucose: 128 mg/dL — ABNORMAL HIGH (ref 70–99)
Potassium: 4.1 mmol/L (ref 3.5–5.2)
Sodium: 138 mmol/L (ref 134–144)
Total Protein: 7.3 g/dL (ref 6.0–8.5)
eGFR: 92 mL/min/1.73 (ref >59)

## 2024-05-24 LAB — CBC WITH DIFFERENTIAL/PLATELET
Basophils Absolute: 0.1 x10E3/uL (ref 0.0–0.2)
Basos: 1 %
EOS (ABSOLUTE): 0.1 x10E3/uL (ref 0.0–0.4)
Eos: 1 %
Hematocrit: 42.8 % (ref 34.0–46.6)
Hemoglobin: 13.9 g/dL (ref 11.1–15.9)
Immature Grans (Abs): 0.1 x10E3/uL (ref 0.0–0.1)
Immature Granulocytes: 1 %
Lymphocytes Absolute: 2.2 x10E3/uL (ref 0.7–3.1)
Lymphs: 20 %
MCH: 32.7 pg (ref 26.6–33.0)
MCHC: 32.5 g/dL (ref 31.5–35.7)
MCV: 101 fL — ABNORMAL HIGH (ref 79–97)
Monocytes Absolute: 1.1 x10E3/uL — ABNORMAL HIGH (ref 0.1–0.9)
Monocytes: 9 %
Neutrophils Absolute: 7.8 x10E3/uL — ABNORMAL HIGH (ref 1.4–7.0)
Neutrophils: 68 %
Platelets: 320 x10E3/uL (ref 150–450)
RBC: 4.25 x10E6/uL (ref 3.77–5.28)
RDW: 12.2 % (ref 11.7–15.4)
WBC: 11.3 x10E3/uL — ABNORMAL HIGH (ref 3.4–10.8)

## 2024-05-24 LAB — LIPID PANEL W/O CHOL/HDL RATIO
Cholesterol, Total: 135 mg/dL (ref 100–199)
HDL: 65 mg/dL (ref >39)
LDL Chol Calc (NIH): 59 mg/dL (ref 0–99)
Triglycerides: 51 mg/dL (ref 0–149)
VLDL Cholesterol Cal: 11 mg/dL (ref 5–40)

## 2024-05-24 LAB — VITAMIN D 25 HYDROXY (VIT D DEFICIENCY, FRACTURES): Vit D, 25-Hydroxy: 57.3 ng/mL (ref 30.0–100.0)

## 2024-05-24 LAB — TSH: TSH: 1.63 u[IU]/mL (ref 0.450–4.500)

## 2024-05-24 NOTE — Telephone Encounter (Signed)
 Pharmacy Patient Advocate Encounter  Received notification from [facility] that Prior Authorization for Mounjaro 5MG /0.5ML auto-injectors has been APPROVED from 05/24/24 to 05/25/27    It looks like her pharmacy may have filled Ozempic  so I couldn't get a copay, if her pharmacy has issues filling Mounjaro then they must call her insurance and request a therapy change override.  PA #/Case ID/Reference #: [id number]

## 2024-05-24 NOTE — Telephone Encounter (Signed)
 Pharmacy Patient Advocate Encounter   Received notification from Onbase that prior authorization for Mounjaro 5MG /0.5ML auto-injectors is required/requested.   Insurance verification completed.   The patient is insured through [facility].   Per test claim: PA required; PA started via CoverMyMeds. KEY [id number] . Waiting for clinical questions to populate.

## 2024-05-27 NOTE — Telephone Encounter (Signed)
 Requested medication (s) are due for refill today: na   Requested medication (s) are on the active medication list: yes   Last refill:  05/23/24 #2 ml 4 refills  Future visit scheduled: yes 07/08/24  Notes to clinic:  medication not assigned a protocol. Pharmacy comment: Alternative Requested:PA IN PROCESS.  Do you want to refill Rx?     Requested Prescriptions  Pending Prescriptions Disp Refills   MOUNJARO 5 MG/0.5ML Pen [Pharmacy Med Name: MOUNJARO 5 MG/0.5 ML PEN]  4    Sig: INJECT 5 MG SUBCUTANEOUSLY WEEKLY     Off-Protocol Failed - 05/27/2024  2:33 PM      Failed - Medication not assigned to a protocol, review manually.      Passed - Valid encounter within last 12 months    Recent Outpatient Visits           4 days ago Controlled type 2 diabetes with neuropathy (HCC)   [facility] [place], Melanie T, NP   6 months ago Controlled type 2 diabetes with neuropathy [facility])   [facility] [place], Melanie DASEN, NP

## 2024-05-29 NOTE — Progress Notes (Signed)
 Scheduled

## 2024-06-25 ENCOUNTER — Other Ambulatory Visit (INDEPENDENT_AMBULATORY_CARE_PROVIDER_SITE_OTHER)

## 2024-06-25 DIAGNOSIS — R7989 Other specified abnormal findings of blood chemistry: Secondary | ICD-10-CM

## 2024-06-26 ENCOUNTER — Ambulatory Visit: Payer: Self-pay | Admitting: Nurse Practitioner

## 2024-06-26 LAB — CBC WITH DIFFERENTIAL/PLATELET
Basophils Absolute: 0.1 x10E3/uL (ref 0.0–0.2)
Basos: 1 %
EOS (ABSOLUTE): 0.3 x10E3/uL (ref 0.0–0.4)
Eos: 4 %
Hematocrit: 41.6 % (ref 34.0–46.6)
Hemoglobin: 13.4 g/dL (ref 11.1–15.9)
Immature Grans (Abs): 0 x10E3/uL (ref 0.0–0.1)
Immature Granulocytes: 0 %
Lymphocytes Absolute: 2.1 x10E3/uL (ref 0.7–3.1)
Lymphs: 32 %
MCH: 32.8 pg (ref 26.6–33.0)
MCHC: 32.2 g/dL (ref 31.5–35.7)
MCV: 102 fL — ABNORMAL HIGH (ref 79–97)
Monocytes Absolute: 0.6 x10E3/uL (ref 0.1–0.9)
Monocytes: 10 %
Neutrophils Absolute: 3.5 x10E3/uL (ref 1.4–7.0)
Neutrophils: 52 %
Platelets: 272 x10E3/uL (ref 150–450)
RBC: 4.09 x10E6/uL (ref 3.77–5.28)
RDW: 12.4 % (ref 11.7–15.4)
WBC: 6.5 x10E3/uL (ref 3.4–10.8)

## 2024-06-26 NOTE — Progress Notes (Signed)
 Contacted via MyChart  White blood cell count back to normal this check. Great news. I heard you have not been able to get Mounjaro. Did you get a denial letter?

## 2024-07-08 ENCOUNTER — Ambulatory Visit: Admitting: Nurse Practitioner

## 2024-08-13 ENCOUNTER — Other Ambulatory Visit: Payer: Self-pay | Admitting: Nurse Practitioner
